# Patient Record
Sex: Female | Born: 1955
Health system: Southern US, Community
[De-identification: ages and names within clinical notes are randomized; demographics above are authoritative.]

## PROBLEM LIST (undated history)

## (undated) DIAGNOSIS — N6320 Unspecified lump in the left breast, unspecified quadrant: Secondary | ICD-10-CM

## (undated) DIAGNOSIS — I499 Cardiac arrhythmia, unspecified: Secondary | ICD-10-CM

## (undated) DIAGNOSIS — G4730 Sleep apnea, unspecified: Secondary | ICD-10-CM

## (undated) DIAGNOSIS — R0902 Hypoxemia: Secondary | ICD-10-CM

## (undated) DIAGNOSIS — H269 Unspecified cataract: Secondary | ICD-10-CM

## (undated) DIAGNOSIS — F419 Anxiety disorder, unspecified: Secondary | ICD-10-CM

## (undated) DIAGNOSIS — M2540 Effusion, unspecified joint: Secondary | ICD-10-CM

## (undated) DIAGNOSIS — R519 Headache, unspecified: Secondary | ICD-10-CM

## (undated) DIAGNOSIS — R922 Inconclusive mammogram: Secondary | ICD-10-CM

## (undated) DIAGNOSIS — M2550 Pain in unspecified joint: Secondary | ICD-10-CM

## (undated) DIAGNOSIS — R51 Headache: Secondary | ICD-10-CM

## (undated) DIAGNOSIS — G4700 Insomnia, unspecified: Secondary | ICD-10-CM

## (undated) DIAGNOSIS — M1990 Unspecified osteoarthritis, unspecified site: Secondary | ICD-10-CM

## (undated) DIAGNOSIS — K219 Gastro-esophageal reflux disease without esophagitis: Secondary | ICD-10-CM

## (undated) DIAGNOSIS — H8109 Meniere's disease, unspecified ear: Secondary | ICD-10-CM

## (undated) DIAGNOSIS — E7800 Pure hypercholesterolemia, unspecified: Secondary | ICD-10-CM

## (undated) HISTORY — DX: Insomnia, unspecified: G47.00

## (undated) HISTORY — DX: Pain in unspecified joint: M25.50

## (undated) HISTORY — PX: CHOLECYSTECTOMY: SHX55

## (undated) HISTORY — DX: Unspecified lump in the left breast, unspecified quadrant: N63.20

## (undated) HISTORY — PX: FRACTURE SURGERY: SHX138

## (undated) HISTORY — DX: Sleep apnea, unspecified: G47.30

## (undated) HISTORY — DX: Unspecified osteoarthritis, unspecified site: M19.90

## (undated) HISTORY — DX: Hypoxemia: R09.02

## (undated) HISTORY — DX: Inconclusive mammogram: R92.2

## (undated) HISTORY — PX: TOTAL SHOULDER REPLACEMENT: SUR1217

## (undated) HISTORY — PX: TUBAL LIGATION: SHX77

## (undated) HISTORY — PX: JOINT REPLACEMENT: SHX530

## (undated) HISTORY — DX: Unspecified cataract: H26.9

## (undated) HISTORY — DX: Pure hypercholesterolemia, unspecified: E78.00

## (undated) HISTORY — PX: ANKLE FRACTURE SURGERY: SHX122

## (undated) HISTORY — PX: BREAST LUMPECTOMY: SHX2

## (undated) HISTORY — DX: Effusion, unspecified joint: M25.40

---

## 2005-01-18 ENCOUNTER — Ambulatory Visit: Payer: Self-pay | Admitting: Unknown Physician Specialty

## 2005-02-09 ENCOUNTER — Ambulatory Visit: Payer: Self-pay | Admitting: Unknown Physician Specialty

## 2006-03-01 ENCOUNTER — Ambulatory Visit: Payer: Self-pay | Admitting: Unknown Physician Specialty

## 2007-01-31 DIAGNOSIS — E7800 Pure hypercholesterolemia, unspecified: Secondary | ICD-10-CM | POA: Insufficient documentation

## 2007-01-31 DIAGNOSIS — R000 Tachycardia, unspecified: Secondary | ICD-10-CM | POA: Insufficient documentation

## 2007-01-31 DIAGNOSIS — H81319 Aural vertigo, unspecified ear: Secondary | ICD-10-CM | POA: Insufficient documentation

## 2007-01-31 DIAGNOSIS — Z860100 Personal history of colon polyps, unspecified: Secondary | ICD-10-CM | POA: Insufficient documentation

## 2007-01-31 DIAGNOSIS — G43019 Migraine without aura, intractable, without status migrainosus: Secondary | ICD-10-CM | POA: Insufficient documentation

## 2007-01-31 HISTORY — DX: Pure hypercholesterolemia, unspecified: E78.00

## 2007-02-28 DIAGNOSIS — F32A Depression, unspecified: Secondary | ICD-10-CM | POA: Insufficient documentation

## 2007-03-21 ENCOUNTER — Ambulatory Visit: Payer: Self-pay | Admitting: Gastroenterology

## 2007-04-04 ENCOUNTER — Ambulatory Visit: Payer: Self-pay | Admitting: Unknown Physician Specialty

## 2010-03-17 ENCOUNTER — Ambulatory Visit: Payer: Self-pay | Admitting: Unknown Physician Specialty

## 2010-08-22 ENCOUNTER — Inpatient Hospital Stay: Payer: Self-pay | Admitting: Orthopedic Surgery

## 2010-12-20 ENCOUNTER — Emergency Department: Payer: Self-pay | Admitting: Emergency Medicine

## 2010-12-24 ENCOUNTER — Ambulatory Visit: Payer: Self-pay | Admitting: Internal Medicine

## 2013-07-17 ENCOUNTER — Ambulatory Visit: Payer: Self-pay | Admitting: Family Medicine

## 2013-07-31 ENCOUNTER — Ambulatory Visit: Payer: Self-pay | Admitting: Family Medicine

## 2014-04-23 LAB — HM PAP SMEAR: HM Pap smear: NEGATIVE

## 2014-07-08 LAB — HM COLONOSCOPY

## 2015-03-11 ENCOUNTER — Ambulatory Visit: Payer: Self-pay | Admitting: Podiatry

## 2015-03-19 ENCOUNTER — Ambulatory Visit: Payer: Self-pay | Admitting: Podiatry

## 2015-03-19 ENCOUNTER — Encounter: Payer: Self-pay | Admitting: Podiatry

## 2015-03-19 ENCOUNTER — Ambulatory Visit (INDEPENDENT_AMBULATORY_CARE_PROVIDER_SITE_OTHER): Payer: BLUE CROSS/BLUE SHIELD | Admitting: Podiatry

## 2015-03-19 ENCOUNTER — Ambulatory Visit (INDEPENDENT_AMBULATORY_CARE_PROVIDER_SITE_OTHER): Payer: BLUE CROSS/BLUE SHIELD

## 2015-03-19 VITALS — BP 110/69 | HR 91 | Resp 18

## 2015-03-19 DIAGNOSIS — M25572 Pain in left ankle and joints of left foot: Secondary | ICD-10-CM | POA: Diagnosis not present

## 2015-03-19 DIAGNOSIS — M7662 Achilles tendinitis, left leg: Secondary | ICD-10-CM

## 2015-03-19 MED ORDER — MELOXICAM 7.5 MG PO TABS: 7.5000 mg | ORAL_TABLET | Freq: Every day | ORAL | Status: DC

## 2015-03-19 NOTE — Progress Notes (Signed)
   Subjective:    Patient ID: Elaine Melendez, female    DOB: 05-06-1956, 59 y.o.   MRN: [id number]  HPI 59 year old female presents the office today for concerns of pain to her left ankle which started naproxen 2 weeks ago. She states that over the last couple weeks when the pain started her pain is significantly improved and she does not have much discomfort at this time however she'll have the area checked. She points to the Achilles tendon where she had the majority of her symptoms. She states that she was elevating the area as well as applying heat which help decrease the pain. She currently states that she'll he has mild pain to the area. She denies any history of injury or trauma. She denies any detailing or numbness. No other complaints at this time.   Review of Systems  Constitutional: Positive for fatigue.  HENT:       Dizziness Hearing loss or ringing   Musculoskeletal:       Swelling of the feet  All other systems reviewed and are negative.      Objective:   Physical Exam AAO x3, NAD DP/PT pulses palpable bilaterally, CRT less than 3 seconds Protective sensation intact with Simms Weinstein monofilament, vibratory sensation intact, Achilles tendon reflex intact There is mild discomfort along the Achilles tendon or along the midsubstance of the tendon. There is no tenderness to patient on the insertion of the calcaneus. There is no defect noted. Thompson test is negative. There is no overlying edema, erythema, increase in warmth. Ankle, subtalar, midtarsal joint range of motion is intact. There is no areas of pinpoint bony tenderness or pain the vibratory sensation. There is no overlying edema, erythema, increase in warmth. There is a decrease in medial arch height upon weightbearing. No other areas of tenderness to bilateral lower extremities. MMT 5/5, ROM WNL.  No open lesions or pre-ulcerative lesions.  No pain with calf compression, swelling, warmth, erythema bilaterally.       Assessment & Plan:   59 year old female left Achilles tendinitis  -X-rays were obtained and reviewed with the patient.  -Treatment options discussed including all alternatives, risks, and complications -Discussed etiology of her symptoms.  -Discussed stretching/rehabilitation exercises to help strengthen the Achilles tendon.  Prescribed mobic. Discussed side effects of the medication and directed to stop if any are to occur and call the office.  -Discussed shoe gear modifications.  -Follow-up in 4 weeks if symptoms continue or sooner if any problems arise. In the meantime, encouraged to call the office with any questions, concerns, change in symptoms.   Celesta Gentile, DPM

## 2015-03-19 NOTE — Patient Instructions (Signed)
Achilles Tendinitis   with Rehab  Achilles tendinitis is a disorder of the Achilles tendon. The Achilles tendon connects the large calf muscles (Gastrocnemius and Soleus) to the heel bone (calcaneus). This tendon is sometimes called the heel cord. It is important for pushing-off and standing on your toes and is important for walking, running, or jumping. Tendinitis is often caused by overuse and repetitive microtrauma.  SYMPTOMS  · Pain, tenderness, swelling, warmth, and redness may occur over the Achilles tendon even at rest.  · Pain with pushing off, or flexing or extending the ankle.  · Pain that is worsened after or during activity.  CAUSES   · Overuse sometimes seen with rapid increase in exercise programs or in sports requiring running and jumping.  · Poor physical conditioning (strength and flexibility or endurance).  · Running sports, especially training running down hills.  · Inadequate warm-up before practice or play or failure to stretch before participation.  · Injury to the tendon.  PREVENTION   · Warm up and stretch before practice or competition.  · Allow time for adequate rest and recovery between practices and competition.  · Keep up conditioning.  ¨ Keep up ankle and leg flexibility.  ¨ Improve or keep muscle strength and endurance.  ¨ Improve cardiovascular fitness.  · Use proper technique.  · Use proper equipment (shoes, skates).  · To help prevent recurrence, taping, protective strapping, or an adhesive bandage may be recommended for several weeks after healing is complete.  PROGNOSIS   · Recovery may take weeks to several months to heal.  · Longer recovery is expected if symptoms have been prolonged.  · Recovery is usually quicker if the inflammation is due to a direct blow as compared with overuse or sudden strain.  RELATED COMPLICATIONS   · Healing time will be prolonged if the condition is not correctly treated. The injury must be given plenty of time to heal.  · Symptoms can reoccur if  activity is resumed too soon.  · Untreated, tendinitis may increase the risk of tendon rupture requiring additional time for recovery and possibly surgery.  TREATMENT   · The first treatment consists of rest anti-inflammatory medication, and ice to relieve the pain.  · Stretching and strengthening exercises after resolution of pain will likely help reduce the risk of recurrence. Referral to a physical therapist or athletic trainer for further evaluation and treatment may be helpful.  · A walking boot or cast may be recommended to rest the Achilles tendon. This can help break the cycle of inflammation and microtrauma.  · Arch supports (orthotics) may be prescribed or recommended by your caregiver as an adjunct to therapy and rest.  · Surgery to remove the inflamed tendon lining or degenerated tendon tissue is rarely necessary and has shown less than predictable results.  MEDICATION   · Nonsteroidal anti-inflammatory medications, such as aspirin and ibuprofen, may be used for pain and inflammation relief. Do not take within 7 days before surgery. Take these as directed by your caregiver. Contact your caregiver immediately if any bleeding, stomach upset, or signs of allergic reaction occur. Other minor pain relievers, such as acetaminophen, may also be used.  · Pain relievers may be prescribed as necessary by your caregiver. Do not take prescription pain medication for longer than 4 to 7 days. Use only as directed and only as much as you need.  · Cortisone injections are rarely indicated. Cortisone injections may weaken tendons and predispose to rupture. It is better   to give the condition more time to heal than to use them.  HEAT AND COLD  · Cold is used to relieve pain and reduce inflammation for acute and chronic Achilles tendinitis. Cold should be applied for 10 to 15 minutes every 2 to 3 hours for inflammation and pain and immediately after any activity that aggravates your symptoms. Use ice packs or an ice  massage.  · Heat may be used before performing stretching and strengthening activities prescribed by your caregiver. Use a heat pack or a warm soak.  SEEK MEDICAL CARE IF:  · Symptoms get worse or do not improve in 2 weeks despite treatment.  · New, unexplained symptoms develop. Drugs used in treatment may produce side effects.  EXERCISES  RANGE OF MOTION (ROM) AND STRETCHING EXERCISES - Achilles Tendinitis   These exercises may help you when beginning to rehabilitate your injury. Your symptoms may resolve with or without further involvement from your physician, physical therapist or athletic trainer. While completing these exercises, remember:   · Restoring tissue flexibility helps normal motion to return to the joints. This allows healthier, less painful movement and activity.  · An effective stretch should be held for at least 30 seconds.  · A stretch should never be painful. You should only feel a gentle lengthening or release in the stretched tissue.  STRETCH - Gastroc, Standing   · Place hands on wall.  · Extend right / left leg, keeping the front knee somewhat bent.  · Slightly point your toes inward on your back foot.  · Keeping your right / left heel on the floor and your knee straight, shift your weight toward the wall, not allowing your back to arch.  · You should feel a gentle stretch in the right / left calf. Hold this position for __________ seconds.  Repeat __________ times. Complete this stretch __________ times per day.  STRETCH - Soleus, Standing   · Place hands on wall.  · Extend right / left leg, keeping the other knee somewhat bent.  · Slightly point your toes inward on your back foot.  · Keep your right / left heel on the floor, bend your back knee, and slightly shift your weight over the back leg so that you feel a gentle stretch deep in your back calf.  · Hold this position for __________ seconds.  Repeat __________ times. Complete this stretch __________ times per day.  STRETCH -  Gastrocsoleus, Standing   Note: This exercise can place a lot of stress on your foot and ankle. Please complete this exercise only if specifically instructed by your caregiver.   · Place the ball of your right / left foot on a step, keeping your other foot firmly on the same step.  · Hold on to the wall or a rail for balance.  · Slowly lift your other foot, allowing your body weight to press your heel down over the edge of the step.  · You should feel a stretch in your right / left calf.  · Hold this position for __________ seconds.  · Repeat this exercise with a slight bend in your knee.  Repeat __________ times. Complete this stretch __________ times per day.   STRENGTHENING EXERCISES - Achilles Tendinitis  These exercises may help you when beginning to rehabilitate your injury. They may resolve your symptoms with or without further involvement from your physician, physical therapist or athletic trainer. While completing these exercises, remember:   · Muscles can gain both the endurance   and the strength needed for everyday activities through controlled exercises.  · Complete these exercises as instructed by your physician, physical therapist or athletic trainer. Progress the resistance and repetitions only as guided.  · You may experience muscle soreness or fatigue, but the pain or discomfort you are trying to eliminate should never worsen during these exercises. If this pain does worsen, stop and make certain you are following the directions exactly. If the pain is still present after adjustments, discontinue the exercise until you can discuss the trouble with your clinician.  STRENGTH - Plantar-flexors   · Sit with your right / left leg extended. Holding onto both ends of a rubber exercise band/tubing, loop it around the ball of your foot. Keep a slight tension in the band.  · Slowly push your toes away from you, pointing them downward.  · Hold this position for __________ seconds. Return slowly, controlling the  tension in the band/tubing.  Repeat __________ times. Complete this exercise __________ times per day.   STRENGTH - Plantar-flexors   · Stand with your feet shoulder width apart. Steady yourself with a wall or table using as little support as needed.  · Keeping your weight evenly spread over the width of your feet, rise up on your toes.*  · Hold this position for __________ seconds.  Repeat __________ times. Complete this exercise __________ times per day.   *If this is too easy, shift your weight toward your right / left leg until you feel challenged. Ultimately, you may be asked to do this exercise with your right / left foot only.  STRENGTH - Plantar-flexors, Eccentric   Note: This exercise can place a lot of stress on your foot and ankle. Please complete this exercise only if specifically instructed by your caregiver.   · Place the balls of your feet on a step. With your hands, use only enough support from a wall or rail to keep your balance.  · Keep your knees straight and rise up on your toes.  · Slowly shift your weight entirely to your right / left toes and pick up your opposite foot. Gently and with controlled movement, lower your weight through your right / left foot so that your heel drops below the level of the step. You will feel a slight stretch in the back of your calf at the end position.  · Use the healthy leg to help rise up onto the balls of both feet, then lower weight only on the right / left leg again. Build up to 15 repetitions. Then progress to 3 consecutive sets of 15 repetitions.*  · After completing the above exercise, complete the same exercise with a slight knee bend (about 30 degrees). Again, build up to 15 repetitions. Then progress to 3 consecutive sets of 15 repetitions.*  Perform this exercise __________ times per day.   *When you easily complete 3 sets of 15, your physician, physical therapist or athletic trainer may advise you to add resistance by wearing a backpack filled with  additional weight.  STRENGTH - Plantar Flexors, Seated   · Sit on a chair that allows your feet to rest flat on the ground. If necessary, sit at the edge of the chair.  · Keeping your toes firmly on the ground, lift your right / left heel as far as you can without increasing any discomfort in your ankle.  Repeat __________ times. Complete this exercise __________ times a day.  *If instructed by your physician, physical therapist or athletic   trainer, you may add ____________________ of resistance by placing a weighted object on your right / left knee.  Document Released: 03/02/2005 Document Revised: 10/24/2011 Document Reviewed: 11/13/2008  Exit[employer]® Patient Information ©2015 [employer]. This information is not intended to replace advice given to you by your health care provider. Make sure you discuss any questions you have with your health care provider.

## 2015-04-16 ENCOUNTER — Ambulatory Visit: Payer: BLUE CROSS/BLUE SHIELD | Admitting: Podiatry

## 2016-01-05 DIAGNOSIS — R51 Headache: Secondary | ICD-10-CM | POA: Diagnosis not present

## 2016-01-05 DIAGNOSIS — G43719 Chronic migraine without aura, intractable, without status migrainosus: Secondary | ICD-10-CM | POA: Diagnosis not present

## 2016-01-05 DIAGNOSIS — G43019 Migraine without aura, intractable, without status migrainosus: Secondary | ICD-10-CM | POA: Diagnosis not present

## 2016-01-05 DIAGNOSIS — Z79899 Other long term (current) drug therapy: Secondary | ICD-10-CM | POA: Diagnosis not present

## 2016-03-28 DIAGNOSIS — L821 Other seborrheic keratosis: Secondary | ICD-10-CM | POA: Diagnosis not present

## 2016-03-28 DIAGNOSIS — L239 Allergic contact dermatitis, unspecified cause: Secondary | ICD-10-CM | POA: Diagnosis not present

## 2016-03-28 DIAGNOSIS — L820 Inflamed seborrheic keratosis: Secondary | ICD-10-CM | POA: Diagnosis not present

## 2016-03-28 DIAGNOSIS — D229 Melanocytic nevi, unspecified: Secondary | ICD-10-CM | POA: Diagnosis not present

## 2016-09-13 DIAGNOSIS — G43019 Migraine without aura, intractable, without status migrainosus: Secondary | ICD-10-CM | POA: Diagnosis not present

## 2016-09-13 DIAGNOSIS — G43719 Chronic migraine without aura, intractable, without status migrainosus: Secondary | ICD-10-CM | POA: Diagnosis not present

## 2016-12-01 DIAGNOSIS — H8109 Meniere's disease, unspecified ear: Secondary | ICD-10-CM | POA: Diagnosis not present

## 2016-12-01 DIAGNOSIS — R5381 Other malaise: Secondary | ICD-10-CM | POA: Diagnosis not present

## 2016-12-01 DIAGNOSIS — R0683 Snoring: Secondary | ICD-10-CM | POA: Diagnosis not present

## 2016-12-01 DIAGNOSIS — H90A22 Sensorineural hearing loss, unilateral, left ear, with restricted hearing on the contralateral side: Secondary | ICD-10-CM | POA: Diagnosis not present

## 2016-12-01 DIAGNOSIS — H6123 Impacted cerumen, bilateral: Secondary | ICD-10-CM | POA: Diagnosis not present

## 2016-12-01 DIAGNOSIS — R5383 Other fatigue: Secondary | ICD-10-CM | POA: Diagnosis not present

## 2016-12-07 DIAGNOSIS — R2681 Unsteadiness on feet: Secondary | ICD-10-CM | POA: Diagnosis not present

## 2016-12-07 DIAGNOSIS — R42 Dizziness and giddiness: Secondary | ICD-10-CM | POA: Diagnosis not present

## 2016-12-07 DIAGNOSIS — R2689 Other abnormalities of gait and mobility: Secondary | ICD-10-CM | POA: Diagnosis not present

## 2016-12-07 DIAGNOSIS — H8190 Unspecified disorder of vestibular function, unspecified ear: Secondary | ICD-10-CM | POA: Diagnosis not present

## 2016-12-19 DIAGNOSIS — R2681 Unsteadiness on feet: Secondary | ICD-10-CM | POA: Diagnosis not present

## 2016-12-19 DIAGNOSIS — R2689 Other abnormalities of gait and mobility: Secondary | ICD-10-CM | POA: Diagnosis not present

## 2016-12-19 DIAGNOSIS — R42 Dizziness and giddiness: Secondary | ICD-10-CM | POA: Diagnosis not present

## 2016-12-19 DIAGNOSIS — H8190 Unspecified disorder of vestibular function, unspecified ear: Secondary | ICD-10-CM | POA: Diagnosis not present

## 2016-12-26 DIAGNOSIS — H25813 Combined forms of age-related cataract, bilateral: Secondary | ICD-10-CM | POA: Diagnosis not present

## 2017-01-04 DIAGNOSIS — R2681 Unsteadiness on feet: Secondary | ICD-10-CM | POA: Diagnosis not present

## 2017-01-04 DIAGNOSIS — H8190 Unspecified disorder of vestibular function, unspecified ear: Secondary | ICD-10-CM | POA: Diagnosis not present

## 2017-01-04 DIAGNOSIS — R42 Dizziness and giddiness: Secondary | ICD-10-CM | POA: Diagnosis not present

## 2017-01-04 DIAGNOSIS — R2689 Other abnormalities of gait and mobility: Secondary | ICD-10-CM | POA: Diagnosis not present

## 2017-01-16 DIAGNOSIS — R2689 Other abnormalities of gait and mobility: Secondary | ICD-10-CM | POA: Diagnosis not present

## 2017-01-16 DIAGNOSIS — H8190 Unspecified disorder of vestibular function, unspecified ear: Secondary | ICD-10-CM | POA: Diagnosis not present

## 2017-01-16 DIAGNOSIS — R42 Dizziness and giddiness: Secondary | ICD-10-CM | POA: Diagnosis not present

## 2017-01-16 DIAGNOSIS — R2681 Unsteadiness on feet: Secondary | ICD-10-CM | POA: Diagnosis not present

## 2017-03-31 DIAGNOSIS — J069 Acute upper respiratory infection, unspecified: Secondary | ICD-10-CM | POA: Diagnosis not present

## 2017-04-04 ENCOUNTER — Encounter: Payer: Self-pay | Admitting: Family Medicine

## 2017-04-04 ENCOUNTER — Ambulatory Visit (INDEPENDENT_AMBULATORY_CARE_PROVIDER_SITE_OTHER): Payer: BLUE CROSS/BLUE SHIELD | Admitting: Family Medicine

## 2017-04-04 ENCOUNTER — Telehealth: Payer: Self-pay | Admitting: Family Medicine

## 2017-04-04 VITALS — BP 120/74 | HR 80 | Temp 97.5°F | Resp 16 | Wt 215.0 lb

## 2017-04-04 DIAGNOSIS — N6320 Unspecified lump in the left breast, unspecified quadrant: Secondary | ICD-10-CM

## 2017-04-04 DIAGNOSIS — B372 Candidiasis of skin and nail: Secondary | ICD-10-CM | POA: Diagnosis not present

## 2017-04-04 MED ORDER — NYSTATIN 100000 UNIT/GM EX POWD: Freq: Four times a day (QID) | CUTANEOUS | 1 refills | Status: DC

## 2017-04-04 NOTE — Patient Instructions (Signed)
Intertrigo Intertrigo is skin irritation (inflammation) that happens in warm, moist areas of the body. The irritation can cause a rash and make skin raw and itchy. The rash is usually pink or red. It happens mostly between folds of skin or where skin rubs together, such as:  Toes.  Armpits.  Groin.  Belly.  Breasts.  Buttocks.  This condition is not passed from person to person (is not contagious). Follow these instructions at home:  Keep the affected area clean and dry.  Do not scratch your skin.  Stay cool as much as possible. Use an air conditioner or fan, if you can.  Apply over-the-counter and prescription medicines only as told by your doctor.  If you were prescribed an antibiotic medicine, use it as told by your doctor. Do not stop using the antibiotic even if your condition starts to get better.  Keep all follow-up visits as told by your doctor. This is important. How is this prevented?  Stay at a healthy weight.  Keep your feet dry. This is very important if you have diabetes. Wear cotton or wool socks.  Take care of and protect the skin in your groin and butt area as told by your doctor.  Do not wear tight clothes. Wear clothes that: ? Are loose. ? Take away moisture from your body. ? Are made of cotton.  Wear a bra that gives good support, if needed.  Shower and dry yourself fully after being active.  Keep your blood sugar under control if you have diabetes. Contact a doctor if:  Your symptoms do not get better with treatment.  Your symptoms get worse or they spread.  You notice more redness and warmth.  You have a fever. This information is not intended to replace advice given to you by your health care provider. Make sure you discuss any questions you have with your health care provider. Document Released: 09/03/2010 Document Revised: 01/07/2016 Document Reviewed: 02/02/2015 Elsevier Interactive Patient Education  2018 [employer]  

## 2017-04-04 NOTE — Progress Notes (Signed)
Patient: Elaine Melendez Female    DOB: 02-04-1956   61 y.o.   MRN: [id number] Visit Date: 04/04/2017  Today's Provider: Lavon Paganini, MD   Chief Complaint  Patient presents with  . Breast Pain   Subjective:    HPI   Breast Pain Pt has noticed pain in her left breast x 4 days. She was exposed to strep the day prior her pain started (she saw ENT who tested for strep, which was negative). She did notice a rash on her breast prior to pain. The rash is red, streaky and itchy. The day after the rash the pain begun. The pain is described as "throbbing" and stabbing deep to nipple. She denies dimpling, nipple discharge, new breast mass (she has fibrocystic breasts). She has performed self breast exam, which was unchanged per pt. There is a family H/O breast cancer (maternal grandmother, paternal cousins, paternal aunt). Last mammogram was 07/28/2013 and was BI-RADS 2. Pt has tried Tylenol, without relief.  Pain is worse at night when not wearing a bra.  Allergies  Allergen Reactions  . Nickel      Current Outpatient Prescriptions:  .  meclizine (ANTIVERT) 12.5 MG tablet, Take 12.5 mg by mouth 3 (three) times daily as needed for dizziness., Disp: , Rfl:  .  potassium chloride (KLOR-CON) 20 MEQ packet, Take 20 mEq by mouth 2 (two) times daily., Disp: , Rfl:  .  promethazine (PHENERGAN) 25 MG tablet, Take 25 mg by mouth every 6 (six) hours as needed for nausea or vomiting., Disp: , Rfl:  .  triamterene-hydrochlorothiazide (DYAZIDE) 37.5-25 MG per capsule, Take 1 capsule by mouth daily., Disp: , Rfl:  .  venlafaxine (EFFEXOR) 37.5 MG tablet, Take 37.5 mg by mouth 2 (two) times daily., Disp: , Rfl:   Review of Systems  Constitutional: Negative for activity change, appetite change, chills, diaphoresis, fatigue, fever and unexpected weight change.  Respiratory: Negative.   Cardiovascular: Negative for chest pain, palpitations and leg swelling.  Endocrine: Negative.   Genitourinary:  Negative.        Breast pain  Skin: Positive for rash.    Social History  Substance Use Topics  . Smoking status: Never Smoker  . Smokeless tobacco: Never Used  . Alcohol use No   Objective:   BP 120/74 (BP Location: Left Arm, Patient Position: Sitting, Cuff Size: Large)   Pulse 80   Temp (!) 97.5 F (36.4 C) (Oral)   Resp 16   Wt 215 lb (97.5 kg)   LMP  (LMP Unknown)  Vitals:   04/04/17 1010  BP: 120/74  Pulse: 80  Resp: 16  Temp: (!) 97.5 F (36.4 C)  TempSrc: Oral  Weight: 215 lb (97.5 kg)     Physical Exam  Constitutional: She is oriented to person, place, and time. She appears well-developed and well-nourished. No distress.  HENT:  Head: Normocephalic and atraumatic.  Eyes: Conjunctivae are normal. No scleral icterus.  Cardiovascular: Normal rate and regular rhythm.   Pulmonary/Chest: Effort normal. No respiratory distress.  Breasts: right breast normal without mass, skin or nipple changes or axillary nodes, abnormal mass palpable deep to nipple of L breast that extends in the 9 oclock position, lactating, no erythema or tenderness, nipples normal.   Musculoskeletal: She exhibits no edema.  Neurological: She is alert and oriented to person, place, and time.  Skin: Skin is warm and dry.  Red rash under left breast c/w intertrigo  Psychiatric: She has a normal mood  and affect. Her behavior is normal.  Vitals reviewed.     Assessment & Plan:         1. Left breast lump - likely a cyst, but able to palpate a lump in L breast that is not present on R - MM DIAG BREAST TOMO BILATERAL - US BREAST COMPLETE UNI LEFT INC AXILLA - US BREAST COMPLETE UNI RIGHT INC AXILLA - f/u with results  2. Candidal intertrigo - rash c/w intertrigo - does not seem related to breast pain - treat with nystatin powder topically until resolution - return precautions discussed   The entirety of the information documented in the History of Present Illness, Review of Systems and  Physical Exam were personally obtained by me. Portions of this information were initially documented by Raquel Sarna Ratchford, CMA and reviewed by me for thoroughness and accuracy.    Lavon Paganini, MD  [facility]

## 2017-04-04 NOTE — Telephone Encounter (Signed)
Updated orders placed. 

## 2017-04-04 NOTE — Telephone Encounter (Signed)
[facility] is requesting that complete breast ultrasounds be changed to limited.[id number]

## 2017-04-11 DIAGNOSIS — G43019 Migraine without aura, intractable, without status migrainosus: Secondary | ICD-10-CM | POA: Diagnosis not present

## 2017-04-11 DIAGNOSIS — G43719 Chronic migraine without aura, intractable, without status migrainosus: Secondary | ICD-10-CM | POA: Diagnosis not present

## 2017-04-13 ENCOUNTER — Ambulatory Visit
Admission: RE | Admit: 2017-04-13 | Discharge: 2017-04-13 | Disposition: A | Discharge status: Home / Self Care | Payer: BLUE CROSS/BLUE SHIELD | Source: Ambulatory Visit | Attending: Family Medicine | Admitting: Family Medicine

## 2017-04-13 ENCOUNTER — Telehealth: Payer: Self-pay | Admitting: Family Medicine

## 2017-04-13 DIAGNOSIS — R509 Fever, unspecified: Secondary | ICD-10-CM | POA: Insufficient documentation

## 2017-04-13 DIAGNOSIS — Z803 Family history of malignant neoplasm of breast: Secondary | ICD-10-CM | POA: Diagnosis not present

## 2017-04-13 DIAGNOSIS — N6322 Unspecified lump in the left breast, upper inner quadrant: Secondary | ICD-10-CM | POA: Diagnosis not present

## 2017-04-13 DIAGNOSIS — R590 Localized enlarged lymph nodes: Secondary | ICD-10-CM | POA: Diagnosis not present

## 2017-04-13 DIAGNOSIS — N6320 Unspecified lump in the left breast, unspecified quadrant: Secondary | ICD-10-CM | POA: Insufficient documentation

## 2017-04-13 DIAGNOSIS — R922 Inconclusive mammogram: Secondary | ICD-10-CM | POA: Diagnosis not present

## 2017-04-13 DIAGNOSIS — N6324 Unspecified lump in the left breast, lower inner quadrant: Secondary | ICD-10-CM | POA: Diagnosis not present

## 2017-04-13 MED ORDER — AMOXICILLIN-POT CLAVULANATE 875-125 MG PO TABS: 1.0000 | ORAL_TABLET | Freq: Two times a day (BID) | ORAL | 0 refills | Status: DC

## 2017-04-13 NOTE — Telephone Encounter (Signed)
Pt advised and agrees with treatment plan. 2 week FU made.

## 2017-04-13 NOTE — Telephone Encounter (Signed)
Spoke to breast radiologist regarding mammogram and ultrasonography.  No discreet lumps found.  Enlarged lymph node noted in L axilla.  Wedge shaped area of redness found on R breast today.  Patient continues to have pain.  We discussed and will treat with course of antibiotics to treat for possible mastitis, which could explain enlarged lymph node as well.  Plan for repeat ultrasound including axilla in ~6 wks.  No biopsy at this time.  Could consider breast MRI in the future.  I will send a 10 day course of augmentin to pharmacy to treat for possible mastitis.  Please let patient know and schedule follow-up with me in 2 weeks for recheck.  Thanks!  Virginia Crews, MD, MPH [facility] 04/13/2017 3:28 PM

## 2017-04-27 ENCOUNTER — Encounter: Payer: Self-pay | Admitting: Family Medicine

## 2017-04-27 ENCOUNTER — Ambulatory Visit (INDEPENDENT_AMBULATORY_CARE_PROVIDER_SITE_OTHER): Payer: BLUE CROSS/BLUE SHIELD | Admitting: Family Medicine

## 2017-04-27 VITALS — BP 124/68 | HR 96 | Temp 97.5°F | Resp 16 | Wt 216.0 lb

## 2017-04-27 DIAGNOSIS — N61 Mastitis without abscess: Secondary | ICD-10-CM

## 2017-04-27 MED ORDER — SULFAMETHOXAZOLE-TRIMETHOPRIM 800-160 MG PO TABS: 1.0000 | ORAL_TABLET | Freq: Two times a day (BID) | ORAL | 0 refills | Status: DC

## 2017-04-27 NOTE — Patient Instructions (Signed)
Mastitis  Mastitis is inflammation of the breast tissue. It occurs most often in women who are breastfeeding, but it can also affect other women, and even sometimes men.  What are the causes?  Mastitis is usually caused by a bacterial infection. Bacteria enter the breast tissue through cuts or openings in the skin. Typically, this occurs with breastfeeding because of cracked or irritated skin. Sometimes, it can occur even when there is no opening in the skin. It can be associated with plugged milk (lactiferous) ducts. Nipple piercing can also lead to mastitis. Also, some forms of breast cancer can cause mastitis.  What are the signs or symptoms?  · Swelling, redness, tenderness, and pain in an area of the breast.  · Swelling of the glands under the arm on the same side.  · Fever.  If an infection is allowed to progress, a collection of pus (abscess) may develop.  How is this diagnosed?  Your health care provider can usually diagnose mastitis based on your symptoms and a physical exam. Tests may be done to help confirm the diagnosis. These may include:  · Removal of pus from the breast by applying pressure to the area. This pus can be examined in the lab to determine which bacteria are present. If an abscess has developed, the fluid in the abscess can be removed with a needle. This can also be used to confirm the diagnosis and determine the bacteria present. In most cases, pus will not be present.  · Blood tests to determine if your body is fighting a bacterial infection.  · Mammogram or ultrasound tests to rule out other problems or diseases.    How is this treated?  Antibiotic medicine is used to treat a bacterial infection. Your health care provider will determine which bacteria are most likely causing the infection and will select an appropriate antibiotic. This is sometimes changed based on the results of tests performed to identify the bacteria, or if there is no response to the antibiotic selected. Antibiotics  are usually given by mouth. You may also be given medicine for pain.  Mastitis that occurs with breastfeeding will sometimes go away on its own, so your health care provider may choose to wait 24 hours after first seeing you to decide whether a prescription medicine is needed.  Follow these instructions at home:  · Only take over-the-counter or prescription medicines for pain, fever, or discomfort as directed by your health care provider.  · If your health care provider prescribed an antibiotic, take the medicine as directed. Make sure you finish it even if you start to feel better.  · Do not wear a tight or underwire bra. Wear a soft, supportive bra.  · Increase your fluid intake, especially if you have a fever.  · Women who are breastfeeding should follow these instructions:  ? Continue to empty the breast. Your health care provider can tell you whether this milk is safe for your infant or needs to be thrown out. You may be told to stop nursing until your health care provider thinks it is safe for your baby. Use a breast pump if you are advised to stop nursing.  ? Keep your nipples clean and dry.  ? Empty the first breast completely before going to the other breast. If your baby is not emptying your breasts completely for some reason, use a breast pump to empty your breasts.  ? If you go back to work, pump your breasts while at work to stay   in time with your nursing schedule.  ? Avoid allowing your breasts to become overly filled with milk (engorged).  Contact a health care provider if:  · You have pus-like discharge from the breast.  · Your symptoms do not improve with the treatment prescribed by your health care provider within 2 days.  Get help right away if:  · Your pain and swelling are getting worse.  · You have pain that is not controlled with medicine.  · You have a red line extending from the breast toward your armpit.  · You have a fever or persistent symptoms for more than 2-3 days.  · You have a fever  and your symptoms suddenly get worse.  This information is not intended to replace advice given to you by your health care provider. Make sure you discuss any questions you have with your health care provider.  Document Released: 08/01/2005 Document Revised: 01/07/2016 Document Reviewed: 03/01/2013  Elsevier Interactive Patient Education © 2017 Elsevier Inc.

## 2017-04-27 NOTE — Progress Notes (Signed)
Patient: Elaine Melendez Female    DOB: [birth year]   61 y.o.   MRN: [id number] Visit Date: 04/27/2017  Today's Provider: Lavon Paganini, MD   Chief Complaint  Patient presents with  . Mastitis   Subjective:    HPI     Follow up for Mastitis  The patient was last seen for a likely cyst in her left breast 2 weeks ago. Changes made at last visit include ordering imaging, which showed an enlarged lymph node of the left axilla. Pt was treated with Augmentin for possible mastitis. Radiologist recommended a repeat US including axilla in 6 months.  She reports excellent compliance with treatment. She feels that condition is Unchanged. She has also noticed a red rash on her right breast that has been present since the night prior to the mammogram (this might be slightly better). She denies pain, itchiness.  She is having side effects. She states the abx caused yeast, but she used cortisone cream, with relief.  ------------------------------------------------------------------------------------     Allergies  Allergen Reactions  . Nickel      Current Outpatient Prescriptions:  .  meclizine (ANTIVERT) 12.5 MG tablet, Take 12.5 mg by mouth 3 (three) times daily as needed for dizziness., Disp: , Rfl:  .  nystatin (MYCOSTATIN/NYSTOP) powder, Apply topically 4 (four) times daily. To rash under breast, Disp: 15 g, Rfl: 1 .  Probiotic Product (PROBIOTIC PO), Take by mouth. AZO, Disp: , Rfl:  .  promethazine (PHENERGAN) 25 MG tablet, Take 25 mg by mouth every 6 (six) hours as needed for nausea or vomiting., Disp: , Rfl:  .  venlafaxine (EFFEXOR) 37.5 MG tablet, Take 37.5 mg by mouth 2 (two) times daily., Disp: , Rfl:  .  potassium chloride (KLOR-CON) 20 MEQ packet, Take 20 mEq by mouth 2 (two) times daily., Disp: , Rfl:  .  triamterene-hydrochlorothiazide (DYAZIDE) 37.5-25 MG per capsule, Take 1 capsule by mouth daily., Disp: , Rfl:   Review of Systems  Constitutional: Negative for  activity change, appetite change, chills, diaphoresis, fatigue, fever and unexpected weight change.  Respiratory: Negative for cough.   Cardiovascular: Negative for chest pain, palpitations and leg swelling.  Skin: Positive for rash.    Social History  Substance Use Topics  . Smoking status: Never Smoker  . Smokeless tobacco: Never Used  . Alcohol use No   Objective:   BP 124/68 (BP Location: Left Arm, Patient Position: Sitting, Cuff Size: Large)   Pulse 96   Temp (!) 97.5 F (36.4 C) (Oral)   Resp 16   Wt 216 lb (98 kg)   LMP  (LMP Unknown)  Vitals:   04/27/17 1602  BP: 124/68  Pulse: 96  Resp: 16  Temp: (!) 97.5 F (36.4 C)  TempSrc: Oral  Weight: 216 lb (98 kg)     Physical Exam  Constitutional: She is oriented to person, place, and time. She appears well-developed and well-nourished. No distress.  HENT:  Head: Normocephalic and atraumatic.  Cardiovascular: Normal rate and regular rhythm.   Pulmonary/Chest: Effort normal. No respiratory distress.  Breasts: breasts very dense.  No palpable lumps or axillary LAD.  Wedge shaped erythema with warmth on medial R breast without tenderness.   Neurological: She is alert and oriented to person, place, and time.  Skin: Skin is warm and dry.  Psychiatric: She has a normal mood and affect. Her behavior is normal.  Vitals reviewed.       Assessment & Plan:  1. Mastitis - failed treatment with augmentin - some concern for possible inflammatory breast cancer - will try 2nd course of abx therapy with bactrim to see if coverage was missed with augmentin - f/u after finishing abx and consider breast MRI - patient is scheduled for f/u US of L axilla LAD in 4 more weeks  Meds ordered this encounter  Medications  . Probiotic Product (PROBIOTIC PO)    Sig: Take by mouth. AZO  . sulfamethoxazole-trimethoprim (BACTRIM DS,SEPTRA DS) 800-160 MG tablet    Sig: Take 1 tablet by mouth 2 (two) times daily.    Dispense:  20  tablet    Refill:  0        The entirety of the information documented in the History of Present Illness, Review of Systems and Physical Exam were personally obtained by me. Portions of this information were initially documented by Raquel Sarna Ratchford, CMA and reviewed by me for thoroughness and accuracy.     Lavon Paganini, MD  [facility]

## 2017-05-10 ENCOUNTER — Ambulatory Visit (INDEPENDENT_AMBULATORY_CARE_PROVIDER_SITE_OTHER): Payer: BLUE CROSS/BLUE SHIELD | Admitting: Family Medicine

## 2017-05-10 ENCOUNTER — Encounter: Payer: Self-pay | Admitting: Family Medicine

## 2017-05-10 DIAGNOSIS — G4700 Insomnia, unspecified: Secondary | ICD-10-CM | POA: Insufficient documentation

## 2017-05-10 DIAGNOSIS — L539 Erythematous condition, unspecified: Secondary | ICD-10-CM | POA: Diagnosis not present

## 2017-05-10 HISTORY — DX: Insomnia, unspecified: G47.00

## 2017-05-10 NOTE — Progress Notes (Signed)
Patient: Elaine Melendez Female    DOB: 11/11/1955   61 y.o.   MRN: [id number] Visit Date: 05/10/2017  Today's Provider: Lavon Paganini, MD   Chief Complaint  Patient presents with  . Mastitis   Subjective:    HPI     Follow up for Mastitis  The patient was last seen for this 2 weeks ago. Changes made at last visit include starting second course of abx (bactrim) after failing Augmentin. Pt was also started on Nystatin powder for the affected area of intertrigo underneath breasts.  She reports excellent compliance with treatment. She has finished these medications. She feels that condition is Improved. She states the area is about 50% improved in the color and depth of redness.  No change in size of erythematous area.  No nipple discharge, breast pain, dimpling of skin. She is not having side effects. Has f/u appt with radiology for repeat L axillary [place] for lymphadenopathy on 10/11. ------------------------------------------------------------------------------------    Allergies  Allergen Reactions  . Nickel      Current Outpatient Prescriptions:  .  meclizine (ANTIVERT) 12.5 MG tablet, Take 12.5 mg by mouth 3 (three) times daily as needed for dizziness., Disp: , Rfl:  .  nystatin (MYCOSTATIN/NYSTOP) powder, Apply topically 4 (four) times daily. To rash under breast, Disp: 15 g, Rfl: 1 .  Probiotic Product (PROBIOTIC PO), Take by mouth. AZO, Disp: , Rfl:  .  promethazine (PHENERGAN) 25 MG tablet, Take 25 mg by mouth every 6 (six) hours as needed for nausea or vomiting., Disp: , Rfl:  .  venlafaxine (EFFEXOR) 37.5 MG tablet, Take 37.5 mg by mouth 2 (two) times daily., Disp: , Rfl:  .  potassium chloride (KLOR-CON) 20 MEQ packet, Take 20 mEq by mouth 2 (two) times daily., Disp: , Rfl:  .  triamterene-hydrochlorothiazide (DYAZIDE) 37.5-25 MG per capsule, Take 1 capsule by mouth daily., Disp: , Rfl:   Review of Systems  Constitutional: Negative for activity change,  appetite change, chills, diaphoresis, fatigue, fever and unexpected weight change.  HENT: Negative.   Respiratory: Negative.   Cardiovascular: Negative.   Gastrointestinal: Negative.   Genitourinary: Negative.   Skin: Positive for color change and rash.  Neurological: Negative.   Psychiatric/Behavioral: Negative.     Social History  Substance Use Topics  . Smoking status: Never Smoker  . Smokeless tobacco: Never Used  . Alcohol use No   Objective:   BP 136/74 (BP Location: Left Arm, Patient Position: Sitting, Cuff Size: Large)   Pulse 84   Temp 98 F (36.7 C) (Oral)   Resp 16   Wt 215 lb (97.5 kg)   LMP  (LMP Unknown)  Vitals:   05/10/17 1330  BP: 136/74  Pulse: 84  Resp: 16  Temp: 98 F (36.7 C)  TempSrc: Oral  Weight: 215 lb (97.5 kg)     Physical Exam  Constitutional: She is oriented to person, place, and time. She appears well-developed and well-nourished.  HENT:  Head: Normocephalic and atraumatic.  Eyes: Conjunctivae are normal. No scleral icterus.  Neck: Neck supple.  Cardiovascular: Normal rate, regular rhythm and normal heart sounds.   No murmur heard. Pulmonary/Chest: Effort normal and breath sounds normal. No respiratory distress. She has no wheezes. She has no rales.  Breasts:  left breast normal without mass, skin or nipple changes or axillary nodes. Right breast without mass, nipple changes, axillary nodes. Continues to have wedge-shaped area of erythema and is nontender to palpation over  medial right breast at the 3:00 position.  Musculoskeletal: She exhibits no edema.  Lymphadenopathy:    She has no cervical adenopathy.  Neurological: She is alert and oriented to person, place, and time.  Skin: Skin is warm and dry. No rash noted.  Psychiatric: She has a normal mood and affect. Her behavior is normal.  Vitals reviewed.       Assessment & Plan:      Problem List Items Addressed This Visit      Other   Breast erythema    Erythema of right  breast is now failed to improve despite 2 courses of adequate antibiotics No orange peel appearance or nipple changes Some concern for inflammatory breast cancer given lack of change when treated adequately for mastitis No palpable mass and no abnormalities of the right breast found on recent mammogram She is getting repeat ultrasound for some left axillary adenopathy that was found during diagnostic ultrasound. If this is still present, biopsy is recommended I will refer her to a breast surgeon today to discuss possible biopsy of this erythematous area of her right breast      Relevant Orders   Ambulatory referral to General Surgery          The entirety of the information documented in the History of Present Illness, Review of Systems and Physical Exam were personally obtained by me. Portions of this information were initially documented by Raquel Sarna Ratchford, CMA and reviewed by me for thoroughness and accuracy.     Lavon Paganini, MD  [facility]

## 2017-05-10 NOTE — Patient Instructions (Signed)
Breast Biopsy  A breast biopsy is a procedure in which a sample of suspicious breast tissue is removed from your breast. Following the procedure, the tissue or liquid that is removed from the breast is examined under a microscope to see if cancerous cells are present. You may need a breast biopsy if you have:  · Any undiagnosed breast mass (tumor).  · Nipple abnormalities, dimpling, crusting, or ulcerations.  · Abnormal discharge from the nipple, especially blood.  · Redness, swelling, and pain of the breast.  · Calcium deposits (calcifications) or abnormalities seen on a mammogram, ultrasound results, or MRI results.  · Suspicious changes in the breast seen on your mammogram.    If the breast abnormality is found to be cancerous (malignant), a breast biopsy can help to determine what the best treatment is for you. There are many different types of breast biopsies. Talk with your health care provider about your options and which type is best for you.  Tell a health care provider about:  · Any allergies you have.  · All medicines you are taking, including vitamins, herbs, eye drops, creams, and over-the-counter medicines.  · Any problems you or family members have had with anesthetic medicines.  · Any blood disorders you have.  · Any surgeries you have had.  · Any medical conditions you have.  · Whether you are pregnant or may be pregnant.  What are the risks?  Generally, this is a safe procedure. However, problems may occur, including:  · Bleeding.  · Infection.  · Discomfort. This is temporary.  · Allergic reactions to medicines.  · Bruising and swelling of the breast.  · Alteration in the shape of the breast.  · Damage to other tissues.  · Not finding the lump or abnormality.  · Needing more surgery.    What happens before the procedure?  · Plan to have someone take you home after the procedure.  · Do not use any tobacco products, such as cigarettes, chewing tobacco, and e-cigarettes. If you need help quitting,  ask your health care provider.  · Do not drink alcohol for 24 hours before the procedure.  · Ask your health care provider about:  ? Changing or stopping your regular medicines. This is especially important if you are taking diabetes medicines or blood thinners.  ? Taking medicines such as aspirin and ibuprofen. These medicines can thin your blood. Do not take these medicines before your procedure if your health care provider instructs you not to.  · Wear a good support bra to the procedure.  · Ask your health care provider how your surgical site will be marked or identified.  · You may be given antibiotic medicine to help prevent infection.  · Your health care provider may perform a procedure to place a wire (needle localization) or a seed that gives off radiation (radioactive seed localization) in the breast lump. A mammogram, ultrasound, MRI, or a combination of these techniques will be done during this procedure to identify the location of the breast abnormality. The imaging technique used will depend on the type of biopsy you are having. The wire or seed will help the health care provider locate the lump when performing the biopsy, especially if the lump cannot be felt.  What happens during the procedure?  You may be given one or both of the following:  · A medicine to numb the breast area (local anesthetic).  · A medicine to help you relax (sedative) during the procedure.      The following are the different types of biopsies that can be performed.  Fine-Needle Aspiration  A thin needle will be attached to a syringe and inserted into a breast cyst. Fluid and cells will be removed. This technique is not as common as a core needle biopsy.  Core Needle Biopsy  A wide, hollow needle (core needle) will be inserted into a breast lump multiple times to remove tissue samples or cores.  Stereotactic Biopsy  You will lie face-down on a table. Your breast will pass through an opening in the table and will be gently  compressed into a fixed position. X-ray equipment and a computer will be used to locate the breast lump. The surgeon will use this information to collect several samples of tissue using a needle collection device.  Vacuum-Assisted Biopsy  A small incision (less than ¼ inch) will be made in your breast. A biopsy device that includes a hollow needle and vacuum will be passed through the incision and into the breast tissue. The vacuum will gently draw abnormal breast tissue into the needle to remove it. No stitches (sutures) will be needed. The incision will be covered with a bandage (dressing). In this type of biopsy, a larger tissue sample is removed than in a regular core needle biopsy.  Ultrasound-Guided Core Needle Biopsy  A high-frequency ultrasound will be used to help guide the core needle to the area of the mass or abnormality. An incision will be made to insert the needle. Then tissue samples will be removed.  Surgical Biopsy  This method requires an incision in the breast to remove part or all of the suspicious tissue. After the tissue is removed, the skin over the area will be closed with sutures and covered with a dressing. There are two types of surgical biopsies:  · Incisional biopsy. The surgeon will remove part of the breast lump.  · Excisional biopsy. The surgeon will attempt to remove the whole breast lump or as much of it as possible.    After any of these procedures, the tissue or liquid that was removed will be examined under a microscope.  What happens after the procedure?  · You will be taken to the recovery area. If you are doing well and have no problems, you will be allowed to go home.  · You may notice bruising on your breast. This is normal.  · You may have a pressure dressing applied on your breast for 24-48 hours. A pressure dressing is a bandage that is wrapped tightly around the chest to stop fluid from collecting underneath tissues. You may also be advised to wear a supportive bra  during this time.  · Do not drive for 24 hours if you received a sedative.  This information is not intended to replace advice given to you by your health care provider. Make sure you discuss any questions you have with your health care provider.  Document Released: 08/01/2005 Document Revised: 12/10/2015 Document Reviewed: 05/05/2015  Elsevier Interactive Patient Education © 2018 [employer] Inc[employer]

## 2017-05-10 NOTE — Assessment & Plan Note (Signed)
Erythema of right breast is now failed to improve despite 2 courses of adequate antibiotics No orange peel appearance or nipple changes Some concern for inflammatory breast cancer given lack of change when treated adequately for mastitis No palpable mass and no abnormalities of the right breast found on recent mammogram She is getting repeat ultrasound for some left axillary adenopathy that was found during diagnostic ultrasound. If this is still present, biopsy is recommended I will refer her to a breast surgeon today to discuss possible biopsy of this erythematous area of her right breast

## 2017-05-15 DIAGNOSIS — D126 Benign neoplasm of colon, unspecified: Secondary | ICD-10-CM | POA: Diagnosis not present

## 2017-05-15 DIAGNOSIS — N61 Mastitis without abscess: Secondary | ICD-10-CM | POA: Diagnosis not present

## 2017-05-16 ENCOUNTER — Encounter: Payer: Self-pay | Admitting: Family Medicine

## 2017-05-25 ENCOUNTER — Other Ambulatory Visit: Payer: Self-pay | Admitting: Family Medicine

## 2017-05-25 ENCOUNTER — Ambulatory Visit
Admission: RE | Admit: 2017-05-25 | Discharge: 2017-05-25 | Disposition: A | Discharge status: Home / Self Care | Payer: BLUE CROSS/BLUE SHIELD | Source: Ambulatory Visit | Attending: Family Medicine | Admitting: Family Medicine

## 2017-05-25 DIAGNOSIS — Z1239 Encounter for other screening for malignant neoplasm of breast: Secondary | ICD-10-CM

## 2017-05-25 DIAGNOSIS — R922 Inconclusive mammogram: Secondary | ICD-10-CM

## 2017-05-25 DIAGNOSIS — N6320 Unspecified lump in the left breast, unspecified quadrant: Secondary | ICD-10-CM

## 2017-05-25 DIAGNOSIS — N644 Mastodynia: Secondary | ICD-10-CM

## 2017-05-25 DIAGNOSIS — N6489 Other specified disorders of breast: Secondary | ICD-10-CM | POA: Diagnosis not present

## 2017-05-25 DIAGNOSIS — L539 Erythematous condition, unspecified: Secondary | ICD-10-CM

## 2017-05-25 DIAGNOSIS — R9230 Dense breasts, unspecified: Secondary | ICD-10-CM

## 2017-05-25 HISTORY — DX: Dense breasts, unspecified: R92.30

## 2017-05-25 HISTORY — DX: Inconclusive mammogram: R92.2

## 2017-06-05 ENCOUNTER — Telehealth: Payer: Self-pay | Admitting: Family Medicine

## 2017-06-05 DIAGNOSIS — L539 Erythematous condition, unspecified: Secondary | ICD-10-CM

## 2017-06-05 NOTE — Telephone Encounter (Signed)
L/M advising patricia as below.  

## 2017-06-05 NOTE — Telephone Encounter (Signed)
Please review. Thanks!  

## 2017-06-05 NOTE — Telephone Encounter (Signed)
Mardene Celeste with [facility] [phone] called stating an order would need to be put in for [facility] with/without contrast order.  Mardene Celeste states she will leave the office at 130 today however, you can give a verbal on her voicemail or just change in EPIC.

## 2017-06-05 NOTE — Telephone Encounter (Signed)
New order placed in Epic.  Please call Mardene Celeste back with FYI to make sure there is nothing else we need to do.  Virginia Crews, MD, MPH [facility] 06/05/2017 2:55 PM

## 2017-06-16 DIAGNOSIS — I890 Lymphedema, not elsewhere classified: Secondary | ICD-10-CM | POA: Diagnosis not present

## 2017-06-16 DIAGNOSIS — N61 Mastitis without abscess: Secondary | ICD-10-CM | POA: Diagnosis not present

## 2017-06-21 ENCOUNTER — Other Ambulatory Visit: Payer: Self-pay | Admitting: Family Medicine

## 2017-06-21 ENCOUNTER — Ambulatory Visit
Admission: RE | Admit: 2017-06-21 | Discharge: 2017-06-21 | Disposition: A | Discharge status: Home / Self Care | Payer: BLUE CROSS/BLUE SHIELD | Source: Ambulatory Visit | Attending: Family Medicine | Admitting: Family Medicine

## 2017-06-21 DIAGNOSIS — L539 Erythematous condition, unspecified: Secondary | ICD-10-CM

## 2017-06-21 DIAGNOSIS — N6320 Unspecified lump in the left breast, unspecified quadrant: Secondary | ICD-10-CM

## 2017-06-21 DIAGNOSIS — N6489 Other specified disorders of breast: Secondary | ICD-10-CM | POA: Diagnosis not present

## 2017-06-21 DIAGNOSIS — N6342 Unspecified lump in left breast, subareolar: Secondary | ICD-10-CM | POA: Insufficient documentation

## 2017-06-21 HISTORY — DX: Unspecified lump in the left breast, unspecified quadrant: N63.20

## 2017-06-21 MED ORDER — GADOBENATE DIMEGLUMINE 529 MG/ML IV SOLN
20.0000 mL | Freq: Once | INTRAVENOUS | Status: AC | PRN
  Administered 2017-06-21: 20 mL via INTRAVENOUS

## 2017-06-23 ENCOUNTER — Other Ambulatory Visit: Payer: Self-pay | Admitting: Family Medicine

## 2017-06-23 DIAGNOSIS — N6320 Unspecified lump in the left breast, unspecified quadrant: Secondary | ICD-10-CM

## 2017-06-30 ENCOUNTER — Ambulatory Visit
Admission: RE | Admit: 2017-06-30 | Discharge: 2017-06-30 | Disposition: A | Discharge status: Home / Self Care | Payer: BLUE CROSS/BLUE SHIELD | Source: Ambulatory Visit | Attending: Family Medicine | Admitting: Family Medicine

## 2017-06-30 DIAGNOSIS — N6320 Unspecified lump in the left breast, unspecified quadrant: Secondary | ICD-10-CM

## 2017-06-30 DIAGNOSIS — D242 Benign neoplasm of left breast: Secondary | ICD-10-CM | POA: Diagnosis not present

## 2017-06-30 DIAGNOSIS — N6092 Unspecified benign mammary dysplasia of left breast: Secondary | ICD-10-CM | POA: Diagnosis not present

## 2017-06-30 DIAGNOSIS — N6489 Other specified disorders of breast: Secondary | ICD-10-CM | POA: Diagnosis not present

## 2017-06-30 MED ORDER — GADOBENATE DIMEGLUMINE 529 MG/ML IV SOLN
20.0000 mL | Freq: Once | INTRAVENOUS | Status: AC | PRN
  Administered 2017-06-30: 20 mL via INTRAVENOUS

## 2017-08-25 ENCOUNTER — Other Ambulatory Visit: Payer: Self-pay | Admitting: General Surgery

## 2017-08-25 DIAGNOSIS — R928 Other abnormal and inconclusive findings on diagnostic imaging of breast: Secondary | ICD-10-CM | POA: Diagnosis not present

## 2017-09-06 ENCOUNTER — Other Ambulatory Visit: Payer: Self-pay

## 2017-09-06 ENCOUNTER — Encounter (HOSPITAL_BASED_OUTPATIENT_CLINIC_OR_DEPARTMENT_OTHER): Payer: Self-pay | Admitting: *Deleted

## 2017-09-06 DIAGNOSIS — D1801 Hemangioma of skin and subcutaneous tissue: Secondary | ICD-10-CM | POA: Diagnosis not present

## 2017-09-06 DIAGNOSIS — M25549 Pain in joints of unspecified hand: Secondary | ICD-10-CM | POA: Diagnosis not present

## 2017-09-06 DIAGNOSIS — L905 Scar conditions and fibrosis of skin: Secondary | ICD-10-CM | POA: Diagnosis not present

## 2017-09-07 ENCOUNTER — Encounter (HOSPITAL_BASED_OUTPATIENT_CLINIC_OR_DEPARTMENT_OTHER)
Admission: RE | Admit: 2017-09-07 | Discharge: 2017-09-07 | Disposition: A | Discharge status: Home / Self Care | Payer: BLUE CROSS/BLUE SHIELD | Source: Ambulatory Visit | Attending: General Surgery | Admitting: General Surgery

## 2017-09-07 DIAGNOSIS — R9431 Abnormal electrocardiogram [ECG] [EKG]: Secondary | ICD-10-CM | POA: Insufficient documentation

## 2017-09-07 DIAGNOSIS — R000 Tachycardia, unspecified: Secondary | ICD-10-CM | POA: Diagnosis not present

## 2017-09-07 LAB — BASIC METABOLIC PANEL
Anion gap: 10 (ref 5–15)
BUN: 8 mg/dL (ref 6–20)
CALCIUM: 9.2 mg/dL (ref 8.9–10.3)
CO2: 28 mmol/L (ref 22–32)
CREATININE: 0.65 mg/dL (ref 0.44–1.00)
Chloride: 103 mmol/L (ref 101–111)
GFR calc non Af Amer: >60 mL/min (ref >60)
Glucose, Bld: 116 mg/dL — ABNORMAL HIGH (ref 65–99)
Potassium: 3.2 mmol/L — ABNORMAL LOW (ref 3.5–5.1)
SODIUM: 141 mmol/L (ref 135–145)

## 2017-09-07 NOTE — Progress Notes (Addendum)
Ensure pre surgery drink given with instructions to complete by [facility], pt verbalized understanding.  EKG and labs reviewed by Dr. Gifford Shave, will proceed with surgery as scheduled.

## 2017-09-11 ENCOUNTER — Ambulatory Visit
Admission: RE | Admit: 2017-09-11 | Discharge: 2017-09-11 | Disposition: A | Discharge status: Home / Self Care | Payer: BLUE CROSS/BLUE SHIELD | Source: Ambulatory Visit | Attending: General Surgery | Admitting: General Surgery

## 2017-09-11 DIAGNOSIS — R928 Other abnormal and inconclusive findings on diagnostic imaging of breast: Secondary | ICD-10-CM

## 2017-09-11 DIAGNOSIS — D242 Benign neoplasm of left breast: Secondary | ICD-10-CM | POA: Diagnosis not present

## 2017-09-11 DIAGNOSIS — N6082 Other benign mammary dysplasias of left breast: Secondary | ICD-10-CM | POA: Diagnosis not present

## 2017-09-12 ENCOUNTER — Ambulatory Visit
Admission: RE | Admit: 2017-09-12 | Discharge: 2017-09-12 | Disposition: A | Discharge status: Home / Self Care | Payer: BLUE CROSS/BLUE SHIELD | Source: Ambulatory Visit | Attending: General Surgery | Admitting: General Surgery

## 2017-09-12 ENCOUNTER — Other Ambulatory Visit: Payer: Self-pay

## 2017-09-12 ENCOUNTER — Encounter (HOSPITAL_BASED_OUTPATIENT_CLINIC_OR_DEPARTMENT_OTHER): Payer: Self-pay | Admitting: *Deleted

## 2017-09-12 ENCOUNTER — Encounter (HOSPITAL_BASED_OUTPATIENT_CLINIC_OR_DEPARTMENT_OTHER)
Admission: RE | Disposition: A | Discharge status: Home / Self Care | Payer: Self-pay | Source: Ambulatory Visit | Attending: General Surgery

## 2017-09-12 ENCOUNTER — Ambulatory Visit (HOSPITAL_BASED_OUTPATIENT_CLINIC_OR_DEPARTMENT_OTHER)
Admission: RE | Admit: 2017-09-12 | Discharge: 2017-09-12 | Disposition: A | Discharge status: Home / Self Care | Payer: BLUE CROSS/BLUE SHIELD | Source: Ambulatory Visit | Attending: General Surgery | Admitting: General Surgery

## 2017-09-12 ENCOUNTER — Ambulatory Visit (HOSPITAL_BASED_OUTPATIENT_CLINIC_OR_DEPARTMENT_OTHER): Payer: BLUE CROSS/BLUE SHIELD | Admitting: Anesthesiology

## 2017-09-12 DIAGNOSIS — R928 Other abnormal and inconclusive findings on diagnostic imaging of breast: Secondary | ICD-10-CM

## 2017-09-12 DIAGNOSIS — F419 Anxiety disorder, unspecified: Secondary | ICD-10-CM | POA: Diagnosis not present

## 2017-09-12 DIAGNOSIS — K219 Gastro-esophageal reflux disease without esophagitis: Secondary | ICD-10-CM | POA: Diagnosis not present

## 2017-09-12 DIAGNOSIS — N6022 Fibroadenosis of left breast: Secondary | ICD-10-CM | POA: Diagnosis not present

## 2017-09-12 DIAGNOSIS — R51 Headache: Secondary | ICD-10-CM | POA: Insufficient documentation

## 2017-09-12 DIAGNOSIS — N6012 Diffuse cystic mastopathy of left breast: Secondary | ICD-10-CM | POA: Diagnosis not present

## 2017-09-12 DIAGNOSIS — E669 Obesity, unspecified: Secondary | ICD-10-CM | POA: Diagnosis not present

## 2017-09-12 DIAGNOSIS — R921 Mammographic calcification found on diagnostic imaging of breast: Secondary | ICD-10-CM | POA: Diagnosis not present

## 2017-09-12 DIAGNOSIS — N6082 Other benign mammary dysplasias of left breast: Secondary | ICD-10-CM | POA: Diagnosis not present

## 2017-09-12 DIAGNOSIS — H8102 Meniere's disease, left ear: Secondary | ICD-10-CM | POA: Diagnosis not present

## 2017-09-12 DIAGNOSIS — L539 Erythematous condition, unspecified: Secondary | ICD-10-CM | POA: Diagnosis not present

## 2017-09-12 HISTORY — DX: Anxiety disorder, unspecified: F41.9

## 2017-09-12 HISTORY — DX: Headache, unspecified: R51.9

## 2017-09-12 HISTORY — DX: Meniere's disease, unspecified ear: H81.09

## 2017-09-12 HISTORY — PX: RADIOACTIVE SEED GUIDED EXCISIONAL BREAST BIOPSY: SHX6490

## 2017-09-12 HISTORY — DX: Gastro-esophageal reflux disease without esophagitis: K21.9

## 2017-09-12 HISTORY — DX: Cardiac arrhythmia, unspecified: I49.9

## 2017-09-12 HISTORY — DX: Headache: R51

## 2017-09-12 SURGERY — RADIOACTIVE SEED GUIDED BREAST BIOPSY
Anesthesia: General | Site: Breast | Laterality: Left

## 2017-09-12 MED ORDER — ACETAMINOPHEN 500 MG PO TABS
1000.0000 mg | ORAL_TABLET | ORAL | Status: AC
  Administered 2017-09-12: 1000 mg via ORAL

## 2017-09-12 MED ORDER — OXYCODONE HCL 5 MG PO TABS: 5.0000 mg | ORAL_TABLET | Freq: Once | ORAL | Status: DC | PRN

## 2017-09-12 MED ORDER — LIDOCAINE HCL (PF) 1 % IJ SOLN
INTRAMUSCULAR | Status: DC | PRN
  Administered 2017-09-12: 10 mL

## 2017-09-12 MED ORDER — BUPIVACAINE-EPINEPHRINE 0.5% -1:200000 IJ SOLN
INTRAMUSCULAR | Status: DC | PRN
  Administered 2017-09-12: 10 mL

## 2017-09-12 MED ORDER — FENTANYL CITRATE (PF) 100 MCG/2ML IJ SOLN
INTRAMUSCULAR | Status: AC
  Filled 2017-09-12: qty 2

## 2017-09-12 MED ORDER — CHLORHEXIDINE GLUCONATE CLOTH 2 % EX PADS: 6.0000 | MEDICATED_PAD | Freq: Once | CUTANEOUS | Status: DC

## 2017-09-12 MED ORDER — SODIUM CHLORIDE 0.9% FLUSH: 3.0000 mL | INTRAVENOUS | Status: DC | PRN

## 2017-09-12 MED ORDER — LACTATED RINGERS IV SOLN
INTRAVENOUS | Status: DC
  Administered 2017-09-12: 08:00:00 via INTRAVENOUS

## 2017-09-12 MED ORDER — MIDAZOLAM HCL 5 MG/5ML IJ SOLN
INTRAMUSCULAR | Status: DC | PRN
  Administered 2017-09-12: 2 mg via INTRAVENOUS

## 2017-09-12 MED ORDER — MIDAZOLAM HCL 2 MG/2ML IJ SOLN
INTRAMUSCULAR | Status: AC
  Filled 2017-09-12: qty 2

## 2017-09-12 MED ORDER — OXYCODONE HCL 5 MG/5ML PO SOLN: 5.0000 mg | Freq: Once | ORAL | Status: DC | PRN

## 2017-09-12 MED ORDER — LIDOCAINE 2% (20 MG/ML) 5 ML SYRINGE
INTRAMUSCULAR | Status: DC | PRN
  Administered 2017-09-12: 100 mg via INTRAVENOUS

## 2017-09-12 MED ORDER — CEFAZOLIN SODIUM-DEXTROSE 2-4 GM/100ML-% IV SOLN
INTRAVENOUS | Status: AC
  Filled 2017-09-12: qty 100

## 2017-09-12 MED ORDER — OXYCODONE HCL 5 MG PO TABS: 5.0000 mg | ORAL_TABLET | ORAL | Status: DC | PRN

## 2017-09-12 MED ORDER — LIDOCAINE 2% (20 MG/ML) 5 ML SYRINGE
INTRAMUSCULAR | Status: AC
  Filled 2017-09-12: qty 5

## 2017-09-12 MED ORDER — ONDANSETRON HCL 4 MG/2ML IJ SOLN
INTRAMUSCULAR | Status: AC
  Filled 2017-09-12: qty 2

## 2017-09-12 MED ORDER — DEXAMETHASONE SODIUM PHOSPHATE 10 MG/ML IJ SOLN
INTRAMUSCULAR | Status: DC | PRN
  Administered 2017-09-12: 10 mg via INTRAVENOUS

## 2017-09-12 MED ORDER — CELECOXIB 200 MG PO CAPS
200.0000 mg | ORAL_CAPSULE | ORAL | Status: AC
  Administered 2017-09-12: 200 mg via ORAL

## 2017-09-12 MED ORDER — MIDAZOLAM HCL 2 MG/2ML IJ SOLN: 1.0000 mg | INTRAMUSCULAR | Status: DC | PRN

## 2017-09-12 MED ORDER — PROPOFOL 10 MG/ML IV BOLUS
INTRAVENOUS | Status: AC
  Filled 2017-09-12: qty 20

## 2017-09-12 MED ORDER — ONDANSETRON HCL 4 MG/2ML IJ SOLN: 4.0000 mg | Freq: Once | INTRAMUSCULAR | Status: DC | PRN

## 2017-09-12 MED ORDER — FENTANYL CITRATE (PF) 100 MCG/2ML IJ SOLN
INTRAMUSCULAR | Status: DC | PRN
  Administered 2017-09-12: 25 ug via INTRAVENOUS
  Administered 2017-09-12: 50 ug via INTRAVENOUS
  Administered 2017-09-12: 25 ug via INTRAVENOUS

## 2017-09-12 MED ORDER — CEFAZOLIN SODIUM-DEXTROSE 2-4 GM/100ML-% IV SOLN
2.0000 g | INTRAVENOUS | Status: AC
  Administered 2017-09-12: 2 g via INTRAVENOUS

## 2017-09-12 MED ORDER — DEXAMETHASONE SODIUM PHOSPHATE 10 MG/ML IJ SOLN
INTRAMUSCULAR | Status: AC
  Filled 2017-09-12: qty 1

## 2017-09-12 MED ORDER — ONDANSETRON HCL 4 MG/2ML IJ SOLN
INTRAMUSCULAR | Status: DC | PRN
  Administered 2017-09-12: 4 mg via INTRAVENOUS

## 2017-09-12 MED ORDER — ACETAMINOPHEN 325 MG PO TABS: 650.0000 mg | ORAL_TABLET | ORAL | Status: DC | PRN

## 2017-09-12 MED ORDER — BUPIVACAINE-EPINEPHRINE (PF) 0.5% -1:200000 IJ SOLN
INTRAMUSCULAR | Status: AC
  Filled 2017-09-12: qty 90

## 2017-09-12 MED ORDER — LIDOCAINE HCL (PF) 1 % IJ SOLN
INTRAMUSCULAR | Status: AC
  Filled 2017-09-12: qty 90

## 2017-09-12 MED ORDER — FENTANYL CITRATE (PF) 100 MCG/2ML IJ SOLN: 50.0000 ug | INTRAMUSCULAR | Status: DC | PRN

## 2017-09-12 MED ORDER — ACETAMINOPHEN 650 MG RE SUPP: 650.0000 mg | RECTAL | Status: DC | PRN

## 2017-09-12 MED ORDER — SODIUM CHLORIDE 0.9% FLUSH: 3.0000 mL | Freq: Two times a day (BID) | INTRAVENOUS | Status: DC

## 2017-09-12 MED ORDER — ACETAMINOPHEN 500 MG PO TABS
ORAL_TABLET | ORAL | Status: AC
  Filled 2017-09-12: qty 2

## 2017-09-12 MED ORDER — CELECOXIB 200 MG PO CAPS
ORAL_CAPSULE | ORAL | Status: AC
Start: 2017-09-12 — End: ?
  Filled 2017-09-12: qty 1

## 2017-09-12 MED ORDER — EPHEDRINE SULFATE-NACL 50-0.9 MG/10ML-% IV SOSY
PREFILLED_SYRINGE | INTRAVENOUS | Status: DC | PRN
  Administered 2017-09-12: 20 mg via INTRAVENOUS

## 2017-09-12 MED ORDER — SCOPOLAMINE 1 MG/3DAYS TD PT72
1.0000 | MEDICATED_PATCH | Freq: Once | TRANSDERMAL | Status: DC | PRN
Start: 2017-09-12 — End: 2017-09-12
  Administered 2017-09-12: 1.5 mg via TRANSDERMAL

## 2017-09-12 MED ORDER — PROPOFOL 10 MG/ML IV BOLUS
INTRAVENOUS | Status: DC | PRN
  Administered 2017-09-12: 180 mg via INTRAVENOUS

## 2017-09-12 MED ORDER — OXYCODONE HCL 5 MG PO TABS: 5.0000 mg | ORAL_TABLET | Freq: Four times a day (QID) | ORAL | 0 refills | Status: DC | PRN

## 2017-09-12 MED ORDER — GABAPENTIN 300 MG PO CAPS
ORAL_CAPSULE | ORAL | Status: AC
  Filled 2017-09-12: qty 1

## 2017-09-12 MED ORDER — GABAPENTIN 300 MG PO CAPS
300.0000 mg | ORAL_CAPSULE | ORAL | Status: AC
  Administered 2017-09-12: 300 mg via ORAL

## 2017-09-12 MED ORDER — METHYLENE BLUE 0.5 % INJ SOLN
INTRAVENOUS | Status: AC
  Filled 2017-09-12: qty 10

## 2017-09-12 MED ORDER — EPHEDRINE 5 MG/ML INJ
INTRAVENOUS | Status: AC
  Filled 2017-09-12: qty 10

## 2017-09-12 MED ORDER — SODIUM CHLORIDE 0.9 % IV SOLN: 250.0000 mL | INTRAVENOUS | Status: DC | PRN

## 2017-09-12 MED ORDER — SODIUM CHLORIDE 0.9 % IJ SOLN
INTRAMUSCULAR | Status: AC
  Filled 2017-09-12: qty 10

## 2017-09-12 MED ORDER — SCOPOLAMINE 1 MG/3DAYS TD PT72
MEDICATED_PATCH | TRANSDERMAL | Status: AC
  Filled 2017-09-12: qty 1

## 2017-09-12 MED ORDER — FENTANYL CITRATE (PF) 100 MCG/2ML IJ SOLN
25.0000 ug | INTRAMUSCULAR | Status: DC | PRN
  Administered 2017-09-12: 50 ug via INTRAVENOUS
  Administered 2017-09-12: 25 ug via INTRAVENOUS

## 2017-09-12 SURGICAL SUPPLY — 53 items
BINDER BREAST XXLRG (GAUZE/BANDAGES/DRESSINGS) ×2 IMPLANT
BLADE SURG 10 STRL SS (BLADE) ×2 IMPLANT
BLADE SURG 15 STRL LF DISP TIS (BLADE) IMPLANT
BLADE SURG 15 STRL SS (BLADE)
CANISTER SUC SOCK COL 7IN (MISCELLANEOUS) IMPLANT
CANISTER SUCT 1200ML W/VALVE (MISCELLANEOUS) ×2 IMPLANT
CHLORAPREP W/TINT 26ML (MISCELLANEOUS) ×2 IMPLANT
CLIP VESOCCLUDE LG 6/CT (CLIP) ×2 IMPLANT
COVER BACK TABLE 60X90IN (DRAPES) ×2 IMPLANT
COVER MAYO STAND STRL (DRAPES) ×2 IMPLANT
COVER PROBE W GEL 5X96 (DRAPES) ×2 IMPLANT
DECANTER SPIKE VIAL GLASS SM (MISCELLANEOUS) IMPLANT
DERMABOND ADVANCED (GAUZE/BANDAGES/DRESSINGS) ×1
DERMABOND ADVANCED .7 DNX12 (GAUZE/BANDAGES/DRESSINGS) ×1 IMPLANT
DEVICE DUBIN W/COMP PLATE 8390 (MISCELLANEOUS) ×2 IMPLANT
DRAPE LAPAROSCOPIC ABDOMINAL (DRAPES) ×2 IMPLANT
DRAPE UTILITY XL STRL (DRAPES) ×2 IMPLANT
ELECT COATED BLADE 2.86 ST (ELECTRODE) ×2 IMPLANT
ELECT REM PT RETURN 9FT ADLT (ELECTROSURGICAL) ×2
ELECTRODE REM PT RTRN 9FT ADLT (ELECTROSURGICAL) ×1 IMPLANT
GAUZE SPONGE 4X4 12PLY STRL LF (GAUZE/BANDAGES/DRESSINGS) ×2 IMPLANT
GLOVE BIO SURGEON STRL SZ 6 (GLOVE) ×2 IMPLANT
GLOVE BIO SURGEON STRL SZ 6.5 (GLOVE) ×2 IMPLANT
GLOVE BIOGEL PI IND STRL 6.5 (GLOVE) ×1 IMPLANT
GLOVE BIOGEL PI IND STRL 7.0 (GLOVE) ×1 IMPLANT
GLOVE BIOGEL PI INDICATOR 6.5 (GLOVE) ×1
GLOVE BIOGEL PI INDICATOR 7.0 (GLOVE) ×1
GOWN STRL REUS W/ TWL LRG LVL3 (GOWN DISPOSABLE) ×1 IMPLANT
GOWN STRL REUS W/TWL 2XL LVL3 (GOWN DISPOSABLE) ×2 IMPLANT
GOWN STRL REUS W/TWL LRG LVL3 (GOWN DISPOSABLE) ×1
KIT MARKER MARGIN INK (KITS) ×2 IMPLANT
LIGHT WAVEGUIDE WIDE FLAT (MISCELLANEOUS) ×2 IMPLANT
NEEDLE HYPO 25X1 1.5 SAFETY (NEEDLE) ×2 IMPLANT
NS IRRIG 1000ML POUR BTL (IV SOLUTION) ×2 IMPLANT
PACK BASIN DAY SURGERY FS (CUSTOM PROCEDURE TRAY) ×2 IMPLANT
PENCIL BUTTON HOLSTER BLD 10FT (ELECTRODE) ×2 IMPLANT
SLEEVE SCD COMPRESS KNEE MED (MISCELLANEOUS) ×2 IMPLANT
SLEEVE SURGEON STRL (DRAPES) ×2 IMPLANT
SPONGE LAP 18X18 X RAY DECT (DISPOSABLE) ×2 IMPLANT
STAPLER VISISTAT 35W (STAPLE) IMPLANT
STRIP CLOSURE SKIN 1/2X4 (GAUZE/BANDAGES/DRESSINGS) ×2 IMPLANT
SUT MON AB 4-0 PC3 18 (SUTURE) ×2 IMPLANT
SUT SILK 2 0 SH (SUTURE) IMPLANT
SUT VIC AB 2-0 SH 18 (SUTURE) IMPLANT
SUT VIC AB 3-0 SH 27 (SUTURE) ×1
SUT VIC AB 3-0 SH 27X BRD (SUTURE) ×1 IMPLANT
SUT VICRYL 3-0 CR8 SH (SUTURE) IMPLANT
SYR BULB 3OZ (MISCELLANEOUS) ×2 IMPLANT
SYR CONTROL 10ML LL (SYRINGE) ×2 IMPLANT
TOWEL OR 17X24 6PK STRL BLUE (TOWEL DISPOSABLE) ×2 IMPLANT
TOWEL OR NON WOVEN STRL DISP B (DISPOSABLE) IMPLANT
TUBE CONNECTING 20X1/4 (TUBING) ×2 IMPLANT
YANKAUER SUCT BULB TIP NO VENT (SUCTIONS) ×2 IMPLANT

## 2017-09-12 NOTE — Transfer of Care (Signed)
Immediate Anesthesia Transfer of Care Note  Patient: Elaine Melendez  Procedure(s) Performed: 2 RADIOACTIVE SEED GUIDED EXCISIONAL LEFT BREAST BIOPSY ERAS PATHWAY (Left Breast)  Patient Location: PACU  Anesthesia Type:General  Level of Consciousness: awake, alert  and oriented  Airway & Oxygen Therapy: Patient Spontanous Breathing and Patient connected to face mask oxygen  Post-op Assessment: Report given to RN and Post -op Vital signs reviewed and stable  Post vital signs: Reviewed and stable  Last Vitals:  Vitals:   09/12/17 0745  BP: 129/65  Pulse: 78  Resp: 18  Temp: 36.8 C  SpO2: 98%    Last Pain:  Vitals:   09/12/17 0745  TempSrc: Oral      Patients Stated Pain Goal: 0 (08/05/40 1464)  Complications: No apparent anesthesia complications

## 2017-09-12 NOTE — H&P (Signed)
Elaine Melendez Documented: 08/25/2017 8:59 AM Location: [facility] Patient #: [id number] DOB: [birth year] Married / Language: English / Race: White Female   History of Present Illness Elaine Klein MD; 08/25/2017 9:30 AM) The patient is a 62 year old female who presents for a follow-up for Breast problems. Patient is a 62 year old female who returns following a breast MRI. Shee had persistent right breast erythema. Given her breast density category D and the persistent skin thickening, a breast MRI was ordered. This showed a normal right breast, but 2 suspicious lesions on the left. These were biopsied and 1 of them showed an intraductal papilloma. The other showed a complex sclerosing lesion with atypical ductal hyperplasia. She presents for excision. She is not having breast pain. She is eager to get this done. The remainder of her history is unchanged. Interestingly, the right breast redness has improved and is nearly resolved.    Dx mammogram 04/13/17  ACR Breast Density Category d: The breast tissue is extremely dense, which lowers the sensitivity of mammography. FINDINGS: There is a prominent left axillary lymph node. No suspicious mass, calcifications, or other abnormality is identified within either breast. Evaluation is limited by the patient's extremely dense breast tissue.  Mammographic images were processed with CAD.  On physical exam, there is a wedge-shaped area of erythema in the medial right breast. No underlying mass is felt. No discrete mass is felt in the area of pain in the subareolar and periareolar left breast or in the 9 o'clock region.  Targeted ultrasound of the area of erythema on the medial right breast was performed demonstrating no suspicious cystic or solid sonographic finding. No fluid collection to suggest abscess was identified.  Targeted ultrasound of the central, subareolar, and 9 o'clock regions of the left breast was performed  demonstrating no suspicious cystic or solid sonographic finding. An oval, circumscribed, anechoic mass is incidentally noted at 6 o'clock, 4 cm from the nipple measuring 10 x 8 x 5 mm consistent with a simple cyst. No internal vascularity was identified. Targeted ultrasound of the left axilla demonstrates a prominent left axillary lymph node with cortical thickening measuring up to 7 mm in size.  IMPRESSION: 1. Right breast erythema without corresponding mammographic or sonographic abnormality. 2. Prominent left axillary lymph node with cortical thickening. This may be reactive in the setting of possible intertrigo of and mastitis.  RECOMMENDATION: 1. Recommend treatment of possible right breast mastitis with antibiotics. As the patient has reported pain in the left breast and several episodes of fever and chills, she may also have left mastitis. 2. Following appropriate treatment, repeat left axillary ultrasound is recommended in approximately 6 weeks. If there is persistent enlargement of the left axillary lymph node, an ultrasound-guided biopsy would be recommended. 3. If the patient's clinical symptoms persist, consideration of further evaluation with breast MRI is recommended given the patient's extremely dense breast tissue and family history of breast cancer.  MRI breast 06/21/2017 IMPRESSION: 1. Marked background enhancement limits sensitivity of MRI.  2. No MRI evidence of malignancy in the right breast. No MRI findings to explain the erythema involving the medial right breast.  3. Two indeterminate enhancing masses in the left breast, a 5 mm mass in the central lower left breast and a 9 mm enhancing mass in the central to slightly upper left breast.  RECOMMENDATION: MRI guided biopsy of the 2 indeterminate enhancing masses in the left breast.  BI-RADS CATEGORY 4: Suspicious.  pathology MR bx 06/30/17 Diagnosis 1. Breast,  left, needle core biopsy, upper  central - COMPLEX SCLEROSING LESION WITH ATYPICAL DUCTAL HYPERPLASIA - USUAL DUCTAL HYPERPLASIA AND FLAT EPITHELIAL ATYPIA - CALCIFICATIONS - SEE COMMENT 2. Breast, left, needle core biopsy, lower central - INTRADUCTAL PAPILLOMA - USUAL DUCTAL HYPERPLASIA AND FIBROCYSTIC CHANGES - SEE COMMENT   Allergies (April Staton, CMA; 08/25/2017 8:59 AM) No Known Drug Allergies [05/15/2017]:  Medication History (April Staton, CMA; 08/25/2017 9:02 AM) Meclizine HCl (25MG  Tablet, Oral) Active. Venlafaxine HCl ER (37.5MG  Capsule ER 24HR, Oral) Active. Potassium Chloride (20MEQ Packet, Oral) Active. Triamterene-HCTZ (Oral) Specific strength unknown - Active. Medications Reconciled    Review of Systems Elaine Klein MD; 08/25/2017 9:30 AM) All other systems negative  Vitals (April Staton CMA; 08/25/2017 9:02 AM) 08/25/2017 9:02 AM Weight: 213.38 lb Height: 64in Body Surface Area: 2.01 m Body Mass Index: 36.63 kg/m  Temp.: 97.29F(Oral)  Pulse: 72 (Regular)  P.OX: 98% (Room air) BP: 150/80 (Sitting, Left Arm, Standard)       Physical Exam Elaine Klein MD; 08/25/2017 9:31 AM) General Mental Status-Alert. General Appearance-Consistent with stated age. Hydration-Well hydrated. Voice-Normal.  Chest and Lung Exam Chest and lung exam reveals -quiet, even and easy respiratory effort with no use of accessory muscles. Inspection Chest Wall - Normal. Back - normal.  Breast Note: right breast nearly resolved. Left breast without hematoma. No palpable masses. no LAD.   Cardiovascular Cardiovascular examination reveals -normal pedal pulses bilaterally. Note: regular rate and rhythm  Neurologic Neurologic evaluation reveals -alert and oriented x 3 with no impairment of recent or remote memory. Mental Status-Normal.  Musculoskeletal Global Assessment -Note: no gross deformities.  Normal Exam - Left-Upper Extremity Strength Normal and Lower  Extremity Strength Normal. Normal Exam - Right-Upper Extremity Strength Normal and Lower Extremity Strength Normal.    Assessment & Plan Elaine Klein MD; 08/25/2017 9:32 AM) ABNORMAL MAGNETIC RESONANCE IMAGING OF LEFT BREAST (R92.8) Impression: The patient has 2 discordant and suspicious lesions on the left breast. We will plan to excise these with seed localization. They are close enough that one incision should be adequate. I reviewed the risks of surgery including bleeding and infection, damage to adjacent structures, pain, risk of cancer, risk of needing additional surgeries or procedures. The patient would like to get this done as soon as possible. Current Plans Pt Education - [facility] Breast Biopsy HCI: discussed with patient and provided information. Schedule for Surgery   Signed by Elaine Klein, MD (08/25/2017 9:33 AM)

## 2017-09-12 NOTE — Interval H&P Note (Signed)
History and Physical Interval Note:  09/12/2017 9:15 AM  Elaine Melendez  has presented today for surgery, with the diagnosis of abnormal MRI left breast  The various methods of treatment have been discussed with the patient and family. After consideration of risks, benefits and other options for treatment, the patient has consented to  Procedure(s): 2 RADIOACTIVE SEED GUIDED EXCISIONAL LEFT BREAST BIOPSY ERAS PATHWAY (Left) as a surgical intervention .  The patient's history has been reviewed, patient examined, no change in status, stable for surgery.  I have reviewed the patient's chart and labs.  Questions were answered to the patient's satisfaction.     Stark Klein

## 2017-09-12 NOTE — Anesthesia Postprocedure Evaluation (Signed)
Anesthesia Post Note  Patient: Elaine Melendez  Procedure(s) Performed: 2 RADIOACTIVE SEED GUIDED EXCISIONAL LEFT BREAST BIOPSY ERAS PATHWAY (Left Breast)     Patient location during evaluation: PACU Anesthesia Type: General Level of consciousness: sedated Pain management: pain level controlled Vital Signs Assessment: post-procedure vital signs reviewed and stable Respiratory status: spontaneous breathing and respiratory function stable Cardiovascular status: stable Postop Assessment: no apparent nausea or vomiting Anesthetic complications: no    Last Vitals:  Vitals:   09/12/17 1130 09/12/17 1156  BP: 105/63 119/64  Pulse: 79 81  Resp: 11 18  Temp:  36.4 C  SpO2: 96% 99%    Last Pain:  Vitals:   09/12/17 1156  TempSrc: Oral  PainSc: [place]

## 2017-09-12 NOTE — Anesthesia Procedure Notes (Signed)
Procedure Name: LMA Insertion Date/Time: 09/12/2017 9:27 AM Performed by: Talbot Grumbling, CRNA Pre-anesthesia Checklist: Patient identified, Emergency Drugs available, Suction available and Patient being monitored Patient Re-evaluated:Patient Re-evaluated prior to induction Oxygen Delivery Method: Circle system utilized Preoxygenation: Pre-oxygenation with 100% oxygen Induction Type: IV induction Ventilation: Mask ventilation without difficulty LMA: LMA inserted LMA Size: 4.0 Number of attempts: 1 Placement Confirmation: positive ETCO2 and breath sounds checked- equal and bilateral Tube secured with: Tape Dental Injury: Teeth and Oropharynx as per pre-operative assessment

## 2017-09-12 NOTE — Anesthesia Preprocedure Evaluation (Addendum)
Anesthesia Evaluation  Patient identified by MRN, date of birth, ID band Patient awake    Reviewed: Allergy & Precautions, NPO status , Patient's Chart, lab work & pertinent test results  Airway Mallampati: II  TM Distance: >3 FB Neck ROM: Full    Dental  (+) Dental Advisory Given, Teeth Intact   Pulmonary neg pulmonary ROS,    Pulmonary exam normal breath sounds clear to auscultation       Cardiovascular +CHF  negative cardio ROS Normal cardiovascular exam Rhythm:Regular Rate:Normal     Neuro/Psych  Headaches, Anxiety Meniere's disease with reduced hearing left ear    GI/Hepatic Neg liver ROS, GERD  Controlled,  Endo/Other  Obesity  Renal/GU negative Renal ROS  negative genitourinary   Musculoskeletal negative musculoskeletal ROS (+)   Abdominal   Peds  Hematology negative hematology ROS (+)   Anesthesia Other Findings   Reproductive/Obstetrics                            Anesthesia Physical Anesthesia Plan  ASA: II  Anesthesia Plan: General   Post-op Pain Management:    Induction: Intravenous  PONV Risk Score and Plan: 3 and Treatment may vary due to age or medical condition, Ondansetron, Dexamethasone, Scopolamine patch - Pre-op and Midazolam  Airway Management Planned: LMA  Additional Equipment: None  Intra-op Plan:   Post-operative Plan: Extubation in OR  Informed Consent: I have reviewed the patients History and Physical, chart, labs and discussed the procedure including the risks, benefits and alternatives for the proposed anesthesia with the patient or authorized representative who has indicated his/her understanding and acceptance.   Dental advisory given  Plan Discussed with: CRNA  Anesthesia Plan Comments:        Anesthesia Quick Evaluation

## 2017-09-12 NOTE — Op Note (Signed)
Left Breast Radioactive seed localized excisional biopsy x 2  Indications: This patient presents with history of abnormal left mammogram and core needle biopsies with findings of intraductal papilloma and complex sclerosing lesion with ADH  Pre-operative Diagnosis: abnormal left mammogram  Post-operative Diagnosis: Same  Surgeon: Stark Klein   Anesthesia: General endotracheal anesthesia  ASA Class: 2  Procedure Details  The patient was seen in the Holding Room. The risks, benefits, complications, treatment options, and expected outcomes were discussed with the patient. The possibilities of bleeding, infection, the need for additional procedures, failure to diagnose a condition, and creating a complication requiring transfusion or operation were discussed with the patient. The patient concurred with the proposed plan, giving informed consent.  The site of surgery properly noted/marked. The patient was taken to Operating Room # 6, identified, and the procedure verified as Left Breast seed localized excisional biopsy x 2. A Time Out was held and the above information confirmed.  The left breast and chest were prepped and draped in standard fashion. The lumpectomy was performed by creating a circumareolar incision in the lateral location near the previously placed radioactive seeds.  Dissection was carried down to around the point of maximum signal intensity, addressing the upper outer lesion first.  The cautery was used to perform the dissection.  Hemostasis was achieved with cautery. The edges of the cavity were marked with one clip.   The specimen was inked with the margin marker paint kit.    Specimen radiography confirmed inclusion of the mammographic lesion, the clip, and the seed.  The lower inner seed was then addressed similarly.  The background signal in the breast was zero.  The wound was irrigated and closed with 3-0 vicryl in layers and 4-0 monocryl subcuticular suture.      Sterile  dressings were applied. At the end of the operation, all sponge, instrument, and needle counts were correct.  Findings: grossly clear surgical margins and no adenopathy  Estimated Blood Loss:  min         Specimens: left breast tissue x 2.           Complications:  None; patient tolerated the procedure well.         Disposition: PACU - hemodynamically stable.         Condition: stable

## 2017-09-12 NOTE — Discharge Instructions (Addendum)
[facility] Office Phone Number [phone]  BREAST BIOPSY/ PARTIAL MASTECTOMY: POST OP INSTRUCTIONS  Always review your discharge instruction sheet given to you by the facility where your surgery was performed.  IF YOU HAVE DISABILITY OR FAMILY LEAVE FORMS, YOU MUST BRING THEM TO THE OFFICE FOR PROCESSING.  DO NOT GIVE THEM TO YOUR DOCTOR.  1. A prescription for pain medication may be given to you upon discharge.  Take your pain medication as prescribed, if needed.  If narcotic pain medicine is not needed, then you may take acetaminophen (Tylenol) or ibuprofen (Advil) as needed. 2. Take your usually prescribed medications unless otherwise directed 3. If you need a refill on your pain medication, please contact your pharmacy.  They will contact our office to request authorization.  Prescriptions will not be filled after 5pm or on week-ends. 4. You should eat very light the first 24 hours after surgery, such as soup, crackers, pudding, etc.  Resume your normal diet the day after surgery. 5. Most patients will experience some swelling and bruising in the breast.  Ice packs and a good support bra will help.  Swelling and bruising can take several days to resolve.  6. It is common to experience some constipation if taking pain medication after surgery.  Increasing fluid intake and taking a stool softener will usually help or prevent this problem from occurring.  A mild laxative (Milk of Magnesia or Miralax) should be taken according to package directions if there are no bowel movements after 48 hours. 7. Unless discharge instructions indicate otherwise, you may remove your bandages 48 hours after surgery, and you may shower at that time.  You may have steri-strips (small skin tapes) in place directly over the incision.  These strips should be left on the skin for 7-10 days.   Any sutures or staples will be removed at the office during your follow-up visit. 8. ACTIVITIES:  You may resume  regular daily activities (gradually increasing) beginning the next day.  Wearing a good support bra or sports bra (or the breast binder) minimizes pain and swelling.  You may have sexual intercourse when it is comfortable. a. You may drive when you no longer are taking prescription pain medication, you can comfortably wear a seatbelt, and you can safely maneuver your car and apply brakes. b. RETURN TO WORK:  __________1 week_______________ 9. You should see your doctor in the office for a follow-up appointment approximately two weeks after your surgery.  Your doctors nurse will typically make your follow-up appointment when she calls you with your pathology report.  Expect your pathology report 2-3 business days after your surgery.  You may call to check if you do not hear from [place] after three days.   WHEN TO CALL YOUR DOCTOR: 1. Fever over 101.0 2. Nausea and/or vomiting. 3. Extreme swelling or bruising. 4. Continued bleeding from incision. 5. Increased pain, redness, or drainage from the incision.  The clinic staff is available to answer your questions during regular business hours.  Please dont hesitate to call and ask to speak to one of the nurses for clinical concerns.  If you have a medical emergency, go to the nearest emergency room or call 911.  A surgeon from [facility] is always on call at the hospital.  For further questions, please visit [email or web address]    Last Dose of Tylenol 1000mg  at 7:56am, so wait until after 2PM if another dose is needed.  Post Anesthesia Home Care Instructions  Activity: Get  plenty of rest for the remainder of the day. A responsible individual must stay with you for 24 hours following the procedure.  For the next 24 hours, DO NOT: -Drive a car -Paediatric nurse -Drink alcoholic beverages -Take any medication unless instructed by your physician -Make any legal decisions or sign important papers.  Meals: Start with liquid  foods such as gelatin or soup. Progress to regular foods as tolerated. Avoid greasy, spicy, heavy foods. If nausea and/or vomiting occur, drink only clear liquids until the nausea and/or vomiting subsides. Call your physician if vomiting continues.  Special Instructions/Symptoms: Your throat may feel dry or sore from the anesthesia or the breathing tube placed in your throat during surgery. If this causes discomfort, gargle with warm salt water. The discomfort should disappear within 24 hours.  If you had a scopolamine patch placed behind your ear for the management of post- operative nausea and/or vomiting:  1. The medication in the patch is effective for 72 hours, after which it should be removed.  Wrap patch in a tissue and discard in the trash. Wash hands thoroughly with soap and water. 2. You may remove the patch earlier than 72 hours if you experience unpleasant side effects which may include dry mouth, dizziness or visual disturbances. 3. Avoid touching the patch. Wash your hands with soap and water after contact with the patch.

## 2017-09-13 ENCOUNTER — Encounter (HOSPITAL_BASED_OUTPATIENT_CLINIC_OR_DEPARTMENT_OTHER): Payer: Self-pay | Admitting: General Surgery

## 2017-09-13 NOTE — Addendum Note (Signed)
Addendum  created 09/13/17 0854 by Tawni Millers, CRNA   Charge Capture section accepted

## 2017-09-13 NOTE — Progress Notes (Signed)
Please let patient know that there is no evidence of cancer!

## 2017-11-24 ENCOUNTER — Other Ambulatory Visit: Payer: Self-pay | Admitting: Podiatry

## 2017-11-24 ENCOUNTER — Ambulatory Visit (INDEPENDENT_AMBULATORY_CARE_PROVIDER_SITE_OTHER): Payer: BLUE CROSS/BLUE SHIELD

## 2017-11-24 ENCOUNTER — Encounter: Payer: Self-pay | Admitting: Podiatry

## 2017-11-24 ENCOUNTER — Ambulatory Visit: Payer: BLUE CROSS/BLUE SHIELD | Admitting: Podiatry

## 2017-11-24 DIAGNOSIS — M659 Synovitis and tenosynovitis, unspecified: Secondary | ICD-10-CM

## 2017-11-24 DIAGNOSIS — R52 Pain, unspecified: Secondary | ICD-10-CM

## 2017-11-24 MED ORDER — METHYLPREDNISOLONE 4 MG PO TABS: ORAL_TABLET | ORAL | 0 refills | Status: DC

## 2017-11-24 MED ORDER — MELOXICAM 15 MG PO TABS: 15.0000 mg | ORAL_TABLET | Freq: Every day | ORAL | 3 refills | Status: DC

## 2017-11-24 MED ORDER — DOXYCYCLINE HYCLATE 100 MG PO TABS: 100.0000 mg | ORAL_TABLET | Freq: Two times a day (BID) | ORAL | 0 refills | Status: DC

## 2017-12-04 NOTE — Progress Notes (Signed)
   HPI: 62 year old female presents today as a new patient regarding left ankle pain.  Patient states that for the past week she has had a painful swollen left ankle that extends into the dorsum of her foot.  She states there is been some red discoloration.  Patient states that she does have a history of gout.  She denies injury although she has been doing a lot of walking recently at the [facility] in [facility].  She states that she has body aches due to the excessive walking.  She presents today for further treatment and evaluation  Past Medical History:  Diagnosis Date  . Anxiety   . Dysrhythmia    remote hs tachycardia>15 yrs ago, no meds  . GERD (gastroesophageal reflux disease)   . Headache   . Meniere's disease      Physical Exam: General: The patient is alert and oriented x3 in no acute distress.  Dermatology: Skin is warm, dry and supple bilateral lower extremities. Negative for open lesions or macerations.  Vascular: Palpable pedal pulses bilaterally.  Capillary refill within normal limits.  There is some warmth with edema noted to the left ankle joint and soft tissue structures surrounding the joint.   Neurological: Epicritic and protective threshold grossly intact bilaterally.   Musculoskeletal Exam: Range of motion within normal limits to all pedal and ankle joints bilateral. Muscle strength 5/5 in all groups bilateral.  There is a significant amount of pain on palpation throughout the soft tissue structures of the ankle joint left  Radiographic Exam:  Normal osseous mineralization. Joint spaces preserved. No fracture/dislocation/boney destruction.    Assessment: 1.  Ankle joint synovitis left vs. possible acute gout left ankle vs. possible cellulitis left ankle, although this appears to be very unlikely   Plan of Care:  1. Patient evaluated. X-Rays reviewed.  2.  Today we will give the patient prophylactic oral antibiotics doxycycline 100 mg #20 3.   Prescription for meloxicam #60 4.    Cam boot was dispensed today.  Weightbearing as tolerated times 2 weeks 5.  Return to clinic in 2 weeks     Edrick Kins, DPM [facility]  Dr. Edrick Kins, DPM    [address], [place] [address]                Office [phone]  Fax [phone]

## 2017-12-05 ENCOUNTER — Encounter: Payer: Self-pay | Admitting: Family Medicine

## 2017-12-05 ENCOUNTER — Ambulatory Visit: Payer: BLUE CROSS/BLUE SHIELD | Admitting: Family Medicine

## 2017-12-05 VITALS — BP 130/64 | HR 103 | Temp 97.5°F | Resp 22 | Wt 213.0 lb

## 2017-12-05 DIAGNOSIS — M2540 Effusion, unspecified joint: Secondary | ICD-10-CM | POA: Diagnosis not present

## 2017-12-05 DIAGNOSIS — M2550 Pain in unspecified joint: Secondary | ICD-10-CM | POA: Diagnosis not present

## 2017-12-05 HISTORY — DX: Pain in unspecified joint: M25.50

## 2017-12-05 HISTORY — DX: Effusion, unspecified joint: M25.40

## 2017-12-05 MED ORDER — PREDNISONE 10 MG PO TABS: 10.0000 mg | ORAL_TABLET | Freq: Every day | ORAL | 0 refills | Status: DC

## 2017-12-05 MED ORDER — TRAMADOL HCL 50 MG PO TABS: 50.0000 mg | ORAL_TABLET | Freq: Three times a day (TID) | ORAL | 0 refills | Status: DC | PRN

## 2017-12-05 NOTE — Patient Instructions (Signed)
Joint Pain Joint pain, which is also called arthralgia, can be caused by many things. Joint pain often goes away when you follow your health care provider's instructions for relieving pain at home. However, joint pain can also be caused by conditions that require further treatment. Common causes of joint pain include:  Bruising in the area of the joint.  Overuse of the joint.  Wear and tear on the joints that occur with aging (osteoarthritis).  Various other forms of arthritis.  A buildup of a crystal form of uric acid in the joint (gout).  Infections of the joint (septic arthritis) or of the bone (osteomyelitis).  Your health care provider may recommend medicine to help with the pain. If your joint pain continues, additional tests may be needed to diagnose your condition. Follow these instructions at home: Watch your condition for any changes. Follow these instructions as directed to lessen the pain that you are feeling.  Take medicines only as directed by your health care provider.  Rest the affected area for as long as your health care provider says that you should. If directed to do so, raise the painful joint above the level of your heart while you are sitting or lying down.  Do not do things that cause or worsen pain.  If directed, apply ice to the painful area: ? Put ice in a plastic bag. ? Place a towel between your skin and the bag. ? Leave the ice on for 20 minutes, 2-3 times per day.  Wear an elastic bandage, splint, or sling as directed by your health care provider. Loosen the elastic bandage or splint if your fingers or toes become numb and tingle, or if they turn cold and blue.  Begin exercising or stretching the affected area as directed by your health care provider. Ask your health care provider what types of exercise are safe for you.  Keep all follow-up visits as directed by your health care provider. This is important.  Contact a health care provider if:  Your  pain increases, and medicine does not help.  Your joint pain does not improve within 3 days.  You have increased bruising or swelling.  You have a fever.  You lose 10 lb (4.5 kg) or more without trying. Get help right away if:  You are not able to move the joint.  Your fingers or toes become numb or they turn cold and blue. This information is not intended to replace advice given to you by your health care provider. Make sure you discuss any questions you have with your health care provider. Document Released: 08/01/2005 Document Revised: 01/01/2016 Document Reviewed: 05/13/2014 [employer] Interactive Patient Education  2018 [employer]  

## 2017-12-05 NOTE — Assessment & Plan Note (Signed)
Patient with new onset of multiple joints with effusions, warmth, pain She has no history of autoimmune disease or rheumatologic conditions She has no known injury or tick bite This was helped by her methylprednisolone Dosepak Discussed that she needs a rheumatologic workup We will start this with basic lab work including CMP, CBC, ESR, CRP, rheumatoid factor, ANA Referral to rheumatology for further evaluation and management While waiting for rheumatology appointment we will give prednisone 10 mg daily She will call if this is not helping we may need to try a higher dose of prednisone, but I hope to give her the lowest possible dose Discussed avoiding NSAIDs as this could exacerbate the possible gastritis that can occur with steroids Discussed taking with food and possible H2 blocker for gastric protection Small prescription of tramadol given for severe pain She was treated for possible cellulitis with a 10-day course of doxycycline by the podiatrist recently This did not help her redness or rash, which is likely related to the possible rheumatologic condition There are no signs of infection today Discussed return precautions

## 2017-12-05 NOTE — Assessment & Plan Note (Signed)
See plan for polyarthralgia above

## 2017-12-05 NOTE — Progress Notes (Signed)
Patient: Elaine Melendez Female    DOB: 12/09/1955   62 y.o.   MRN: [id number] Visit Date: 12/05/2017  Today's Provider: Lavon Paganini, MD   Chief Complaint  Patient presents with  . Joint Pain   Subjective:    HPI    Joint/Muscle Pain: Patient complains of arthralgias for which has been present for 2 weeks. Pain is located in both elbow(s), both wrist(s) and both ankle(s), is described as aching, and is constant, severe .  Associated symptoms include: edema and erythema.  The patient has went to podiatry, and was prescribed Doxy, Meloxicam 15 mg and prednisone. These provided no relief. She states Advil 800 mg improves the pain. Related to injury:   No. Pt noticed the swelling and pain after visiting the [place] a couple of weekends ago. She states she walked quite a bit at the [place].   Pt is also experiencing nausea and fatigue. She has not checked temperature, but has experienced chills at night. She states podiatry ordered Xrays, which were negative per pt. She states the podiatrist advised pt to see her PCP for lab work.  She also has AM joint stiffness that lasts ~20 minutes.   Allergies  Allergen Reactions  . Nickel      Current Outpatient Medications:  .  acetaminophen (TYLENOL) 500 MG tablet, Take 1,000 mg by mouth every 6 (six) hours as needed., Disp: , Rfl:  .  doxycycline (VIBRA-TABS) 100 MG tablet, Take 1 tablet (100 mg total) by mouth 2 (two) times daily., Disp: 20 tablet, Rfl: 0 .  meclizine (ANTIVERT) 12.5 MG tablet, Take 12.5 mg by mouth 3 (three) times daily as needed for dizziness., Disp: , Rfl:  .  meloxicam (MOBIC) 15 MG tablet, Take 1 tablet (15 mg total) by mouth daily., Disp: 30 tablet, Rfl: 3 .  methylPREDNISolone (MEDROL) 4 MG tablet, Take as directed, Disp: 21 tablet, Rfl: 0 .  potassium chloride (KLOR-CON) 20 MEQ packet, Take 20 mEq by mouth 2 (two) times daily., Disp: , Rfl:  .  promethazine (PHENERGAN) 25 MG tablet, Take 25 mg by mouth every 6  (six) hours as needed for nausea or vomiting., Disp: , Rfl:  .  triamterene-hydrochlorothiazide (DYAZIDE) 37.5-25 MG per capsule, Take 1 capsule by mouth daily. , Disp: , Rfl:  .  venlafaxine (EFFEXOR) 37.5 MG tablet, Take 37.5 mg by mouth 2 (two) times daily., Disp: , Rfl:   Review of Systems  Constitutional: Positive for activity change, appetite change, chills (at night) and fatigue. Negative for diaphoresis, fever and unexpected weight change.  Respiratory: Negative for shortness of breath and wheezing.   Cardiovascular: Positive for leg swelling. Negative for chest pain.  Gastrointestinal: Positive for nausea. Negative for vomiting.  Musculoskeletal: Positive for arthralgias.    Social History   Tobacco Use  . Smoking status: Never Smoker  . Smokeless tobacco: Never Used  Substance Use Topics  . Alcohol use: No    Alcohol/week: 0.0 oz   Objective:   BP 130/64 (BP Location: Left Arm, Patient Position: Sitting, Cuff Size: Large)   Pulse (!) 103   Temp (!) 97.5 F (36.4 C) (Oral)   Resp (!) 22   Wt 213 lb (96.6 kg)   LMP  (LMP Unknown)   SpO2 96%   BMI 36.56 kg/m  Vitals:   12/05/17 0822  BP: 130/64  Pulse: (!) 103  Resp: (!) 22  Temp: (!) 97.5 F (36.4 C)  TempSrc: Oral  SpO2: 96%  Weight: 213 lb (96.6 kg)     Physical Exam  Constitutional: She is oriented to person, place, and time. She appears well-developed and well-nourished. No distress.  HENT:  Head: Normocephalic and atraumatic.  Eyes: Conjunctivae are normal. No scleral icterus.  Neck: Neck supple.  Cardiovascular: Normal rate, regular rhythm, normal heart sounds and intact distal pulses.  No murmur heard. Pulmonary/Chest: Effort normal and breath sounds normal. No respiratory distress. She has no wheezes. She has no rales.  Musculoskeletal:  L ankle: large effusion.  ROM limited 2/2 pain and effusion.  Erythema over lateral malleolus, base of great toe and along 5th metatarsal. R ankle with effusion  and TTP over lateral malleolus No calf TTP or cords palpable.   B/l wrists and elbows with intact ROM, no TTP, and effusions.  Lymphadenopathy:    She has no cervical adenopathy.  Neurological: She is alert and oriented to person, place, and time.  Skin: Skin is warm and dry. Capillary refill takes less than 2 seconds. There is erythema.  Psychiatric: She has a normal mood and affect. Her behavior is normal.  Vitals reviewed.      Assessment & Plan:   Problem List Items Addressed This Visit      Musculoskeletal and Integument   Joint effusion    See plan for polyarthralgia above      Relevant Orders   CBC w/Diff/Platelet   Rheumatoid Factor   Antinuclear Antib (ANA)   Sed Rate (ESR)   C-reactive protein   Comprehensive metabolic panel   TSH   Ambulatory referral to Rheumatology     Other   Polyarthralgia - Primary    Patient with new onset of multiple joints with effusions, warmth, pain She has no history of autoimmune disease or rheumatologic conditions She has no known injury or tick bite This was helped by her methylprednisolone Dosepak Discussed that she needs a rheumatologic workup We will start this with basic lab work including CMP, CBC, ESR, CRP, rheumatoid factor, ANA Referral to rheumatology for further evaluation and management While waiting for rheumatology appointment we will give prednisone 10 mg daily She will call if this is not helping we may need to try a higher dose of prednisone, but I hope to give her the lowest possible dose Discussed avoiding NSAIDs as this could exacerbate the possible gastritis that can occur with steroids Discussed taking with food and possible H2 blocker for gastric protection Small prescription of tramadol given for severe pain She was treated for possible cellulitis with a 10-day course of doxycycline by the podiatrist recently This did not help her redness or rash, which is likely related to the possible rheumatologic  condition There are no signs of infection today Discussed return precautions      Relevant Orders   CBC w/Diff/Platelet   Rheumatoid Factor   Antinuclear Antib (ANA)   Sed Rate (ESR)   C-reactive protein   Comprehensive metabolic panel   TSH   Ambulatory referral to Rheumatology       Return if symptoms worsen or fail to improve.   The entirety of the information documented in the History of Present Illness, Review of Systems and Physical Exam were personally obtained by me. Portions of this information were initially documented by Raquel Sarna Ratchford, CMA and reviewed by me for thoroughness and accuracy.    Virginia Crews, MD, MPH [facility] 12/05/2017 10:03 AM

## 2017-12-06 ENCOUNTER — Other Ambulatory Visit: Payer: Self-pay | Admitting: Family Medicine

## 2017-12-06 ENCOUNTER — Telehealth: Payer: Self-pay

## 2017-12-06 LAB — CBC WITH DIFFERENTIAL/PLATELET
BASOS: 0 %
Basophils Absolute: 0 10*3/uL (ref 0.0–0.2)
EOS (ABSOLUTE): 0.3 10*3/uL (ref 0.0–0.4)
EOS: 2 %
HEMATOCRIT: 39.6 % (ref 34.0–46.6)
HEMOGLOBIN: 13.4 g/dL (ref 11.1–15.9)
IMMATURE GRANULOCYTES: 0 %
Immature Grans (Abs): 0 10*3/uL (ref 0.0–0.1)
LYMPHS ABS: 2.2 10*3/uL (ref 0.7–3.1)
Lymphs: 15 %
MCH: 30.3 pg (ref 26.6–33.0)
MCHC: 33.8 g/dL (ref 31.5–35.7)
MCV: 90 fL (ref 79–97)
MONOCYTES: 10 %
MONOS ABS: 1.4 10*3/uL — AB (ref 0.1–0.9)
Neutrophils Absolute: 10.2 10*3/uL — ABNORMAL HIGH (ref 1.4–7.0)
Neutrophils: 73 %
Platelets: 253 10*3/uL (ref 150–379)
RBC: 4.42 x10E6/uL (ref 3.77–5.28)
RDW: 12.8 % (ref 12.3–15.4)
WBC: 14 10*3/uL — ABNORMAL HIGH (ref 3.4–10.8)

## 2017-12-06 LAB — COMPREHENSIVE METABOLIC PANEL
ALT: 12 IU/L (ref 0–32)
AST: 13 IU/L (ref 0–40)
Albumin/Globulin Ratio: 1.9 (ref 1.2–2.2)
Albumin: 4.1 g/dL (ref 3.6–4.8)
Alkaline Phosphatase: 109 IU/L (ref 39–117)
BUN/Creatinine Ratio: 12 (ref 12–28)
BUN: 8 mg/dL (ref 8–27)
Bilirubin Total: 0.8 mg/dL (ref 0.0–1.2)
CALCIUM: 9.2 mg/dL (ref 8.7–10.3)
CO2: 23 mmol/L (ref 20–29)
CREATININE: 0.65 mg/dL (ref 0.57–1.00)
Chloride: 96 mmol/L (ref 96–106)
GFR calc Af Amer: 109 mL/min/{1.73_m2} (ref >59)
GFR, EST NON AFRICAN AMERICAN: 95 mL/min/{1.73_m2} (ref >59)
GLUCOSE: 107 mg/dL — AB (ref 65–99)
Globulin, Total: 2.2 g/dL (ref 1.5–4.5)
Potassium: 3.2 mmol/L — ABNORMAL LOW (ref 3.5–5.2)
Sodium: 135 mmol/L (ref 134–144)
Total Protein: 6.3 g/dL (ref 6.0–8.5)

## 2017-12-06 LAB — TSH: TSH: 2.52 u[IU]/mL (ref 0.450–4.500)

## 2017-12-06 LAB — SEDIMENTATION RATE: SED RATE: 52 mm/h — AB (ref 0–40)

## 2017-12-06 LAB — RHEUMATOID FACTOR: Rhuematoid fact SerPl-aCnc: 12.9 IU/mL (ref 0.0–13.9)

## 2017-12-06 LAB — ANA: Anti Nuclear Antibody(ANA): NEGATIVE

## 2017-12-06 LAB — C-REACTIVE PROTEIN: CRP: 134.3 mg/L — ABNORMAL HIGH (ref 0.0–4.9)

## 2017-12-06 NOTE — Telephone Encounter (Signed)
Pt advised and acknowledges understanding.  

## 2017-12-06 NOTE — Telephone Encounter (Signed)
Viewed by [name] on 12/06/2017 1:55 PM

## 2017-12-06 NOTE — Telephone Encounter (Signed)
-----   Message from Virginia Crews, MD sent at 12/06/2017  1:45 PM EDT ----- ANA also negative (for lupus).  Virginia Crews, MD, MPH [facility] 12/06/2017 1:45 PM

## 2017-12-06 NOTE — Telephone Encounter (Signed)
-----   Message from Virginia Crews, MD sent at 12/06/2017  8:40 AM EDT ----- Blood counts are normal, except slightly elevated white blood cell count.  This can be elevated due to inflammation or recent steroid use.  Rheumatoid factor is normal.  Lupus test is still pending.  Inflammation markers are significantly elevated.  Normal kidney function, thyroid function, liver function.  Potassium remains slightly low.  I wonder if patient is still taking her potassium supplement twice daily as previously prescribed.  If not, she should resume this.  If she is, we will need to increase the dose.  Virginia Crews, MD, MPH [facility] 12/06/2017 8:40 AM

## 2017-12-07 ENCOUNTER — Telehealth: Payer: Self-pay | Admitting: Family Medicine

## 2017-12-07 MED ORDER — PREDNISONE 20 MG PO TABS: 20.0000 mg | ORAL_TABLET | Freq: Every day | ORAL | 1 refills | Status: DC

## 2017-12-07 NOTE — Telephone Encounter (Signed)
Sent in new prednisone rx.

## 2017-12-07 NOTE — Telephone Encounter (Signed)
Please review

## 2017-12-07 NOTE — Telephone Encounter (Signed)
We can increase her prednisone to 20 mg daily.  Ok to send new Rx but she can finish what she has by taking 2 of the 10mg  pills together.  Virginia Crews, MD, MPH [facility] 12/07/2017 10:14 AM

## 2017-12-07 NOTE — Telephone Encounter (Signed)
Advised patient as below. I will send in prednisone. What would be the quantity? Please advise. Thanks!

## 2017-12-07 NOTE — Telephone Encounter (Signed)
Pt called saying she seen Dr. Jacinto Reap on Tuesday for a swelling in her joints.  She said she is not any better.  She would like someone to call her back  [phone]  Thanks teri

## 2017-12-07 NOTE — Telephone Encounter (Signed)
Prednisone 20mg  pills take 1 tab daily #30 r1  Bacigalupo, Dionne Bucy, MD, MPH [facility] 12/07/2017 11:12 AM

## 2017-12-08 ENCOUNTER — Ambulatory Visit: Payer: BLUE CROSS/BLUE SHIELD | Admitting: Podiatry

## 2017-12-11 DIAGNOSIS — J984 Other disorders of lung: Secondary | ICD-10-CM | POA: Diagnosis not present

## 2017-12-11 DIAGNOSIS — R7982 Elevated C-reactive protein (CRP): Secondary | ICD-10-CM | POA: Diagnosis not present

## 2017-12-11 DIAGNOSIS — M2550 Pain in unspecified joint: Secondary | ICD-10-CM | POA: Diagnosis not present

## 2017-12-21 DIAGNOSIS — R9389 Abnormal findings on diagnostic imaging of other specified body structures: Secondary | ICD-10-CM | POA: Diagnosis not present

## 2017-12-21 DIAGNOSIS — M2550 Pain in unspecified joint: Secondary | ICD-10-CM | POA: Diagnosis not present

## 2017-12-21 DIAGNOSIS — R7982 Elevated C-reactive protein (CRP): Secondary | ICD-10-CM | POA: Diagnosis not present

## 2017-12-27 DIAGNOSIS — G43719 Chronic migraine without aura, intractable, without status migrainosus: Secondary | ICD-10-CM | POA: Diagnosis not present

## 2017-12-27 DIAGNOSIS — G43019 Migraine without aura, intractable, without status migrainosus: Secondary | ICD-10-CM | POA: Diagnosis not present

## 2018-01-01 ENCOUNTER — Other Ambulatory Visit: Payer: Self-pay | Admitting: Family Medicine

## 2018-01-24 DIAGNOSIS — R053 Chronic cough: Secondary | ICD-10-CM | POA: Insufficient documentation

## 2018-01-24 DIAGNOSIS — R05 Cough: Secondary | ICD-10-CM | POA: Insufficient documentation

## 2018-01-24 DIAGNOSIS — R9389 Abnormal findings on diagnostic imaging of other specified body structures: Secondary | ICD-10-CM | POA: Diagnosis not present

## 2018-01-24 DIAGNOSIS — M2550 Pain in unspecified joint: Secondary | ICD-10-CM | POA: Diagnosis not present

## 2018-01-25 ENCOUNTER — Other Ambulatory Visit: Payer: Self-pay | Admitting: Internal Medicine

## 2018-01-25 DIAGNOSIS — R9389 Abnormal findings on diagnostic imaging of other specified body structures: Secondary | ICD-10-CM

## 2018-01-30 ENCOUNTER — Ambulatory Visit
Admission: RE | Admit: 2018-01-30 | Discharge: 2018-01-30 | Disposition: A | Discharge status: Home / Self Care | Payer: BLUE CROSS/BLUE SHIELD | Source: Ambulatory Visit | Attending: Internal Medicine | Admitting: Internal Medicine

## 2018-01-30 DIAGNOSIS — R590 Localized enlarged lymph nodes: Secondary | ICD-10-CM | POA: Insufficient documentation

## 2018-01-30 DIAGNOSIS — K760 Fatty (change of) liver, not elsewhere classified: Secondary | ICD-10-CM | POA: Diagnosis not present

## 2018-01-30 DIAGNOSIS — R599 Enlarged lymph nodes, unspecified: Secondary | ICD-10-CM | POA: Diagnosis not present

## 2018-01-30 DIAGNOSIS — R9389 Abnormal findings on diagnostic imaging of other specified body structures: Secondary | ICD-10-CM | POA: Diagnosis not present

## 2018-01-30 MED ORDER — IOPAMIDOL (ISOVUE-300) INJECTION 61%
75.0000 mL | Freq: Once | INTRAVENOUS | Status: AC | PRN
  Administered 2018-01-30: 75 mL via INTRAVENOUS

## 2018-02-02 ENCOUNTER — Emergency Department: Payer: BLUE CROSS/BLUE SHIELD

## 2018-02-02 ENCOUNTER — Emergency Department
Admission: EM | Admit: 2018-02-02 | Discharge: 2018-02-02 | Disposition: A | Discharge status: Home / Self Care | Payer: BLUE CROSS/BLUE SHIELD | Attending: Emergency Medicine | Admitting: Emergency Medicine

## 2018-02-02 ENCOUNTER — Encounter: Payer: Self-pay | Admitting: Emergency Medicine

## 2018-02-02 DIAGNOSIS — R29898 Other symptoms and signs involving the musculoskeletal system: Secondary | ICD-10-CM | POA: Diagnosis not present

## 2018-02-02 DIAGNOSIS — M6281 Muscle weakness (generalized): Secondary | ICD-10-CM | POA: Diagnosis not present

## 2018-02-02 DIAGNOSIS — R531 Weakness: Secondary | ICD-10-CM | POA: Diagnosis not present

## 2018-02-02 DIAGNOSIS — R197 Diarrhea, unspecified: Secondary | ICD-10-CM | POA: Diagnosis not present

## 2018-02-02 DIAGNOSIS — Z79899 Other long term (current) drug therapy: Secondary | ICD-10-CM | POA: Insufficient documentation

## 2018-02-02 DIAGNOSIS — E860 Dehydration: Secondary | ICD-10-CM | POA: Diagnosis not present

## 2018-02-02 DIAGNOSIS — R42 Dizziness and giddiness: Secondary | ICD-10-CM | POA: Diagnosis not present

## 2018-02-02 LAB — BASIC METABOLIC PANEL
ANION GAP: 14 (ref 5–15)
BUN: 18 mg/dL (ref 6–20)
CHLORIDE: 97 mmol/L — AB (ref 101–111)
CO2: 23 mmol/L (ref 22–32)
Calcium: 9.7 mg/dL (ref 8.9–10.3)
Creatinine, Ser: 0.92 mg/dL (ref 0.44–1.00)
GFR calc Af Amer: >60 mL/min (ref >60)
Glucose, Bld: 128 mg/dL — ABNORMAL HIGH (ref 65–99)
POTASSIUM: 3.2 mmol/L — AB (ref 3.5–5.1)
SODIUM: 134 mmol/L — AB (ref 135–145)

## 2018-02-02 LAB — HEPATIC FUNCTION PANEL
ALBUMIN: 4.4 g/dL (ref 3.5–5.0)
ALT: 16 U/L (ref 14–54)
AST: 25 U/L (ref 15–41)
Alkaline Phosphatase: 100 U/L (ref 38–126)
BILIRUBIN DIRECT: 0.2 mg/dL (ref 0.1–0.5)
BILIRUBIN TOTAL: 0.8 mg/dL (ref 0.3–1.2)
Indirect Bilirubin: 0.6 mg/dL (ref 0.3–0.9)
Total Protein: 7.8 g/dL (ref 6.5–8.1)

## 2018-02-02 LAB — URINALYSIS, COMPLETE (UACMP) WITH MICROSCOPIC
Bilirubin Urine: NEGATIVE
GLUCOSE, UA: NEGATIVE mg/dL
Hgb urine dipstick: NEGATIVE
Ketones, ur: 20 mg/dL — AB
Nitrite: NEGATIVE
PH: 6 (ref 5.0–8.0)
Protein, ur: NEGATIVE mg/dL
Specific Gravity, Urine: 1.011 (ref 1.005–1.030)

## 2018-02-02 LAB — CBC
HEMATOCRIT: 45.6 % (ref 35.0–47.0)
HEMOGLOBIN: 16 g/dL (ref 12.0–16.0)
MCH: 30.5 pg (ref 26.0–34.0)
MCHC: 35 g/dL (ref 32.0–36.0)
MCV: 87 fL (ref 80.0–100.0)
Platelets: 375 10*3/uL (ref 150–440)
RBC: 5.24 MIL/uL — ABNORMAL HIGH (ref 3.80–5.20)
RDW: 12.6 % (ref 11.5–14.5)
WBC: 13.7 10*3/uL — AB (ref 3.6–11.0)

## 2018-02-02 MED ORDER — SODIUM CHLORIDE 0.9 % IV BOLUS
1000.0000 mL | Freq: Once | INTRAVENOUS | Status: AC
  Administered 2018-02-02: 1000 mL via INTRAVENOUS

## 2018-02-02 MED ORDER — ONDANSETRON HCL 4 MG/2ML IJ SOLN
4.0000 mg | Freq: Once | INTRAMUSCULAR | Status: AC
  Administered 2018-02-02: 4 mg via INTRAVENOUS
  Filled 2018-02-02: qty 2

## 2018-02-02 MED ORDER — POTASSIUM CHLORIDE CRYS ER 20 MEQ PO TBCR
40.0000 meq | EXTENDED_RELEASE_TABLET | Freq: Once | ORAL | Status: AC
  Administered 2018-02-02: 40 meq via ORAL
  Filled 2018-02-02: qty 2

## 2018-02-02 NOTE — ED Provider Notes (Signed)
[facility] Emergency Department Provider Note  ____________________________________________  Time seen: Approximately 12:16 PM  I have reviewed the triage vital signs and the nursing notes.   HISTORY  Chief Complaint Weakness   HPI Diane AKSHITHA CULMER is a 62 y.o. female with a history of Mnire's disease, anxiety, hyperlipidemia, and recently discovered mediastinal lymphadenopathy of unknown etiology who presents for evaluation of bilateral leg weakness.  Patient reports bilateral leg weakness for 2 days.  She reports that is getting progressively worse and he has become severe today.  She denies numbness.  She feels like her legs are going to give out.  She has been having nausea and mild dizziness throughout the week which she attributed to her Mnire's disease.  She took Zofran, Benadryl, and meclizine with no significant improvement.  She also has had 3-4 daily episodes of diarrhea for the last week. She has had decreased appetite as well.  No headache, no upper extremity weakness or numbness, facial droop or slurred speech.  She does endorse mild back pain which is chronic for her.  She denies saddle anesthesia, urinary or bowel incontinence or retention, trauma to her back or unintentional weight loss.  Patient reports that she had a cough for a month however that resolved over the last week.  She underwent a CT by her primary care doctor for that cough and he was found to have mediastinum lymphadenopathy.  She has an appointment next week for further evaluation of this finding.  Patient denies abdominal pain, chest pain, shortness of breath.  No fever, chills, dysuria or hematuria.   Past Medical History:  Diagnosis Date  . Anxiety   . Dysrhythmia    remote hs tachycardia>15 yrs ago, no meds  . GERD (gastroesophageal reflux disease)   . Headache   . Meniere's disease     Patient Active Problem List   Diagnosis Date Noted  . Polyarthralgia 12/05/2017  .  Joint effusion 12/05/2017  . Left breast mass 06/21/2017  . Dense breast tissue 05/25/2017  . Cannot sleep 05/10/2017  . Anxiety 02/28/2007  . Hypercholesteremia 01/31/2007    Past Surgical History:  Procedure Laterality Date  . ANKLE FRACTURE SURGERY Right   . CHOLECYSTECTOMY    . RADIOACTIVE SEED GUIDED EXCISIONAL BREAST BIOPSY Left 09/12/2017   Procedure: 2 RADIOACTIVE SEED GUIDED EXCISIONAL LEFT BREAST BIOPSY ERAS PATHWAY;  Surgeon: Stark Klein, MD;  Location: [place];  Service: General;  Laterality: Left;    Prior to Admission medications   Medication Sig Start Date End Date Taking? Authorizing Provider  naratriptan (AMERGE) 2.5 MG tablet Take 1 tablet by mouth every 4 (four) hours as needed. 12/27/17  Yes [provider]  predniSONE (DELTASONE) 20 MG tablet Take 1 tablet (20 mg total) by mouth daily with breakfast. 01/01/18  Yes Bacigalupo, Dionne Bucy, MD  venlafaxine (EFFEXOR) 37.5 MG tablet Take 37.5 mg by mouth daily.    Yes [provider]  meloxicam (MOBIC) 15 MG tablet Take 1 tablet (15 mg total) by mouth daily. Patient not taking: Reported on 12/05/2017 11/24/17   Edrick Kins, DPM  predniSONE (DELTASONE) 20 MG tablet Take 1 tablet (20 mg total) by mouth daily with breakfast. Patient not taking: Reported on 02/02/2018 12/07/17   Virginia Crews, MD  traMADol (ULTRAM) 50 MG tablet Take 1 tablet (50 mg total) by mouth every 8 (eight) hours as needed. Patient not taking: Reported on 02/02/2018 12/05/17   Virginia Crews, MD  Allergies Nickel  Family History  Problem Relation Age of Onset  . Breast cancer Paternal Aunt   . Breast cancer Maternal Grandmother     Social History Social History   Tobacco Use  . Smoking status: Never Smoker  . Smokeless tobacco: Never Used  Substance Use Topics  . Alcohol use: No    Alcohol/week: 0.0 oz  . Drug use: No    Review of Systems  Constitutional: Negative for fever. +  Dizziness Eyes: Negative for visual changes. ENT: Negative for sore throat. Neck: No neck pain  Cardiovascular: Negative for chest pain. Respiratory: Negative for shortness of breath. Gastrointestinal: Negative for abdominal pain, vomiting. + nausea, and diarrhea. Genitourinary: Negative for dysuria. Musculoskeletal: Negative for back pain. Skin: Negative for rash. Neurological: Negative for headaches. + b/l leg weakness. Psych: No SI or HI  ____________________________________________   PHYSICAL EXAM:  VITAL SIGNS: ED Triage Vitals [02/02/18 0926]  Enc Vitals Group     BP (!) 151/87     Pulse Rate (!) 121     Resp 16     Temp 98.4 F (36.9 C)     Temp Source Oral     SpO2 98 %     Weight 195 lb (88.5 kg)     Height 5\' 4"  (1.626 m)     Head Circumference      Peak Flow      Pain Score 0     Pain Loc      Pain Edu?      Excl. in [place]?     Constitutional: Alert and oriented. Well appearing and in no apparent distress. HEENT:      Head: Normocephalic and atraumatic.         Eyes: Conjunctivae are normal. Sclera is non-icteric.       Mouth/Throat: Mucous membranes are dry.       Neck: Supple with no signs of meningismus. Cardiovascular: Tachycardic with regular rhythm. No murmurs, gallops, or rubs. 2+ symmetrical distal pulses are present in all extremities. No JVD. Respiratory: Normal respiratory effort. Lungs are clear to auscultation bilaterally. No wheezes, crackles, or rhonchi.  Gastrointestinal: Soft, non tender, and non distended with positive bowel sounds. No rebound or guarding. Musculoskeletal: No CT and L-spine tenderness.  Nontender with normal range of motion in all extremities. No edema, cyanosis, or erythema of extremities. Neurologic: Normal speech and language. Face is symmetric.  Intact to strength x4, DTRs are 2+ on bilateral lower extremities, normal sensation. No gross focal neurologic deficits are appreciated. Skin: Skin is warm, dry and intact. No  rash noted. Psychiatric: Mood and affect are normal. Speech and behavior are normal.  ____________________________________________   LABS (all labs ordered are listed, but only abnormal results are displayed)  Labs Reviewed  BASIC METABOLIC PANEL - Abnormal; Notable for the following components:      Result Value   Sodium 134 (*)    Potassium 3.2 (*)    Chloride 97 (*)    Glucose, Bld 128 (*)    All other components within normal limits  CBC - Abnormal; Notable for the following components:   WBC 13.7 (*)    RBC 5.24 (*)    All other components within normal limits  URINALYSIS, COMPLETE (UACMP) WITH MICROSCOPIC - Abnormal; Notable for the following components:   Color, Urine YELLOW (*)    APPearance CLEAR (*)    Ketones, ur 20 (*)    Leukocytes, UA SMALL (*)    Bacteria, UA RARE (*)  All other components within normal limits  HEPATIC FUNCTION PANEL  CBG MONITORING, ED   ____________________________________________  EKG  ED ECG REPORT I, Rudene Re, the attending physician, personally viewed and interpreted this ECG.  Sinus tachycardia, rate of 117, normal intervals, normal axis, no ST elevations or depressions.  Sinus tachycardia is new when compared to prior but no other acute changes per ____________________________________________  RADIOLOGY  I have personally reviewed the images performed during this visit and I agree with the Radiologist's read.   Interpretation by Radiologist:  Dg Chest 2 View  Result Date: 02/02/2018 CLINICAL DATA:  Patient reports extreme weakness in her legs since last Wednesday. Patient reports history of "Meniere's disease" and states, that usually goes away within a day or two, so it's not that. Patient reports nausea and slight dizziness. Non smoker EXAM: CHEST - 2 VIEW COMPARISON:  CT of the chest on 01/30/2018 FINDINGS: Heart size is normal. There is soft tissue density in the RIGHT paratracheal region and AP window region,  consistent with known adenopathy. The lungs are free of focal consolidations and pleural effusions. IMPRESSION: Mediastinal adenopathy. Electronically Signed   By: Nolon Nations M.D.   On: 02/02/2018 12:13   Dg Lumbar Spine Complete  Result Date: 02/02/2018 CLINICAL DATA:  Extreme weakness in legs since Wednesday EXAM: LUMBAR SPINE - COMPLETE 4+ VIEW COMPARISON:  None. FINDINGS: There is no evidence of lumbar spine fracture. Alignment is normal. Intervertebral disc spaces are maintained. Mild lumbar facet spurring at L5-S1. IMPRESSION: 1. No acute or aggressive finding. 2. Mild L5-S1 facet spurring. Electronically Signed   By: Monte Fantasia M.D.   On: 02/02/2018 12:12     ____________________________________________   PROCEDURES  Procedure(s) performed: None Procedures Critical Care performed:  None ____________________________________________   INITIAL IMPRESSION / ASSESSMENT AND PLAN / ED COURSE  62 y.o. female with a history of Mnire's disease, anxiety, hyperlipidemia, and recently discovered mediastinal lymphadenopathy of unknown etiology who presents for evaluation of bilateral leg weakness in the setting of a week of nausea, decreased appetite, dizziness, diarrhea.  Patient looks dry on exam with dry mucous membranes and tachycardia, she is afebrile.  She is completely neurologically intact with intact strength and sensation of all 4 extremities, normal reflexes.  No CT and L-spine tenderness.  No signs or symptoms of cauda equina, lower motor neuron weakness.  There is no signs or symptoms of ischemic limbs, no swelling, no evidence of cellulitis.  EKG showing sinus tachycardia with no ischemic changes.  Labs consistent with hemoconcentration concerning for possible dehydration.  Patient given a liter fluids.  Also showing mild hypokalemia which was supplemented p.o.  Normal calcium. Chest x-ray showing stable mediastinal adenopathy with no infiltrate or pulmonary edema.  Lumbar  spine x-ray was done for chronic back pain and shows no acute findings or lytic lesions. UA pending to rule out UTI. Presentation most likely concerning for possible dehydration in the setting of decreased p.o. intake and diarrhea.  Will reassess after IV fluids.    _________________________ 4:16 PM on 02/02/2018 -----------------------------------------  Patient reports that she feels markedly improved after IV fluids.  UA negative for UTI. Patient remains neuro intact. Plan for f/u with Dr. Genevive Bi next week for evaluation of mediastinal lymphadenopathy.  Discussed return precautions with patient.   As part of my medical decision making, I reviewed the following data within the [place] notes reviewed and incorporated, Labs reviewed , EKG interpreted , Old chart reviewed, Radiograph reviewed ,  Notes from prior ED visits and [place] Controlled Substance Database    Pertinent labs & imaging results that were available during my care of the patient were reviewed by me and considered in my medical decision making (see chart for details).    ____________________________________________   FINAL CLINICAL IMPRESSION(S) / ED DIAGNOSES  Final diagnoses:  Bilateral leg weakness  Dehydration      NEW MEDICATIONS STARTED DURING THIS VISIT:  ED Discharge Orders    None       Note:  This document was prepared using Dragon voice recognition software and may include unintentional dictation errors.    Alfred Levins, [place], MD 02/02/18 [id number]

## 2018-02-02 NOTE — ED Triage Notes (Signed)
Patient reports extreme weakness in her legs since last Wednesday.  Patient reports history of "meniere's disease" and states, that usually goes away within a day or two, so it's not that.  Patient reports nausea and slight dizziness.

## 2018-02-02 NOTE — ED Notes (Signed)
Pt transported to Xray at this time.

## 2018-02-02 NOTE — Discharge Instructions (Addendum)
Drink plenty of fluids.  Follow-up with your doctor on your appointment next week for further evaluation of the enlarged lymph nodes in your chest.  Return to the emergency room if you have back pain, weakness or numbness of your legs, urinary or bowel incontinence or retention, numbness in your groin region.

## 2018-02-05 ENCOUNTER — Encounter: Payer: Self-pay | Admitting: Family Medicine

## 2018-02-06 DIAGNOSIS — Z8669 Personal history of other diseases of the nervous system and sense organs: Secondary | ICD-10-CM | POA: Insufficient documentation

## 2018-02-06 DIAGNOSIS — IMO0001 Reserved for inherently not codable concepts without codable children: Secondary | ICD-10-CM | POA: Insufficient documentation

## 2018-02-06 DIAGNOSIS — N644 Mastodynia: Secondary | ICD-10-CM | POA: Insufficient documentation

## 2018-02-06 DIAGNOSIS — Z9229 Personal history of other drug therapy: Secondary | ICD-10-CM | POA: Insufficient documentation

## 2018-02-06 DIAGNOSIS — Z1239 Encounter for other screening for malignant neoplasm of breast: Secondary | ICD-10-CM | POA: Insufficient documentation

## 2018-02-09 ENCOUNTER — Encounter: Payer: Self-pay | Admitting: Cardiothoracic Surgery

## 2018-02-09 ENCOUNTER — Ambulatory Visit (INDEPENDENT_AMBULATORY_CARE_PROVIDER_SITE_OTHER): Payer: BLUE CROSS/BLUE SHIELD | Admitting: Cardiothoracic Surgery

## 2018-02-09 ENCOUNTER — Telehealth: Payer: Self-pay | Admitting: Internal Medicine

## 2018-02-09 VITALS — BP 131/78 | HR 116 | Temp 97.6°F | Resp 18 | Ht 64.0 in | Wt 199.6 lb

## 2018-02-09 DIAGNOSIS — R590 Localized enlarged lymph nodes: Secondary | ICD-10-CM | POA: Diagnosis not present

## 2018-02-09 NOTE — Patient Instructions (Signed)
We will send the referral to Dr.Kasa's office and someone from their office will contact you to schedule an appointment. If you have not heard from anyone within 5 days please call our office so we can check on this for you.  Please see your follow up appointment listed below.

## 2018-02-09 NOTE — Telephone Encounter (Signed)
Left message for Elaine Melendez to return call. Made aware that Dr.Kasa is not in office until 7/17. Patient can consult with Dr. Juanell Fairly and Dr. Mortimer Fries and still perform EBUS. Ask that she call back.

## 2018-02-09 NOTE — Progress Notes (Signed)
Patient ID: Elaine Melendez, female   DOB: 26-Sep-1955, 62 y.o.   MRN: [id number]  Chief Complaint  Patient presents with  . New Patient (Initial Visit)    Bilateral Hilar and mediastinal adenopathy    Referred By Dr. Brita Romp Reason for Referral mediastinal adenopathy  HPI Location, Quality, Duration, Severity, Timing, Context, Modifying Factors, Associated Signs and Symptoms.  Elaine Melendez is a 62 y.o. female.  Her problems began several months ago when she began experiencing swelling and redness in her feet.  She went to her primary care doctor thought that this might represent some form of acute gouty arthritis or arthritic changes and she was sent to rheumatologist.  Her rheumatologist obtained multiple blood studies which did not reveal any specific cause and she ultimately had a chest x-ray which was abnormal.  This led to a CT scan showing extensive bilateral hilar and mediastinal adenopathy.  There are no obvious lung lesions.  Patient states that she has lost about 15 pounds but denies any fevers or chills.  She has a good appetite.  She has an occasional cough but that has improved as well.  She has no obvious shortness of breath.  She is anxious about the potential diagnosis of lymphoma but otherwise has no specific complaints.     Past Medical History:  Diagnosis Date  . Anxiety   . Cannot sleep 05/10/2017  . Dense breast tissue 05/25/2017  . Dysrhythmia    remote hs tachycardia>15 yrs ago, no meds  . GERD (gastroesophageal reflux disease)   . Headache   . Hypercholesteremia 01/31/2007  . Joint effusion 12/05/2017  . Left breast mass 06/21/2017  . Meniere's disease   . Polyarthralgia 12/05/2017    Past Surgical History:  Procedure Laterality Date  . ANKLE FRACTURE SURGERY Right   . CHOLECYSTECTOMY    . RADIOACTIVE SEED GUIDED EXCISIONAL BREAST BIOPSY Left 09/12/2017   Procedure: 2 RADIOACTIVE SEED GUIDED EXCISIONAL LEFT BREAST BIOPSY ERAS PATHWAY;  Surgeon: Stark Klein,  MD;  Location: [place];  Service: General;  Laterality: Left;    Family History  Problem Relation Age of Onset  . Breast cancer Paternal Aunt   . Breast cancer Maternal Grandmother     Social History Social History   Tobacco Use  . Smoking status: Never Smoker  . Smokeless tobacco: Never Used  Substance Use Topics  . Alcohol use: No    Alcohol/week: 0.0 oz  . Drug use: No    Allergies  Allergen Reactions  . Nickel Rash    Current Outpatient Medications  Medication Sig Dispense Refill  . acetaminophen (TYLENOL) 160 mg/5 mL SOLN Take by mouth.    . meclizine (ANTIVERT) 12.5 MG tablet Take by mouth.    . naratriptan (AMERGE) 2.5 MG tablet Take 1 tablet by mouth every 4 (four) hours as needed.  1  . potassium chloride (K-DUR) 10 MEQ tablet Take by mouth.    . promethazine (PHENERGAN) 25 MG tablet Take by mouth.    . triamterene-hydrochlorothiazide (DYAZIDE) 37.5-25 MG capsule Take by mouth.    . venlafaxine (EFFEXOR) 37.5 MG tablet Take 37.5 mg by mouth daily.      No current facility-administered medications for this visit.       Review of Systems A complete review of systems was asked and was negative except for the following positive findings cough, ankle swelling, skin redness, weight loss  Blood pressure 131/78, pulse (!) 116, temperature 97.6 F (36.4 C), temperature source Oral,  resp. rate 18, height 5\' 4"  (1.626 m), weight 199 lb 9.6 oz (90.5 kg), SpO2 97 %.  Physical Exam CONSTITUTIONAL:  Pleasant, well-developed, well-nourished, and in no acute distress. EYES: Pupils equal and reactive to light, Sclera non-icteric EARS, NOSE, MOUTH AND THROAT:  The oropharynx was clear.  Dentition is good repair.  Oral mucosa pink and moist. LYMPH NODES:  Lymph nodes in the neck and axillae were normal RESPIRATORY:  Lungs were clear.  Normal respiratory effort without pathologic use of accessory muscles of respiration CARDIOVASCULAR: Heart was regular  without murmurs.  There were no carotid bruits. GI: The abdomen was soft, nontender, and nondistended. There were no palpable masses. There was no hepatosplenomegaly. There were normal bowel sounds in all quadrants. GU:  Rectal deferred.   MUSCULOSKELETAL:  Normal muscle strength and tone.  No clubbing or cyanosis.  There is some trace redness along the ankles. SKIN:  There were no pathologic skin lesions.  There were no nodules on palpation. NEUROLOGIC:  Sensation is normal.  Cranial nerves are grossly intact. PSYCH:  Oriented to person, place and time.  Mood and affect are normal.  Data Reviewed CT scan of the chest  I have personally reviewed the patient's imaging, laboratory findings and medical records.    Assessment    I have independently reviewed the patient's CT of the chest.  There is extensive bilateral hilar and mediastinal adenopathy.  This may represent sarcoid or lymphoma.  She does not have any other symptoms of lymphoma.  Interestingly, there are no lung lesions.    Plan    I had a long discussion with her regarding the potential ways to make a diagnosis.  I think that I would recommend endobronchial biopsy as the initial step in making a diagnosis.  I therefore asked my office to set her up to see 1 of our pulmonary medicine specialist to perform that.  I will see her back in the office 1 week after the biopsy is complete.       Nestor Lewandowsky, MD 02/09/2018, 12:24 PM

## 2018-02-09 NOTE — Telephone Encounter (Signed)
Elaine Melendez from [facility] calling States that Dr. Genevive Bi would like for Dr. Mortimer Fries to do an EBUS Elaine Melendez denied 7/25 appointment due to Dr. Genevive Bi wanting sooner Please call Elaine Melendez to discuss a possible sooner appointment at [phone]

## 2018-02-12 NOTE — Telephone Encounter (Signed)
Consult scheduled with DR 02/16/18,

## 2018-02-12 NOTE — Telephone Encounter (Signed)
Would your office contact the patient with appointment for Dr Juanell Fairly that would work best for the patient. I will let Dr Genevive Bi know and we can adjust the appointment with Dr Genevive Bi based on the scheduled EBUS. Thank you for all your help as always.

## 2018-02-16 ENCOUNTER — Institutional Professional Consult (permissible substitution): Payer: BLUE CROSS/BLUE SHIELD | Admitting: Internal Medicine

## 2018-02-19 ENCOUNTER — Telehealth: Payer: Self-pay | Admitting: *Deleted

## 2018-02-19 ENCOUNTER — Encounter: Payer: Self-pay | Admitting: Family Medicine

## 2018-02-19 ENCOUNTER — Encounter: Payer: Self-pay | Admitting: Pulmonary Disease

## 2018-02-19 ENCOUNTER — Ambulatory Visit: Payer: BLUE CROSS/BLUE SHIELD | Admitting: Pulmonary Disease

## 2018-02-19 ENCOUNTER — Ambulatory Visit: Payer: BLUE CROSS/BLUE SHIELD | Admitting: Family Medicine

## 2018-02-19 ENCOUNTER — Other Ambulatory Visit: Payer: Self-pay | Admitting: Family Medicine

## 2018-02-19 VITALS — BP 152/88 | HR 114 | Ht 64.0 in | Wt 200.0 lb

## 2018-02-19 VITALS — BP 124/62 | HR 112 | Temp 97.7°F | Resp 16 | Wt 201.0 lb

## 2018-02-19 DIAGNOSIS — R590 Localized enlarged lymph nodes: Secondary | ICD-10-CM

## 2018-02-19 DIAGNOSIS — L52 Erythema nodosum: Secondary | ICD-10-CM | POA: Diagnosis not present

## 2018-02-19 DIAGNOSIS — F329 Major depressive disorder, single episode, unspecified: Secondary | ICD-10-CM

## 2018-02-19 DIAGNOSIS — M2550 Pain in unspecified joint: Secondary | ICD-10-CM

## 2018-02-19 DIAGNOSIS — F419 Anxiety disorder, unspecified: Secondary | ICD-10-CM

## 2018-02-19 DIAGNOSIS — F32A Depression, unspecified: Secondary | ICD-10-CM

## 2018-02-19 DIAGNOSIS — D869 Sarcoidosis, unspecified: Secondary | ICD-10-CM

## 2018-02-19 MED ORDER — VENLAFAXINE HCL 75 MG PO TABS: 75.0000 mg | ORAL_TABLET | Freq: Every day | ORAL | 2 refills | Status: DC

## 2018-02-19 MED ORDER — ALPRAZOLAM 0.5 MG PO TABS
0.2500 mg | ORAL_TABLET | Freq: Two times a day (BID) | ORAL | 1 refills | Status: DC | PRN
Start: 2018-02-19 — End: 2018-06-19

## 2018-02-19 NOTE — Progress Notes (Addendum)
PULMONARY CONSULT NOTE  Requesting MD/Service: Genevive Bi (primary MD: Brita Romp) Date of initial consultation: 02/19/18 Reason for consultation: Bulky mediastinal lymphadenopathy  PT PROFILE: 62 y.o. female never smoker referred for evaluation of incidental finding of mediastinal LAN  DATA: 12/20/10 CT chest: normal 01/30/18 CT chest: extensive paratracheal, mediastinal and subcarinal lymphadenopathy. Lung fields clear  HPI:  Her current illness dates to early April when she developed severe left ankle pain and swelling.  She also noted bilateral wrist and elbow pain.  At the time, the left ankle was erythematous.  She also believes that she had low-grade fevers at that time. She was treated with prednisone and doxycycline for possible gout versus cellulitis.  Her symptoms improved but relapsed as the prednisone was tapered off.  Therefore, it was resumed and she was referred to rheumatology for further evaluation.  She underwent an extensive serologic evaluation without a specific diagnosis rendered.  At one point, she reported cough and a chest x-ray was performed which revealed paratracheal opacity.  CT scan of the chest was performed on 01/30/18 with findings as documented above.  She was referred to Dr. Genevive Bi with a concern for lymphoma raised.  Dr. Genevive Bi has referred her to [place] to consider bronchoscopy with EBUS biopsy of hilar/mediastinal nodes.  Presently, she is off of prednisone.  She has mild arthralgias.  She has had a relapse of painful erythematous nodular lesions on her left ankle and shin.  She also has mild left ankle swelling.  She is very anxious and concerned about the possible diagnosis of lymphoma.  Past Medical History:  Diagnosis Date  . Anxiety   . Cannot sleep 05/10/2017  . Dense breast tissue 05/25/2017  . Dysrhythmia    remote hs tachycardia>15 yrs ago, no meds  . GERD (gastroesophageal reflux disease)   . Headache   . Hypercholesteremia 01/31/2007  . Joint effusion  12/05/2017  . Left breast mass 06/21/2017  . Meniere's disease   . Polyarthralgia 12/05/2017    Past Surgical History:  Procedure Laterality Date  . ANKLE FRACTURE SURGERY Right   . CHOLECYSTECTOMY    . RADIOACTIVE SEED GUIDED EXCISIONAL BREAST BIOPSY Left 09/12/2017   Procedure: 2 RADIOACTIVE SEED GUIDED EXCISIONAL LEFT BREAST BIOPSY ERAS PATHWAY;  Surgeon: Stark Klein, MD;  Location: [place];  Service: General;  Laterality: Left;    MEDICATIONS: I have reviewed all medications and confirmed regimen as documented  Social History   Socioeconomic History  . Marital status: Married    Spouse name: Not on file  . Number of children: Not on file  . Years of education: Not on file  . Highest education level: Not on file  Occupational History  . Not on file  Social Needs  . Financial resource strain: Not on file  . Food insecurity:    Worry: Not on file    Inability: Not on file  . Transportation needs:    Medical: Not on file    Non-medical: Not on file  Tobacco Use  . Smoking status: Never Smoker  . Smokeless tobacco: Never Used  Substance and Sexual Activity  . Alcohol use: No    Alcohol/week: 0.0 oz  . Drug use: No  . Sexual activity: Not on file  Lifestyle  . Physical activity:    Days per week: Not on file    Minutes per session: Not on file  . Stress: Not on file  Relationships  . Social connections:    Talks on phone: Not on  file    Gets together: Not on file    Attends religious service: Not on file    Active member of club or organization: Not on file    Attends meetings of clubs or organizations: Not on file    Relationship status: Not on file  . Intimate partner violence:    Fear of current or ex partner: Not on file    Emotionally abused: Not on file    Physically abused: Not on file    Forced sexual activity: Not on file  Other Topics Concern  . Not on file  Social History Narrative  . Not on file    Family History  Problem  Relation Age of Onset  . Breast cancer Paternal Aunt   . Breast cancer Maternal Grandmother     ROS: No unexplained weight loss or weight gain No new focal weakness or sensory deficits No otalgia, hearing loss, visual changes, nasal and sinus symptoms, mouth and throat problems No neck pain or adenopathy No abdominal pain, N/V/D, diarrhea, change in bowel pattern No dysuria, change in urinary pattern   Vitals:   02/19/18 1033 02/19/18 1042  BP:  (!) 152/88  Pulse:  (!) 114  SpO2:  99%  Weight: 200 lb (90.7 kg)   Height: 5\' 4"  (1.626 m)      EXAM:  Gen: WDWN, No overt respiratory distress HEENT: NCAT, sclera white, oropharynx normal Neck: Supple without LAN, thyromegaly, JVD Lungs: breath sounds full without wheezes or other adventitious sounds Cardiovascular: RRR, no murmurs noted Abdomen: Soft, nontender, normal BS Ext: No C/C/E. There are 2 circular areas of erythema, slightly raised and mildly tender on lower portion of her L shin and anterior aspect of L ankle Neuro: CNs grossly intact, motor and sensory intact Skin: Limited exam, no lesions noted  DATA:   BMP Latest Ref Rng & Units 02/02/2018 12/05/2017 09/07/2017  Glucose 65 - 99 mg/dL 128(H) 107(H) 116(H)  BUN 6 - 20 mg/dL 18 8 8   Creatinine 0.44 - 1.00 mg/dL 0.92 0.65 0.65  BUN/Creat Ratio 12 - 28 - 12 -  Sodium 135 - 145 mmol/L 134(L) 135 141  Potassium 3.5 - 5.1 mmol/L 3.2(L) 3.2(L) 3.2(L)  Chloride 101 - 111 mmol/L 97(L) 96 103  CO2 22 - 32 mmol/L 23 23 28   Calcium 8.9 - 10.3 mg/dL 9.7 9.2 9.2    CBC Latest Ref Rng & Units 02/02/2018 12/05/2017  WBC 3.6 - 11.0 K/uL 13.7(H) 14.0(H)  Hemoglobin 12.0 - 16.0 g/dL 16.0 13.4  Hematocrit 35.0 - 47.0 % 45.6 39.6  Platelets 150 - 440 K/uL 375 253    CXR 06/21:  L paratracheal adenopathy and slight fullness in AP window  I have personally reviewed all chest radiographs reported above including CXRs and CT chest unless otherwise indicated  IMPRESSION:      ICD-10-CM   1. Erythema nodosum L52   2. Mediastinal lymphadenopathy R59.0   3. Polyarthralgia M25.50   4. Probable Lofgren's syndrome D86.9    The constellation of findings is a classic presentation for Lofgren's syndrome.  However, she is a little bit old to present with this and her symptoms have lingered a little bit longer than usual.  In addition, she is extremely anxious about the possible diagnosis of lymphoma.  Therefore, after much discussion, we agreed to proceed with bronchoscopy/EBUS to establish a definitive tissue diagnosis.  PLAN:  The procedure of bronchoscopy with EBUS was discussed in detail.  We discussed the alternative of continuing  to watch over time.  We discussed the potential risks of the procedure and the likely diagnostic yield (which I indicated was high).  This procedure will be performed next week by Dr. Ashby Dawes.  I have introduced him to the patient.  For now, I recommended ibuprofen as needed for pain, fever, arthralgias.  She will follow-up in 2 weeks to discuss further evaluation and management   Merton Border, MD PCCM service Mobile [phone] Pager [phone] 02/19/2018 1:11 PM

## 2018-02-19 NOTE — H&P (View-Only) (Signed)
PULMONARY CONSULT NOTE  Requesting MD/Service: Genevive Bi (primary MD: Brita Romp) Date of initial consultation: 02/19/18 Reason for consultation: Bulky mediastinal lymphadenopathy  PT PROFILE: 62 y.o. female never smoker referred for evaluation of incidental finding of mediastinal LAN  DATA: 12/20/10 CT chest: normal 01/30/18 CT chest: extensive paratracheal, mediastinal and subcarinal lymphadenopathy. Lung fields clear  HPI:  Elaine Melendez dates to early April when Elaine Melendez developed severe left ankle pain and swelling.  Elaine Melendez also noted bilateral wrist and elbow pain.  At the time, the left ankle was erythematous.  Elaine Melendez also believes that Elaine Melendez had low-grade fevers at that time. Elaine Melendez was treated with prednisone and doxycycline for possible gout versus cellulitis.  Elaine symptoms improved but relapsed as the prednisone was tapered off.  Therefore, it was resumed and Elaine Melendez was referred to rheumatology for further evaluation.  Elaine Melendez underwent an extensive serologic evaluation without a specific diagnosis rendered.  At one point, Elaine Melendez reported cough and a chest x-ray was performed which revealed paratracheal opacity.  CT scan of the chest was performed on 01/30/18 with findings as documented above.  Elaine Melendez was referred to Dr. Genevive Bi with a concern for lymphoma raised.  Dr. Genevive Bi has referred Elaine to [place] to consider bronchoscopy with EBUS biopsy of hilar/mediastinal nodes.  Presently, Elaine Melendez is off of prednisone.  Elaine Melendez has mild arthralgias.  Elaine Melendez has had a relapse of painful erythematous nodular lesions on Elaine left ankle and shin.  Elaine Melendez also has mild left ankle swelling.  Elaine Melendez is very anxious and concerned about the possible diagnosis of lymphoma.  Past Medical History:  Diagnosis Date  . Anxiety   . Cannot sleep 05/10/2017  . Dense breast tissue 05/25/2017  . Dysrhythmia    remote hs tachycardia>15 yrs ago, no meds  . GERD (gastroesophageal reflux disease)   . Headache   . Hypercholesteremia 01/31/2007  . Joint effusion  12/05/2017  . Left breast mass 06/21/2017  . Meniere's disease   . Polyarthralgia 12/05/2017    Past Surgical History:  Procedure Laterality Date  . ANKLE FRACTURE SURGERY Right   . CHOLECYSTECTOMY    . RADIOACTIVE SEED GUIDED EXCISIONAL BREAST BIOPSY Left 09/12/2017   Procedure: 2 RADIOACTIVE SEED GUIDED EXCISIONAL LEFT BREAST BIOPSY ERAS PATHWAY;  Surgeon: Stark Klein, MD;  Location: [facility];  Service: General;  Laterality: Left;    MEDICATIONS: I have reviewed all medications and confirmed regimen as documented  Social History   Socioeconomic History  . Marital status: Married    Spouse name: Not on file  . Number of children: Not on file  . Years of education: Not on file  . Highest education level: Not on file  Occupational History  . Not on file  Social Needs  . Financial resource strain: Not on file  . Food insecurity:    Worry: Not on file    Inability: Not on file  . Transportation needs:    Medical: Not on file    Non-medical: Not on file  Tobacco Use  . Smoking status: Never Smoker  . Smokeless tobacco: Never Used  Substance and Sexual Activity  . Alcohol use: No    Alcohol/week: 0.0 oz  . Drug use: No  . Sexual activity: Not on file  Lifestyle  . Physical activity:    Days per week: Not on file    Minutes per session: Not on file  . Stress: Not on file  Relationships  . Social connections:    Talks on phone: Not on  file    Gets together: Not on file    Attends religious service: Not on file    Active member of club or organization: Not on file    Attends meetings of clubs or organizations: Not on file    Relationship status: Not on file  . Intimate partner violence:    Fear of current or ex partner: Not on file    Emotionally abused: Not on file    Physically abused: Not on file    Forced sexual activity: Not on file  Other Topics Concern  . Not on file  Social History Narrative  . Not on file    Family History  Problem  Relation Age of Onset  . Breast cancer Paternal Aunt   . Breast cancer Maternal Grandmother     ROS: No unexplained weight loss or weight gain No new focal weakness or sensory deficits No otalgia, hearing loss, visual changes, nasal and sinus symptoms, mouth and throat problems No neck pain or adenopathy No abdominal pain, N/V/D, diarrhea, change in bowel pattern No dysuria, change in urinary pattern   Vitals:   02/19/18 1033 02/19/18 1042  BP:  (!) 152/88  Pulse:  (!) 114  SpO2:  99%  Weight: 200 lb (90.7 kg)   Height: 5\' 4"  (1.626 m)      EXAM:  Gen: WDWN, No overt respiratory distress HEENT: NCAT, sclera white, oropharynx normal Neck: Supple without LAN, thyromegaly, JVD Lungs: breath sounds full without wheezes or other adventitious sounds Cardiovascular: RRR, no murmurs noted Abdomen: Soft, nontender, normal BS Ext: No C/C/E. There are 2 circular areas of erythema, slightly raised and mildly tender on lower portion of Elaine L shin and anterior aspect of L ankle Neuro: CNs grossly intact, motor and sensory intact Skin: Limited exam, no lesions noted  DATA:   BMP Latest Ref Rng & Units 02/02/2018 12/05/2017 09/07/2017  Glucose 65 - 99 mg/dL 128(H) 107(H) 116(H)  BUN 6 - 20 mg/dL 18 8 8   Creatinine 0.44 - 1.00 mg/dL 0.92 0.65 0.65  BUN/Creat Ratio 12 - 28 - 12 -  Sodium 135 - 145 mmol/L 134(L) 135 141  Potassium 3.5 - 5.1 mmol/L 3.2(L) 3.2(L) 3.2(L)  Chloride 101 - 111 mmol/L 97(L) 96 103  CO2 22 - 32 mmol/L 23 23 28   Calcium 8.9 - 10.3 mg/dL 9.7 9.2 9.2    CBC Latest Ref Rng & Units 02/02/2018 12/05/2017  WBC 3.6 - 11.0 K/uL 13.7(H) 14.0(H)  Hemoglobin 12.0 - 16.0 g/dL 16.0 13.4  Hematocrit 35.0 - 47.0 % 45.6 39.6  Platelets 150 - 440 K/uL 375 253    CXR 06/21:  L paratracheal adenopathy and slight fullness in AP window  I have personally reviewed all chest radiographs reported above including CXRs and CT chest unless otherwise indicated  IMPRESSION:      ICD-10-CM   1. Erythema nodosum L52   2. Mediastinal lymphadenopathy R59.0   3. Polyarthralgia M25.50   4. Probable Lofgren's syndrome D86.9    The constellation of findings is a classic presentation for Lofgren's syndrome.  However, Elaine Melendez is a little bit old to present with this and Elaine symptoms have lingered a little bit longer than usual.  In addition, Elaine Melendez is extremely anxious about the possible diagnosis of lymphoma.  Therefore, after much discussion, we agreed to proceed with bronchoscopy/EBUS to establish a definitive tissue diagnosis.  PLAN:  The procedure of bronchoscopy with EBUS was discussed in detail.  We discussed the alternative of continuing  to watch over time.  We discussed the potential risks of the procedure and the likely diagnostic yield (which I indicated was high).  This procedure will be performed next week by Dr. Ashby Dawes.  I have introduced him to the patient.  For now, I recommended ibuprofen as needed for pain, fever, arthralgias.  Elaine Melendez will follow-up in 2 weeks to discuss further evaluation and management   Merton Border, MD PCCM service Mobile [phone] Pager [phone] 02/19/2018 1:11 PM

## 2018-02-19 NOTE — Patient Instructions (Signed)
Bronchoscopy with EBUS directed lymph node biopsy planned with Dr. Ashby Dawes next week  Follow-up with me in 2 weeks to discuss results and further management  For now, ibuprofen as needed for pain or fever

## 2018-02-19 NOTE — Progress Notes (Signed)
Patient: Elaine Melendez Female    DOB: 1956-02-25   62 y.o.   MRN: [id number] Visit Date: 02/19/2018  Today's Provider: Lavon Paganini, MD   Chief Complaint  Patient presents with  . ER Follow Up  . Abnormal Chest Imaging   Subjective:    HPI     Follow up ER visit  Patient was seen in ER for leg weakness on 02/02/2018. She was treated for dehydration. Treatment for this included IV fluids. She reports this condition is Improved.  ------------------------------------------------------------------------------------   Follow up for Abnormal Chest Imaging  Pt had a CT chest done on 01/30/2018.  IMPRESSION: 1. Bilateral mediastinal and hilar lymphadenopathy suspicious for lymphoma or metastatic disease. Non neoplastic processes are considered less likely. 2. Hepatic steatosis  Pt was referred to general surgery regarding the lung findings. Surgery referred pt to pulmonology for a lung biopsy. She saw pulmonology this morning, Dr. Alva Garnet. He has scheduled a bronchoscopy for next week. He advised pt that he believes her sx are secondary to Lofgren's Syndrome.  She also has erythema nodosum and polyarthralgia.  She has also been seen by rheumatology.  She was previously on daily prednisone which should help her symptoms at the time.  She is not currently taking prednisone.  She is also avoiding aspirin and NSAIDs prior to her biopsy next week.  She states that she has been very naxious and depressed related to multiple medical issues this year and concerns over breast cancer and lymphoma.  She is taking Effexor daily, but it doesn't seem as effective as it was previously.  She has never taken Benzos. ------------------------------------------------------------------------------------    Allergies  Allergen Reactions  . Nickel Rash     Current Outpatient Medications:  .  acetaminophen (TYLENOL) 160 mg/5 mL SOLN, Take by mouth., Disp: , Rfl:  .  ibuprofen  (ADVIL,MOTRIN) 200 MG tablet, Take 200 mg by mouth every 6 (six) hours as needed., Disp: , Rfl:  .  meclizine (ANTIVERT) 12.5 MG tablet, Take by mouth., Disp: , Rfl:  .  potassium chloride (K-DUR) 10 MEQ tablet, Take by mouth., Disp: , Rfl:  .  promethazine (PHENERGAN) 25 MG tablet, Take by mouth., Disp: , Rfl:  .  triamterene-hydrochlorothiazide (DYAZIDE) 37.5-25 MG capsule, Take by mouth., Disp: , Rfl:  .  venlafaxine (EFFEXOR) 37.5 MG tablet, Take 37.5 mg by mouth daily. , Disp: , Rfl:  .  naratriptan (AMERGE) 2.5 MG tablet, Take 1 tablet by mouth every 4 (four) hours as needed., Disp: , Rfl: 1  Review of Systems  Constitutional: Positive for appetite change, fatigue and unexpected weight change (loss 13-14 pounds). Negative for activity change, chills, diaphoresis and fever.  Respiratory: Positive for cough (improving). Negative for choking, chest tightness, shortness of breath and wheezing.   Cardiovascular: Positive for leg swelling. Negative for chest pain and palpitations.  Skin: Positive for rash.  Neurological: Negative for weakness.  Psychiatric/Behavioral: Positive for dysphoric mood. The patient is nervous/anxious.     Social History   Tobacco Use  . Smoking status: Never Smoker  . Smokeless tobacco: Never Used  Substance Use Topics  . Alcohol use: No    Alcohol/week: 0.0 oz   Objective:   BP 124/62 (BP Location: Left Arm, Patient Position: Sitting, Cuff Size: Large)   Pulse (!) 112   Temp 97.7 F (36.5 C) (Oral)   Resp 16   Wt 201 lb (91.2 kg)   LMP  (LMP Unknown)   SpO2 98%  BMI 34.50 kg/m  Vitals:   02/19/18 1321  BP: 124/62  Pulse: (!) 112  Resp: 16  Temp: 97.7 F (36.5 C)  TempSrc: Oral  SpO2: 98%  Weight: 201 lb (91.2 kg)     Physical Exam  Constitutional: She is oriented to person, place, and time. She appears well-developed and well-nourished. No distress.  HENT:  Head: Normocephalic and atraumatic.  Eyes: Conjunctivae are normal. No scleral  icterus.  Neck: Neck supple.  Cardiovascular: Normal rate, regular rhythm, normal heart sounds and intact distal pulses.  No murmur heard. Pulmonary/Chest: Effort normal and breath sounds normal. No respiratory distress. She has no wheezes. She has no rales.  Musculoskeletal: She exhibits no edema.  +EN of L ankle  Lymphadenopathy:    She has no cervical adenopathy.  Neurological: She is alert and oriented to person, place, and time.  Skin: Skin is warm and dry. Capillary refill takes less than 2 seconds. No rash noted. There is erythema.  Psychiatric: Her speech is normal and behavior is normal. Thought content normal. Her mood appears anxious. Cognition and memory are normal. She exhibits a depressed mood. She expresses no homicidal and no suicidal ideation. She expresses no suicidal plans and no homicidal plans.  Vitals reviewed.   Depression screen [facility] 2/9 02/19/2018 04/04/2017  Decreased Interest 2 0  Down, Depressed, Hopeless 2 0  PHQ - 2 Score 4 0  Altered sleeping 2 -  Tired, decreased energy 3 -  Change in appetite 3 -  Feeling bad or failure about yourself  2 -  Trouble concentrating 0 -  Moving slowly or fidgety/restless 0 -  Suicidal thoughts 0 -  PHQ-9 Score 14 -  Difficult doing work/chores Somewhat difficult -     GAD 7 : Generalized Anxiety Score 02/19/2018  Nervous, Anxious, on Edge 3  Control/stop worrying 3  Worry too much - different things 3  Trouble relaxing 2  Restless 2  Easily annoyed or irritable 0  Afraid - awful might happen 1  Total GAD 7 Score 14  Anxiety Difficulty Somewhat difficult       Assessment & Plan:   Problem List Items Addressed This Visit    Other   Anxiety and depression - Primary    Exacerbated by recent medical issues Tolerating Effexor well, but it is not as effective as it used to be Increase Effexor to 75 mg daily Small supply of Xanax 0.25 to 0.5 mg twice daily as needed given for episodes of acute anxiety As patient is  benzo nave, warned her to take caution before operating machinery, driving her vehicle, or going to work while taking this medication Discussed that benzo therapy will not be long-term and will hopefully be used to help her through this acute exacerbation Discussed that it can take 6 to 8 weeks for Effexor to reach its full efficacy No SI/HI-contracted for safety Discussed return precautions Follow-up in 6 to 8 weeks      Relevant Medications   ALPRAZolam (XANAX) 0.5 MG tablet   venlafaxine (EFFEXOR) 75 MG tablet     2. Polyarthralgia 3. Erythema nodosum 4. Mediastinal lymphadenopathy - thought to be likely Lofgren syndrome (acute sarcoid arthritis) - undergoing EBUS for biopsy of hilar LAD next week for diagnostic confirmation - being treated by Pulm, surgery, and rheumatology    Return in about 8 weeks (around 04/16/2018).   The entirety of the information documented in the History of Present Illness, Review of Systems and Physical Exam were  personally obtained by me. Portions of this information were initially documented by Raquel Sarna Ratchford, CMA and reviewed by me for thoroughness and accuracy.    Virginia Crews, MD, MPH [facility] 02/19/2018 2:18 PM

## 2018-02-19 NOTE — Assessment & Plan Note (Addendum)
Exacerbated by recent medical issues Tolerating Effexor well, but it is not as effective as it used to be Increase Effexor to 75 mg daily Small supply of Xanax 0.25 to 0.5 mg twice daily as needed given for episodes of acute anxiety As patient is benzo nave, warned her to take caution before operating machinery, driving her vehicle, or going to work while taking this medication Discussed that benzo therapy will not be long-term and will hopefully be used to help her through this acute exacerbation Discussed that it can take 6 to 8 weeks for Effexor to reach its full efficacy No SI/HI-contracted for safety Discussed return precautions Follow-up in 6 to 8 weeks

## 2018-02-19 NOTE — Telephone Encounter (Signed)
PROVIDER: Ashby Dawes PROCEDURE: EBUS DATE: 02/27/18 TIME: 1pm DX: mediastinal lymphadenopathy    Pt informed of PAT appt on 02/22/18 @1pm 

## 2018-02-19 NOTE — Patient Instructions (Addendum)

## 2018-02-20 ENCOUNTER — Other Ambulatory Visit: Payer: Self-pay | Admitting: Family Medicine

## 2018-02-20 MED ORDER — VENLAFAXINE HCL 75 MG PO TABS
75.0000 mg | ORAL_TABLET | Freq: Every day | ORAL | 1 refills | Status: DC
Start: 2018-02-20 — End: 2018-08-23

## 2018-02-20 NOTE — Telephone Encounter (Signed)
02/20/18 at 8:29 am EST spoke with Elonda Husky at [facility]. Procedure code [phone] and [phone] does not require PA.  Call Ref # Elonda Husky 02/20/2018. Rhonda J Cobb

## 2018-02-22 ENCOUNTER — Encounter
Admission: RE | Admit: 2018-02-22 | Discharge: 2018-02-22 | Disposition: A | Discharge status: Home / Self Care | Payer: BLUE CROSS/BLUE SHIELD | Source: Ambulatory Visit | Attending: Internal Medicine | Admitting: Internal Medicine

## 2018-02-22 ENCOUNTER — Other Ambulatory Visit: Payer: Self-pay

## 2018-02-22 DIAGNOSIS — Z01812 Encounter for preprocedural laboratory examination: Secondary | ICD-10-CM | POA: Insufficient documentation

## 2018-02-22 LAB — POTASSIUM: POTASSIUM: 3.5 mmol/L (ref 3.5–5.1)

## 2018-02-22 NOTE — Patient Instructions (Signed)
Your procedure is scheduled on: Tues 7/16 Report to Day Surgery. To find out your arrival time please call [phone] between 1PM - 3PM on Mon 7/15.  Remember: Instructions that are not followed completely may result in serious medical risk,  up to and including death, or upon the discretion of your surgeon and anesthesiologist your  surgery may need to be rescheduled.     _X__ 1. Do not eat food after midnight the night before your procedure.                 No gum chewing or hard candies. You may drink clear liquids up to 2 hours                 before you are scheduled to arrive for your surgery- DO not drink clear                 liquids within 2 hours of the start of your surgery.                 Clear Liquids include:  water, apple juice without pulp, clear carbohydrate                 drink such as Clearfast of Gatorade, Black Coffee or Tea (Do not add                 anything to coffee or tea).  __X__2.  On the morning of surgery brush your teeth with toothpaste and water, you                may rinse your mouth with mouthwash if you wish.  Do not swallow any toothpaste of mouthwash.     ___ 3.  No Alcohol for 24 hours before or after surgery.   ___ 4.  Do Not Smoke or use e-cigarettes For 24 Hours Prior to Your Surgery.                 Do not use any chewable tobacco products for at least 6 hours prior to                 surgery.  ____  5.  Bring all medications with you on the day of surgery if instructed.   __x__  6.  Notify your doctor if there is any change in your medical condition      (cold, fever, infections).     Do not wear jewelry, make-up, hairpins, clips or nail polish. Do not wear lotions, powders, or perfumes. You may wear deodorant. Do not shave 48 hours prior to surgery. Men may shave face and neck. Do not bring valuables to the hospital.    [facility] is not responsible for any belongings or valuables.  Contacts, dentures or  bridgework may not be worn into surgery. Leave your suitcase in the car. After surgery it may be brought to your room. For patients admitted to the hospital, discharge time is determined by your treatment team.   Patients discharged the day of surgery will not be allowed to drive home.   Please read over the following fact sheets that you were given:     _x__ Take these medicines the morning of surgery with A SIP OF WATER:    1. ALPRAZolam (XANAX) 0.5 MG tablet  2. potassium chloride (K-DUR) 10 MEQ tablet  3. venlafaxine (EFFEXOR) 75 MG tablet  4.  5.  6.  ____ Fleet Enema (as directed)  ____ Use CHG Soap as directed  ____ Use inhalers on the day of surgery  ____ Stop metformin 2 days prior to surgery    ____ Take 1/2 of usual insulin dose the night before surgery. No insulin the morning          of surgery.   ____ Stop Coumadin/Plavix/aspirin on   __x__ Stop Anti-inflammatories ibuprofen (ADVIL,MOTRIN) 200 MG tablet today    Tylenol OK   ____ Stop supplements until after surgery.    ____ Bring C-Pap to the hospital.

## 2018-02-23 ENCOUNTER — Other Ambulatory Visit: Payer: Self-pay | Admitting: Internal Medicine

## 2018-02-23 DIAGNOSIS — R590 Localized enlarged lymph nodes: Secondary | ICD-10-CM

## 2018-02-26 ENCOUNTER — Ambulatory Visit: Payer: Self-pay | Admitting: Cardiothoracic Surgery

## 2018-02-27 ENCOUNTER — Ambulatory Visit
Admission: RE | Admit: 2018-02-27 | Discharge: 2018-02-27 | Disposition: A | Discharge status: Home / Self Care | Payer: BLUE CROSS/BLUE SHIELD | Source: Ambulatory Visit | Attending: Internal Medicine | Admitting: Internal Medicine

## 2018-02-27 ENCOUNTER — Ambulatory Visit: Payer: BLUE CROSS/BLUE SHIELD | Admitting: Anesthesiology

## 2018-02-27 ENCOUNTER — Other Ambulatory Visit: Payer: Self-pay

## 2018-02-27 ENCOUNTER — Encounter
Admission: RE | Disposition: A | Discharge status: Home / Self Care | Payer: Self-pay | Source: Ambulatory Visit | Attending: Internal Medicine

## 2018-02-27 DIAGNOSIS — M2550 Pain in unspecified joint: Secondary | ICD-10-CM | POA: Insufficient documentation

## 2018-02-27 DIAGNOSIS — F419 Anxiety disorder, unspecified: Secondary | ICD-10-CM | POA: Insufficient documentation

## 2018-02-27 DIAGNOSIS — R590 Localized enlarged lymph nodes: Secondary | ICD-10-CM | POA: Diagnosis not present

## 2018-02-27 DIAGNOSIS — F418 Other specified anxiety disorders: Secondary | ICD-10-CM | POA: Diagnosis not present

## 2018-02-27 DIAGNOSIS — E7800 Pure hypercholesterolemia, unspecified: Secondary | ICD-10-CM | POA: Diagnosis not present

## 2018-02-27 DIAGNOSIS — L52 Erythema nodosum: Secondary | ICD-10-CM | POA: Diagnosis not present

## 2018-02-27 DIAGNOSIS — D861 Sarcoidosis of lymph nodes: Secondary | ICD-10-CM | POA: Diagnosis not present

## 2018-02-27 DIAGNOSIS — Z79899 Other long term (current) drug therapy: Secondary | ICD-10-CM | POA: Diagnosis not present

## 2018-02-27 DIAGNOSIS — T884XXA Failed or difficult intubation, initial encounter: Secondary | ICD-10-CM | POA: Insufficient documentation

## 2018-02-27 DIAGNOSIS — K219 Gastro-esophageal reflux disease without esophagitis: Secondary | ICD-10-CM | POA: Diagnosis not present

## 2018-02-27 DIAGNOSIS — Y848 Other medical procedures as the cause of abnormal reaction of the patient, or of later complication, without mention of misadventure at the time of the procedure: Secondary | ICD-10-CM | POA: Diagnosis not present

## 2018-02-27 DIAGNOSIS — J383 Other diseases of vocal cords: Secondary | ICD-10-CM | POA: Diagnosis not present

## 2018-02-27 DIAGNOSIS — Z791 Long term (current) use of non-steroidal anti-inflammatories (NSAID): Secondary | ICD-10-CM | POA: Insufficient documentation

## 2018-02-27 HISTORY — PX: ENDOBRONCHIAL ULTRASOUND: SHX5096

## 2018-02-27 SURGERY — ENDOBRONCHIAL ULTRASOUND (EBUS)
Anesthesia: General

## 2018-02-27 MED ORDER — ROCURONIUM BROMIDE 100 MG/10ML IV SOLN
INTRAVENOUS | Status: DC | PRN
  Administered 2018-02-27: 5 mg via INTRAVENOUS
  Administered 2018-02-27: 20 mg via INTRAVENOUS

## 2018-02-27 MED ORDER — FENTANYL CITRATE (PF) 100 MCG/2ML IJ SOLN
INTRAMUSCULAR | Status: AC
  Filled 2018-02-27: qty 2

## 2018-02-27 MED ORDER — ROCURONIUM BROMIDE 50 MG/5ML IV SOLN
INTRAVENOUS | Status: AC
  Filled 2018-02-27: qty 1

## 2018-02-27 MED ORDER — FENTANYL CITRATE (PF) 100 MCG/2ML IJ SOLN
25.0000 ug | INTRAMUSCULAR | Status: DC | PRN
  Administered 2018-02-27 (×4): 25 ug via INTRAVENOUS

## 2018-02-27 MED ORDER — PHENYLEPHRINE HCL 10 MG/ML IJ SOLN
INTRAMUSCULAR | Status: DC | PRN
  Administered 2018-02-27: 100 ug via INTRAVENOUS

## 2018-02-27 MED ORDER — PROPOFOL 10 MG/ML IV BOLUS
INTRAVENOUS | Status: DC | PRN
  Administered 2018-02-27: 50 mg via INTRAVENOUS
  Administered 2018-02-27: 150 mg via INTRAVENOUS

## 2018-02-27 MED ORDER — PROPOFOL 10 MG/ML IV BOLUS
INTRAVENOUS | Status: AC
  Filled 2018-02-27: qty 20

## 2018-02-27 MED ORDER — ONDANSETRON HCL 4 MG/2ML IJ SOLN
INTRAMUSCULAR | Status: AC
  Filled 2018-02-27: qty 2

## 2018-02-27 MED ORDER — PHENYLEPHRINE HCL 0.25 % NA SOLN
1.0000 | Freq: Four times a day (QID) | NASAL | Status: DC | PRN
  Filled 2018-02-27: qty 15

## 2018-02-27 MED ORDER — LIDOCAINE HCL 2 % EX GEL
1.0000 "application " | Freq: Once | CUTANEOUS | Status: DC
  Filled 2018-02-27: qty 5

## 2018-02-27 MED ORDER — MIDAZOLAM HCL 5 MG/5ML IJ SOLN
INTRAMUSCULAR | Status: DC | PRN
  Administered 2018-02-27: 2 mg via INTRAVENOUS

## 2018-02-27 MED ORDER — MIDAZOLAM HCL 2 MG/2ML IJ SOLN
INTRAMUSCULAR | Status: AC
  Filled 2018-02-27: qty 2

## 2018-02-27 MED ORDER — FAMOTIDINE 20 MG PO TABS
20.0000 mg | ORAL_TABLET | Freq: Once | ORAL | Status: AC
  Administered 2018-02-27: 20 mg via ORAL

## 2018-02-27 MED ORDER — BUTAMBEN-TETRACAINE-BENZOCAINE 2-2-14 % EX AERO
1.0000 | INHALATION_SPRAY | Freq: Once | CUTANEOUS | Status: DC
  Filled 2018-02-27: qty 20

## 2018-02-27 MED ORDER — LIDOCAINE HCL (CARDIAC) PF 100 MG/5ML IV SOSY
PREFILLED_SYRINGE | INTRAVENOUS | Status: DC | PRN
  Administered 2018-02-27: 60 mg via INTRAVENOUS

## 2018-02-27 MED ORDER — FENTANYL CITRATE (PF) 100 MCG/2ML IJ SOLN
INTRAMUSCULAR | Status: DC | PRN
  Administered 2018-02-27: 50 ug via INTRAVENOUS

## 2018-02-27 MED ORDER — FAMOTIDINE 20 MG PO TABS
ORAL_TABLET | ORAL | Status: AC
  Filled 2018-02-27: qty 1

## 2018-02-27 MED ORDER — DEXAMETHASONE SODIUM PHOSPHATE 10 MG/ML IJ SOLN
INTRAMUSCULAR | Status: AC
  Filled 2018-02-27: qty 1

## 2018-02-27 MED ORDER — SUCCINYLCHOLINE CHLORIDE 20 MG/ML IJ SOLN
INTRAMUSCULAR | Status: DC | PRN
  Administered 2018-02-27: 80 mg via INTRAVENOUS
  Administered 2018-02-27: 100 mg via INTRAVENOUS

## 2018-02-27 MED ORDER — DEXAMETHASONE SODIUM PHOSPHATE 10 MG/ML IJ SOLN
INTRAMUSCULAR | Status: DC | PRN
  Administered 2018-02-27: 10 mg via INTRAVENOUS

## 2018-02-27 MED ORDER — FENTANYL CITRATE (PF) 100 MCG/2ML IJ SOLN
INTRAMUSCULAR | Status: AC
  Administered 2018-02-27: 25 ug via INTRAVENOUS
  Filled 2018-02-27: qty 2

## 2018-02-27 MED ORDER — SUCCINYLCHOLINE CHLORIDE 20 MG/ML IJ SOLN
INTRAMUSCULAR | Status: AC
  Filled 2018-02-27: qty 1

## 2018-02-27 MED ORDER — ONDANSETRON HCL 4 MG/2ML IJ SOLN
INTRAMUSCULAR | Status: DC | PRN
  Administered 2018-02-27: 4 mg via INTRAVENOUS

## 2018-02-27 MED ORDER — SUGAMMADEX SODIUM 200 MG/2ML IV SOLN
INTRAVENOUS | Status: DC | PRN
  Administered 2018-02-27: 180 mg via INTRAVENOUS

## 2018-02-27 MED ORDER — LACTATED RINGERS IV SOLN
INTRAVENOUS | Status: DC
  Administered 2018-02-27: 12:00:00 via INTRAVENOUS

## 2018-02-27 MED ORDER — ONDANSETRON HCL 4 MG/2ML IJ SOLN: 4.0000 mg | Freq: Once | INTRAMUSCULAR | Status: DC | PRN

## 2018-02-27 NOTE — OR Nursing (Signed)
Discharge instructions discussed with pt and husband. Both voice understanding. 

## 2018-02-27 NOTE — Anesthesia Post-op Follow-up Note (Signed)
Anesthesia QCDR form completed.        

## 2018-02-27 NOTE — Anesthesia Preprocedure Evaluation (Signed)
Anesthesia Evaluation  Patient identified by MRN, date of birth, ID band Patient awake    Reviewed: Allergy & Precautions, H&P , NPO status , Patient's Chart, lab work & pertinent test results, reviewed documented beta blocker date and time   Airway Mallampati: II  TM Distance: >3 FB Neck ROM: full    Dental  (+) Teeth Intact   Pulmonary neg pulmonary ROS,    Pulmonary exam normal        Cardiovascular Exercise Tolerance: Poor negative cardio ROS Normal cardiovascular exam+ dysrhythmias  Rhythm:regular Rate:Normal     Neuro/Psych  Headaches, Anxiety negative neurological ROS  negative psych ROS   GI/Hepatic negative GI ROS, Neg liver ROS, GERD  Medicated,  Endo/Other  negative endocrine ROS  Renal/GU negative Renal ROS  negative genitourinary   Musculoskeletal   Abdominal   Peds  Hematology negative hematology ROS (+)   Anesthesia Other Findings Past Medical History: No date: Anxiety 05/10/2017: Cannot sleep 05/25/2017: Dense breast tissue No date: Dysrhythmia     Comment:  remote hs tachycardia>15 yrs ago, no meds No date: GERD (gastroesophageal reflux disease) No date: Headache 01/31/2007: Hypercholesteremia 12/05/2017: Joint effusion 06/21/2017: Left breast mass No date: Meniere's disease 12/05/2017: Polyarthralgia Past Surgical History: No date: ANKLE FRACTURE SURGERY; Right No date: BREAST LUMPECTOMY No date: CHOLECYSTECTOMY 09/12/2017: RADIOACTIVE SEED GUIDED EXCISIONAL BREAST BIOPSY; Left     Comment:  Procedure: 2 RADIOACTIVE SEED GUIDED EXCISIONAL LEFT               BREAST BIOPSY ERAS PATHWAY;  Surgeon: Stark Klein, MD;               Location: [facility];  Service: General;                Laterality: Left;   Reproductive/Obstetrics negative OB ROS                             Anesthesia Physical Anesthesia Plan  ASA: III and emergent  Anesthesia Plan:  General ETT   Post-op Pain Management:    Induction:   PONV Risk Score and Plan:   Airway Management Planned:   Additional Equipment:   Intra-op Plan:   Post-operative Plan:   Informed Consent: I have reviewed the patients History and Physical, chart, labs and discussed the procedure including the risks, benefits and alternatives for the proposed anesthesia with the patient or authorized representative who has indicated his/her understanding and acceptance.   Dental Advisory Given  Plan Discussed with: CRNA  Anesthesia Plan Comments:         Anesthesia Quick Evaluation

## 2018-02-27 NOTE — Transfer of Care (Signed)
Immediate Anesthesia Transfer of Care Note  Patient: Elaine Melendez  Procedure(s) Performed: ENDOBRONCHIAL ULTRASOUND (N/A )  Patient Location: PACU  Anesthesia Type:General  Level of Consciousness: awake and patient cooperative  Airway & Oxygen Therapy: Patient Spontanous Breathing and Patient connected to face mask oxygen  Post-op Assessment: Report given to RN and Post -op Vital signs reviewed and stable  Post vital signs: Reviewed and stable  Last Vitals:  Vitals Value Taken Time  BP 133/60   Temp    Pulse 92 02/27/2018  2:26 PM  Resp 15 02/27/2018  2:26 PM  SpO2 100 % 02/27/2018  2:26 PM  Vitals shown include unvalidated device data.  Last Pain:  Vitals:   02/27/18 1211  TempSrc: Tympanic  PainSc: 0-No pain         Complications: No apparent anesthesia complications

## 2018-02-27 NOTE — Interval H&P Note (Signed)
History and Physical Interval Note:  02/27/2018 12:17 PM  Elaine Melendez  has presented today for surgery, with the diagnosis of mediastinal lymphadenopathy  The various methods of treatment have been discussed with the patient and family. After consideration of risks, benefits and other options for treatment, the patient has consented to  Procedure(s): ENDOBRONCHIAL ULTRASOUND (N/A) as a surgical intervention .  The patient's history has been reviewed, patient examined, no change in status, stable for surgery.  I have reviewed the patient's chart and labs.  Questions were answered to the patient's satisfaction.     Laverle Hobby

## 2018-02-27 NOTE — Op Note (Signed)
  [facility]            Bronchoscopy Note   FINDINGS/SUMMARY:   -Enlarged lymph nodes seen in the subcarinal, right paratracheal areas, multiple needle passes taken sent for cytology as well as flow cytometry. - Washings, cytology brushing taken from right middle lobe. - Difficult intubation due to anterior cords per anesthesiology. - Otherwise normal airways without endobronchial lesions.  Indication: Mediastinal lymphadenopathy The patient (or their representative) was informed of the risks (including but not limited to bleeding, infection, respiratory failure, lung injury, tooth/oral injury) and benefits of the procedure and gave consent, see chart.   Pre-op diagnosis: Mediastinal lymphadenopathy Post-op diagnosis: Same Estimated blood loss: 5 mL  Medications for procedure: Please see anesthesia notes.  Procedure description: Patient was brought to the bronchoscopy suite, sedation was provided by anesthesia services please see their notes for further details.  Of note the patient was deemed an difficult intubation due to anterior cords. The EBUS scope was passed via the endotracheal tube to the main carina.  There was a large subcarinal lymph node station seen, with septations/matting.  Multiple passes were taken, sent for cytology, as well as flow cytometry.  Bronchoscope was then taken to the right hilar node which was also enlarged with septations.  Multiple passes were taken and sent for flow cytometry as well as cytology. The bronchoscope was then taken to the right hilar area, there was some enlarged lymphadenopathy seen here as well, but not to the degree of the other lymph node stations, therefore no further biopsies were taken from here. The EBUS scope was then removed, the regular white light bronchoscope was passed by the endotracheal tube, an anatomical tumor was undertaken.  There was moderate mucosal secretions which were suctioned, there is moderate  mucosal erythema and edema throughout both lungs, no gross anatomical abnormalities were noted.  The bronchoscope was taken to the right middle lobe for a random sampling, bronchoalveolar lavage was performed sent for cytology and microbiology, cytology brush was taken from the right middle lobe.  As adequate samples have been taken at that time, the bronchoscope was removed and the procedure was terminated.   Condition post procedure: Stable   Complications: None noted     Deep Ashby Dawes, M.D., F.C.C.P. Board Certified in Internal Medicine, Pulmonary Medicine, [place], and Sleep Medicine.  [facility] Office Number: [phone]  02/27/2018

## 2018-02-27 NOTE — Discharge Instructions (Signed)
Flexible Bronchoscopy, Care After These instructions give you information on caring for yourself after your procedure. Your doctor may also give you more specific instructions. Call your doctor if you have any problems or questions after your procedure. Follow these instructions at home:  Do not eat or drink anything for 2 hours after your procedure. If you try to eat or drink before the medicine wears off, food or drink could go into your lungs. You could also burn yourself.  After 2 hours have passed and when you can cough and gag normally, you may eat soft food and drink liquids slowly.  The day after the test, you may eat your normal diet.  You may do your normal activities.  Keep all doctor visits. Get help right away if:  You get more and more short of breath.  You get light-headed.  You feel like you are going to pass out (faint).  You have chest pain.  You have new problems that worry you.  You cough up more than a little blood.  You cough up more blood than before. This information is not intended to replace advice given to you by your health care provider. Make sure you discuss any questions you have with your health care provider. Document Released: 05/29/2009 Document Revised: 01/07/2016 Document Reviewed: 04/05/2013 Elsevier Interactive Patient Education  2017 Agenda   1) The drugs that you were given will stay in your system until tomorrow so for the next 24 hours you should not:  A) Drive an automobile B) Make any legal decisions C) Drink any alcoholic beverage   2) You may resume regular meals tomorrow.  Today it is better to start with liquids and gradually work up to solid foods.  You may eat anything you prefer, but it is better to start with liquids, then soup and crackers, and gradually work up to solid foods.   3) Please notify your doctor immediately if you have any unusual bleeding, trouble  breathing, redness and pain at the surgery site, drainage, fever, or pain not relieved by medication.    4) Additional Instructions:        Please contact your physician with any problems or Same Day Surgery at [phone], Monday through Friday 6 am to 4 pm, or [place] at [facility] number at [phone].

## 2018-02-27 NOTE — Anesthesia Procedure Notes (Signed)
Procedure Name: Intubation Date/Time: 02/27/2018 1:23 PM Performed by: Dionne Bucy, CRNA Pre-anesthesia Checklist: Patient identified, Patient being monitored, Timeout performed, Emergency Drugs available and Suction available Patient Re-evaluated:Patient Re-evaluated prior to induction Oxygen Delivery Method: Circle system utilized Preoxygenation: Pre-oxygenation with 100% oxygen Induction Type: IV induction Ventilation: Mask ventilation without difficulty Laryngoscope Size: McGraph and 3 Grade View: Grade II Tube type: Oral Tube size: 8.0 mm Number of attempts: 4 Airway Equipment and Method: Stylet and Video-laryngoscopy Placement Confirmation: ETT inserted through vocal cords under direct vision,  positive ETCO2 and breath sounds checked- equal and bilateral Secured at: 21 cm Tube secured with: Tape Dental Injury: Teeth and Oropharynx as per pre-operative assessment  Difficulty Due To: Difficulty was anticipated, Difficult Airway- due to anterior larynx and Difficult Airway- due to large tongue Future Recommendations: Recommend- induction with short-acting agent, and alternative techniques readily available

## 2018-02-28 ENCOUNTER — Encounter: Payer: Self-pay | Admitting: Internal Medicine

## 2018-02-28 NOTE — Anesthesia Postprocedure Evaluation (Signed)
Anesthesia Post Note  Patient: Elaine Melendez  Procedure(s) Performed: ENDOBRONCHIAL ULTRASOUND (N/A )  Patient location during evaluation: PACU Anesthesia Type: General Level of consciousness: awake and alert Pain management: pain level controlled Vital Signs Assessment: post-procedure vital signs reviewed and stable Respiratory status: spontaneous breathing, nonlabored ventilation, respiratory function stable and patient connected to nasal cannula oxygen Cardiovascular status: blood pressure returned to baseline and stable Postop Assessment: no apparent nausea or vomiting Anesthetic complications: no     Last Vitals:  Vitals:   02/27/18 1553 02/27/18 1612  BP: (!) 110/56 116/62  Pulse: 86 91  Resp: 16 17  Temp: 36.7 C   SpO2: 95% 97%    Last Pain:  Vitals:   02/28/18 0909  TempSrc:   PainSc: 0-No pain                 Molli Barrows

## 2018-03-01 LAB — ACID FAST SMEAR (AFB, MYCOBACTERIA): Acid Fast Smear: NEGATIVE

## 2018-03-01 LAB — ACID FAST SMEAR (AFB)

## 2018-03-02 LAB — CYTOLOGY - NON PAP

## 2018-03-02 LAB — CULTURE, BAL-QUANTITATIVE W GRAM STAIN: Special Requests: NORMAL

## 2018-03-02 LAB — CULTURE, BAL-QUANTITATIVE: CULTURE: NO GROWTH

## 2018-03-13 ENCOUNTER — Ambulatory Visit: Payer: BLUE CROSS/BLUE SHIELD | Admitting: Pulmonary Disease

## 2018-03-13 ENCOUNTER — Encounter: Payer: Self-pay | Admitting: Pulmonary Disease

## 2018-03-13 VITALS — BP 126/80 | HR 124 | Ht 64.0 in | Wt 196.0 lb

## 2018-03-13 DIAGNOSIS — D869 Sarcoidosis, unspecified: Secondary | ICD-10-CM | POA: Diagnosis not present

## 2018-03-13 DIAGNOSIS — R059 Cough, unspecified: Secondary | ICD-10-CM

## 2018-03-13 DIAGNOSIS — M1990 Unspecified osteoarthritis, unspecified site: Secondary | ICD-10-CM

## 2018-03-13 DIAGNOSIS — R05 Cough: Secondary | ICD-10-CM

## 2018-03-13 NOTE — Patient Instructions (Signed)
Continue ibuprofen as needed for joint pain and discomfort  Begin Asmanex inhaler, 2 actuations each morning.  Rinse mouth after use  Follow-up in 2 to 3 months with CXR prior to that visit

## 2018-03-13 NOTE — Progress Notes (Signed)
Patient ID: Elaine Melendez, female   DOB: [birth year], 62 y.o.   MRN: [id number]   Patient seen in the office today and instructed on use of Asmanex.  Patient expressed understanding and demonstrated technique.

## 2018-03-15 ENCOUNTER — Encounter: Payer: Self-pay | Admitting: Pulmonary Disease

## 2018-03-15 NOTE — Progress Notes (Signed)
PULMONARY CONSULT NOTE  Requesting MD/Service: Genevive Bi (primary MD: Brita Romp) Date of initial consultation: 02/19/18 Reason for consultation: Bulky mediastinal lymphadenopathy  PT PROFILE: 62 y.o. female never smoker referred for evaluation of incidental finding of mediastinal LAN. Initial presentation of polyarthralgias (principally ankles) and erythema nodosum on B shins  DATA: 12/20/10 CT chest: normal 01/30/18 CT chest: extensive paratracheal, mediastinal and subcarinal lymphadenopathy. Lung fields clear 02/27/18 EBUS: diffuse airway erythema. Granulomatous inflammation in R paratracheal and subcarinal lymph nodes  SUBJ:  Here to review results of EBUS. EN lesions on shins are improved. Arthralgias persist but are +/- improved. Now reports NP cough and generalized fatigue. Denies CP, fever, purulent sputum, hemoptysis, LE edema and calf tenderness.   OBJ: Vitals:   03/13/18 0913 03/13/18 0917  BP:  126/80  Pulse:  (!) 124  SpO2:  94%  Weight: 196 lb (88.9 kg)   Height: 5\' 4"  (1.626 m)   RA   EXAM:  Gen: NAD HEENT: NCAT, sclera white Neck: No JVD Lungs: breath sounds full, no wheezes or other adventitious sounds Cardiovascular: RRR, no murmurs Abdomen: Soft, nontender, normal BS Ext: without clubbing, cyanosis, edema Neuro: grossly intact Skin: mildly erythematous nodular lesion just above L ankle - improved from previously   DATA:   BMP Latest Ref Rng & Units 02/22/2018 02/02/2018 12/05/2017  Glucose 65 - 99 mg/dL - 128(H) 107(H)  BUN 6 - 20 mg/dL - 18 8  Creatinine 0.44 - 1.00 mg/dL - 0.92 0.65  BUN/Creat Ratio 12 - 28 - - 12  Sodium 135 - 145 mmol/L - 134(L) 135  Potassium 3.5 - 5.1 mmol/L 3.5 3.2(L) 3.2(L)  Chloride 101 - 111 mmol/L - 97(L) 96  CO2 22 - 32 mmol/L - 23 23  Calcium 8.9 - 10.3 mg/dL - 9.7 9.2    CBC Latest Ref Rng & Units 02/02/2018 12/05/2017  WBC 3.6 - 11.0 K/uL 13.7(H) 14.0(H)  Hemoglobin 12.0 - 16.0 g/dL 16.0 13.4  Hematocrit 35.0 - 47.0 %  45.6 39.6  Platelets 150 - 440 K/uL 375 253    CXR:  No new film  I have personally reviewed all chest radiographs reported above including CXRs and CT chest unless otherwise indicated  IMPRESSION:     ICD-10-CM   1. Lofgren's syndrome D86.9 DG Chest 2 View  2. Sarcoidosis D86.9 DG Chest 2 View  3. Cough R05   4. Arthralgias M19.90    Her presentation is classic for Lofgren's syndrome and the biopsy results are supportive of this diagnosis  Bronchoscopy results suggest bronchitis due to sarcoidosis. This should respond to ICS  PLAN:  We discussed the diagnosis and I explained that it is self limited and should run its course in the next weeks to months.  Continue ibuprofen as needed for joint pain and discomfort  Begin Asmanex inhaler, 2 actuations each morning.  Rinse mouth after use  Follow-up in 2 to 3 months with CXR prior to that visit  Merton Border, MD PCCM service Mobile [phone] Pager [phone] 03/15/2018 10:47 PM

## 2018-03-16 ENCOUNTER — Ambulatory Visit: Payer: Self-pay | Admitting: Cardiothoracic Surgery

## 2018-03-17 ENCOUNTER — Other Ambulatory Visit: Payer: Self-pay | Admitting: Family Medicine

## 2018-03-20 LAB — CULTURE, FUNGUS WITHOUT SMEAR: Special Requests: NORMAL

## 2018-03-27 ENCOUNTER — Ambulatory Visit: Payer: BLUE CROSS/BLUE SHIELD | Admitting: Cardiothoracic Surgery

## 2018-03-27 ENCOUNTER — Encounter: Payer: Self-pay | Admitting: Cardiothoracic Surgery

## 2018-03-27 VITALS — BP 126/83 | HR 99 | Temp 98.7°F | Wt 195.0 lb

## 2018-03-27 DIAGNOSIS — R590 Localized enlarged lymph nodes: Secondary | ICD-10-CM | POA: Diagnosis not present

## 2018-03-27 NOTE — Patient Instructions (Signed)
We will schedule you an appointment to be seen by Dr. Dahlia Byes in 2 weeks.

## 2018-03-27 NOTE — Progress Notes (Signed)
  Patient ID: Elaine Melendez, female   DOB: 02-14-1956, 62 y.o.   MRN: [id number]  HISTORY: She returns today in follow-up.  She has continued cough and occasional shortness of breath.  She states that at times her cough is very severe and almost takes her breath away.  She otherwise is not short of breath when she is not coughing and has had no hemoptysis fever or chills.  Her cough is nonproductive.  She tolerated her endobronchial ultrasound without difficulty.   Vitals:   03/27/18 1454  BP: 126/83  Pulse: 99  Temp: 98.7 F (37.1 C)     EXAM:    Resp: Lungs are clear bilaterally.  No respiratory distress, normal effort. Heart:  Regular without murmurs Abd:  Abdomen is soft, non distended and non tender. No masses are palpable.  There is no rebound and no guarding.  Neurological: Alert and oriented to person, place, and time. Coordination normal.  Skin: Skin is warm and dry. No rash noted. No diaphoretic. No erythema. No pallor.  Psychiatric: Normal mood and affect. Normal behavior. Judgment and thought content normal.    ASSESSMENT: I have reviewed the pathology.  This shows granulomatous inflammation in the subcarinal nodes.  I believe that this is all consistent with sarcoidosis.   PLAN:   She will continue her follow-up with our pulmonary medicine colleagues.  I did not make a return visit for her as there did not appear to be any surgical intervention needed.    Nestor Lewandowsky, MD

## 2018-04-02 DIAGNOSIS — H8109 Meniere's disease, unspecified ear: Secondary | ICD-10-CM | POA: Diagnosis not present

## 2018-04-03 ENCOUNTER — Telehealth: Payer: Self-pay | Admitting: Internal Medicine

## 2018-04-03 NOTE — Telephone Encounter (Signed)
Pt states she has been coughing every night for 2-3 hours and asks if she can use her inhaler,  at night. Please call to discuss

## 2018-04-03 NOTE — Telephone Encounter (Signed)
Asmanex is a once a day medication. She may take an extra dose (2 actuations) tonight, then switch to taking it at bedtime only starting tomorrow night to see if that helps  Waunita Schooner

## 2018-04-03 NOTE — Telephone Encounter (Signed)
Pt advised.

## 2018-04-03 NOTE — Telephone Encounter (Signed)
Pt is using Asmanex inhaler 2 puffs daily (morning) Pt is having night-time cough lasting 2-3 hrs. Pt wants to know if she can use inhaler at night also or something else for night-time. Please advise.

## 2018-04-11 ENCOUNTER — Encounter: Payer: Self-pay | Admitting: Surgery

## 2018-04-11 ENCOUNTER — Ambulatory Visit (INDEPENDENT_AMBULATORY_CARE_PROVIDER_SITE_OTHER): Payer: BLUE CROSS/BLUE SHIELD | Admitting: Surgery

## 2018-04-11 ENCOUNTER — Other Ambulatory Visit
Admission: RE | Admit: 2018-04-11 | Discharge: 2018-04-11 | Disposition: A | Discharge status: Home / Self Care | Payer: BLUE CROSS/BLUE SHIELD | Source: Ambulatory Visit | Attending: Surgery | Admitting: Surgery

## 2018-04-11 VITALS — BP 146/81 | HR 112 | Temp 97.1°F | Ht 64.0 in | Wt 192.4 lb

## 2018-04-11 DIAGNOSIS — E041 Nontoxic single thyroid nodule: Secondary | ICD-10-CM | POA: Diagnosis not present

## 2018-04-11 LAB — TSH: TSH: 1.796 u[IU]/mL (ref 0.350–4.500)

## 2018-04-11 NOTE — Patient Instructions (Addendum)
Please stop by the [facility]  lab today to have blood drawn. Your Ultrasound is scheduled for 04/23/18 @ 2pm.  Please see your follow up appointment listed below.

## 2018-04-11 NOTE — Progress Notes (Signed)
Patient ID: Elaine Melendez, female   DOB: May 04, 1956, 62 y.o.   MRN: [id number]  HPI Elaine Melendez is a 62 y.o. female in consultation at the request of Dr. Faith Rogue for a left thyroid nodule that was found incidentally on CT scan of the chest.  Of the cough diagnosis a chest x-ray show evidence of paratracheal opacity that prompted a CT scan of the chest.  I have personally reviewed this.  There is a simple cyst on the left side no evidence of complexity.  No evidence of metastatic disease.  No lymphadenopathy.  Have mediastinal lymphadenopathy that was biopsied with endobronchial ultrasound.  Path shows granulomatous inflammatory component consistent with sarcoidosis. He does have intermittent cough but denies any pain.  No swallowing issue.  No dysphagia.  No neck pain and does not feel any lumps in her neck.  Does feel some subcutaneous nodules on the right leg and right arm.  HPI  Past Medical History:  Diagnosis Date  . Anxiety   . Cannot sleep 05/10/2017  . Dense breast tissue 05/25/2017  . Dysrhythmia    remote hs tachycardia>15 yrs ago, no meds  . GERD (gastroesophageal reflux disease)   . Headache   . Hypercholesteremia 01/31/2007  . Joint effusion 12/05/2017  . Left breast mass 06/21/2017  . Meniere's disease   . Polyarthralgia 12/05/2017    Past Surgical History:  Procedure Laterality Date  . ANKLE FRACTURE SURGERY Right   . BREAST LUMPECTOMY    . CHOLECYSTECTOMY    . ENDOBRONCHIAL ULTRASOUND N/A 02/27/2018   Procedure: ENDOBRONCHIAL ULTRASOUND;  Surgeon: Laverle Hobby, MD;  Location: [facility] ORS;  Service: Pulmonary;  Laterality: N/A;  . RADIOACTIVE SEED GUIDED EXCISIONAL BREAST BIOPSY Left 09/12/2017   Procedure: 2 RADIOACTIVE SEED GUIDED EXCISIONAL LEFT BREAST BIOPSY ERAS PATHWAY;  Surgeon: Stark Klein, MD;  Location: [place];  Service: General;  Laterality: Left;    Family History  Problem Relation Age of Onset  . Breast cancer Paternal Aunt   .  Breast cancer Maternal Grandmother     Social History Social History   Tobacco Use  . Smoking status: Never Smoker  . Smokeless tobacco: Never Used  Substance Use Topics  . Alcohol use: No    Alcohol/week: 0.0 standard drinks  . Drug use: No    Allergies  Allergen Reactions  . Nickel Rash    Current Outpatient Medications  Medication Sig Dispense Refill  . ALPRAZolam (XANAX) 0.5 MG tablet Take 0.5-1 tablets (0.25-0.5 mg total) by mouth 2 (two) times daily as needed for anxiety. 30 tablet 1  . ibuprofen (ADVIL,MOTRIN) 200 MG tablet Take 400 mg by mouth daily as needed for moderate pain.     . meclizine (ANTIVERT) 12.5 MG tablet Take 12.5 mg by mouth 2 (two) times daily as needed for dizziness.     . naratriptan (AMERGE) 2.5 MG tablet Take 2.5 mg by mouth every 4 (four) hours as needed for migraine.   1  . promethazine (PHENERGAN) 25 MG tablet Take 25 mg by mouth every 6 (six) hours as needed for nausea or vomiting.     . Pseudoeph-Doxylamine-DM-APAP (NYQUIL PO) Take 1 Dose by mouth at bedtime as needed (cough).    . venlafaxine (EFFEXOR) 75 MG tablet Take 1 tablet (75 mg total) by mouth daily. 90 tablet 1   No current facility-administered medications for this visit.      Review of Systems Full ROS  was asked and was negative except for the  information on the HPI  Physical Exam Blood pressure (!) 146/81, pulse (!) 112, temperature (!) 97.1 F (36.2 C), temperature source Temporal, height 5\' 4"  (1.626 m), weight 192 lb 6.4 oz (87.3 kg). CONSTITUTIONAL: NAD EYES: Pupils are equal, round, and reactive to light, Sclera are non-icteric. EARS, NOSE, MOUTH AND THROAT: The oropharynx is clear. The oral mucosa is pink and moist. Hearing is intact to voice. NECK: supple, no thyromegaly or palpable nodules. No LAD LYMPH NODES:  Lymph nodes in the neck are normal. RESPIRATORY:  Lungs are clear. There is normal respiratory effort, with equal breath sounds bilaterally, and without  pathologic use of accessory muscles. CARDIOVASCULAR: Heart is regular without murmurs, gallops, or rubs. GI: The abdomen is soft, nontender, and nondistended. There are no palpable masses. There is no hepatosplenomegaly. There are normal bowel sounds in all quadrants. GU: Rectal deferred.   MUSCULOSKELETAL: Normal muscle strength and tone. No cyanosis or edema.   SKIN: Turgor is good and there are no pathologic skin lesions or ulcers. NEUROLOGIC: Motor and sensation is grossly normal. Cranial nerves are grossly intact. PSYCH:  Oriented to person, place and time. Affect is normal.  Data Reviewed  I have personally reviewed the patient's imaging, laboratory findings and medical records.    Assessment/Plan 62 year old female with incidental left thyroid nodule.  We will start work-up including TSH disease 3 and T4 and obtain an ultrasound of the thyroid as a baseline.  Do not think that this is a neoplastic issue but I will see her back and we will review her ultrasound with her in about 3 to 4 weeks.  No need for emergent surgical intervention at this time. Discussed with Dr. Genevive Bi in detail  [name], MD [place] Surgeon 04/11/2018, 10:48 AM

## 2018-04-12 LAB — T3: T3, Total: 138 ng/dL (ref 71–180)

## 2018-04-12 LAB — ACID FAST CULTURE WITH REFLEXED SENSITIVITIES (MYCOBACTERIA)

## 2018-04-12 LAB — ACID FAST CULTURE WITH REFLEXED SENSITIVITIES: ACID FAST CULTURE - AFSCU3: NEGATIVE

## 2018-04-12 LAB — T4: T4 TOTAL: 10 ug/dL (ref 4.5–12.0)

## 2018-04-23 ENCOUNTER — Ambulatory Visit
Admission: RE | Admit: 2018-04-23 | Discharge: 2018-04-23 | Disposition: A | Discharge status: Home / Self Care | Payer: BLUE CROSS/BLUE SHIELD | Source: Ambulatory Visit | Attending: Surgery | Admitting: Surgery

## 2018-04-23 DIAGNOSIS — E041 Nontoxic single thyroid nodule: Secondary | ICD-10-CM | POA: Insufficient documentation

## 2018-04-23 DIAGNOSIS — E042 Nontoxic multinodular goiter: Secondary | ICD-10-CM | POA: Diagnosis not present

## 2018-04-24 ENCOUNTER — Encounter: Payer: Self-pay | Admitting: Family Medicine

## 2018-04-24 ENCOUNTER — Ambulatory Visit: Payer: BLUE CROSS/BLUE SHIELD | Admitting: Family Medicine

## 2018-04-24 VITALS — BP 122/80 | HR 98 | Temp 98.2°F | Wt 190.6 lb

## 2018-04-24 DIAGNOSIS — E7800 Pure hypercholesterolemia, unspecified: Secondary | ICD-10-CM

## 2018-04-24 DIAGNOSIS — D869 Sarcoidosis, unspecified: Secondary | ICD-10-CM

## 2018-04-24 DIAGNOSIS — Z23 Encounter for immunization: Secondary | ICD-10-CM | POA: Diagnosis not present

## 2018-04-24 DIAGNOSIS — F419 Anxiety disorder, unspecified: Secondary | ICD-10-CM

## 2018-04-24 DIAGNOSIS — R5383 Other fatigue: Secondary | ICD-10-CM | POA: Insufficient documentation

## 2018-04-24 DIAGNOSIS — F329 Major depressive disorder, single episode, unspecified: Secondary | ICD-10-CM

## 2018-04-24 DIAGNOSIS — F32A Depression, unspecified: Secondary | ICD-10-CM

## 2018-04-24 DIAGNOSIS — R634 Abnormal weight loss: Secondary | ICD-10-CM

## 2018-04-24 NOTE — Progress Notes (Signed)
Patient: Elaine Melendez Female    DOB: 1956-06-06   62 y.o.   MRN: [id number] Visit Date: 04/24/2018  Today's Provider: Lavon Paganini, MD   Chief Complaint  Patient presents with  . Anxiety  . Depression   Subjective:    I, Tiburcio Pea, CMA, am acting as a scribe for Lavon Paganini, MD.   HPI Anxiety & Depression:  Patient presents today for a 8 week follow up. Last OV was on 02/19/2018. Symptoms were worse due to medical problems. Patient advised to increase Effexor to 75 mg daily, Use Xanax 0.25-0.5 mg BID PRN, and follow up in 8 weeks. Patient reports good complaince with treatment plan. She states symptoms have improved. She used the Xanax a couple times to help with sleep.  She has not needed Xanax recently.  She feels her Effexor is controlling her symptoms well.  Lofgrens syndrome: Patient is being followed by pulmonology and rheumatology.  Her cough has finally improved.  Her joint pain is significantly better.  She does still have one erythema nodosum on her right elbow.  Overall, she is feeling much improved.  She is feeling significant fatigue recently.  She is also undergoing a work-up for incidentally found thyroid nodules.  She has had unintentional weight loss over the last several months.  Her weight has decreased from 213 at the beginning of the year to 190 pounds today.  Patient is also concerned about upper to mid back pain.  Nothing seems to make this worse.  Is been present for several weeks.  She has not thought much of it because it gets better when she uses a heating pad.  She thinks is related to the chronic cough she has had with her Lofgren syndrome.      Allergies  Allergen Reactions  . Nickel Rash     Current Outpatient Medications:  .  ALPRAZolam (XANAX) 0.5 MG tablet, Take 0.5-1 tablets (0.25-0.5 mg total) by mouth 2 (two) times daily as needed for anxiety., Disp: 30 tablet, Rfl: 1 .  ibuprofen (ADVIL,MOTRIN) 200 MG tablet, Take 400 mg by  mouth daily as needed for moderate pain. , Disp: , Rfl:  .  naratriptan (AMERGE) 2.5 MG tablet, Take 2.5 mg by mouth every 4 (four) hours as needed for migraine. , Disp: , Rfl: 1 .  promethazine (PHENERGAN) 25 MG tablet, Take 25 mg by mouth every 6 (six) hours as needed for nausea or vomiting. , Disp: , Rfl:  .  Pseudoeph-Doxylamine-DM-APAP (NYQUIL PO), Take 1 Dose by mouth at bedtime as needed (cough)., Disp: , Rfl:  .  venlafaxine (EFFEXOR) 75 MG tablet, Take 1 tablet (75 mg total) by mouth daily., Disp: 90 tablet, Rfl: 1  Review of Systems  Constitutional: Negative.   Respiratory: Negative.   Cardiovascular: Negative.   Musculoskeletal: Negative.   Neurological: Negative.   Psychiatric/Behavioral: Negative for decreased concentration, dysphoric mood, hallucinations, self-injury, sleep disturbance and suicidal ideas. The patient is nervous/anxious. The patient is not hyperactive.        Depression     Social History   Tobacco Use  . Smoking status: Never Smoker  . Smokeless tobacco: Never Used  Substance Use Topics  . Alcohol use: No    Alcohol/week: 0.0 standard drinks   Objective:   BP 122/80 (BP Location: Left Arm, Patient Position: Sitting, Cuff Size: Normal)   Pulse 98   Temp 98.2 F (36.8 C) (Oral)   Wt 190 lb 9.6 oz (86.5 kg)  LMP  (LMP Unknown)   SpO2 98%   BMI 32.72 kg/m  Vitals:   04/24/18 1034  BP: 122/80  Pulse: 98  Temp: 98.2 F (36.8 C)  TempSrc: Oral  SpO2: 98%  Weight: 190 lb 9.6 oz (86.5 kg)     Physical Exam  Constitutional: She is oriented to person, place, and time. She appears well-developed and well-nourished. No distress.  HENT:  Head: Normocephalic and atraumatic.  Eyes: Conjunctivae are normal.  Neck: Neck supple. No thyromegaly present.  Cardiovascular: Normal rate, regular rhythm, normal heart sounds and intact distal pulses.  No murmur heard. Pulmonary/Chest: Effort normal and breath sounds normal. No respiratory distress. She has  no wheezes. She has no rales.  Musculoskeletal: She exhibits no edema.  Lymphadenopathy:    She has no cervical adenopathy.  Neurological: She is alert and oriented to person, place, and time.  Skin: Skin is warm and dry. Capillary refill takes less than 2 seconds. No rash noted.  1 EN over R elbow  Psychiatric: She has a normal mood and affect. Her behavior is normal.  Vitals reviewed.       Assessment & Plan:   Problem List Items Addressed This Visit      Musculoskeletal and Integument   Lofgren's syndrome    Managed by pulmonology and rheumatology Not on any medications, as this is typically self-limited Seems to be improving        Other   Hypercholesteremia    Not currently on a statin Recheck lipid panel and CMP      Relevant Orders   Comprehensive metabolic panel   Lipid panel   Anxiety and depression - Primary    Was recently exacerbated by multiple medical issues She is still undergoing work-up for several different issues that have been found within the last year She is doing well now on her increased dose of Effexor, which we will continue She can hold onto the Xanax to use very sparingly if she has any episodes of acute anxiety She nor I plan for the benzo therapy to be a long-term No SI/HI-contracted for safety Discussed return precautions      Fatigue    Suspect this is related to her multiple medical issues recently We will check labs Recent TSH within normal limits Continue to monitor      Relevant Orders   CBC   Comprehensive metabolic panel   Unintentional weight loss    Suspect this is related to her multitude of new medical issues this year Will check some screening labs Recent TSH within normal limits Continue to monitor       Other Visit Diagnoses    Need for influenza vaccination       Relevant Orders   Flu Vaccine QUAD 6+ mos PF IM (Fluarix Quad PF) (Completed)       Return in about 6 months (around 10/23/2018) for  physical.   The entirety of the information documented in the History of Present Illness, Review of Systems and Physical Exam were personally obtained by me. Portions of this information were initially documented by Tiburcio Pea, CMA and reviewed by me for thoroughness and accuracy.    Virginia Crews, MD, MPH [facility] 04/25/2018 2:22 PM

## 2018-04-25 NOTE — Assessment & Plan Note (Signed)
Was recently exacerbated by multiple medical issues She is still undergoing work-up for several different issues that have been found within the last year She is doing well now on her increased dose of Effexor, which we will continue She can hold onto the Xanax to use very sparingly if she has any episodes of acute anxiety She nor I plan for the benzo therapy to be a long-term No SI/HI-contracted for safety Discussed return precautions

## 2018-04-25 NOTE — Assessment & Plan Note (Signed)
Managed by pulmonology and rheumatology Not on any medications, as this is typically self-limited Seems to be improving

## 2018-04-25 NOTE — Assessment & Plan Note (Signed)
Suspect this is related to her multitude of new medical issues this year Will check some screening labs Recent TSH within normal limits Continue to monitor

## 2018-04-25 NOTE — Assessment & Plan Note (Signed)
Suspect this is related to her multiple medical issues recently We will check labs Recent TSH within normal limits Continue to monitor

## 2018-04-25 NOTE — Assessment & Plan Note (Signed)
Not currently on a statin Recheck lipid panel and CMP 

## 2018-04-26 ENCOUNTER — Telehealth: Payer: Self-pay

## 2018-04-26 NOTE — Telephone Encounter (Signed)
-----   Message from Jules Husbands, MD sent at 04/24/2018  8:06 PM EDT ----- Please let her know that u/s shows benign  Findings. She may continue f/w appt w me ----- Message ----- From: Interface, Rad Results In Sent: 04/23/2018   2:51 PM EDT To: Jules Husbands, MD

## 2018-04-26 NOTE — Telephone Encounter (Signed)
Called patient to let her know about her ultrasound results. I also reminded patient about her appointment with Dr. Dahlia Byes on 04/30/2018. Patient agreed.

## 2018-04-30 ENCOUNTER — Encounter: Payer: Self-pay | Admitting: Surgery

## 2018-04-30 ENCOUNTER — Ambulatory Visit: Payer: BLUE CROSS/BLUE SHIELD | Admitting: Surgery

## 2018-04-30 VITALS — BP 145/82 | HR 97 | Temp 97.8°F | Wt 190.0 lb

## 2018-04-30 DIAGNOSIS — E041 Nontoxic single thyroid nodule: Secondary | ICD-10-CM | POA: Diagnosis not present

## 2018-04-30 NOTE — Patient Instructions (Signed)
We will see you back in 1 year to make sure that we follow up on your thyroid.  We will contact you to remind you.

## 2018-04-30 NOTE — Progress Notes (Signed)
Outpatient Surgical Follow Up  04/30/2018  Elaine Melendez is an 62 y.o. female.   Chief Complaint  Patient presents with  . Follow-up    Thyroid cyst- discuss ultrasound and labs    HPI: And is following up after ultrasound of the thyroid was performed and pers. reviewed showing 2 benign nodules on each side of her thyroid.  No criteria for biopsy.  No suspicious for malignancy. Is doing okay without any hoarseness or any compression symptoms   Past Medical History:  Diagnosis Date  . Anxiety   . Cannot sleep 05/10/2017  . Dense breast tissue 05/25/2017  . Dysrhythmia    remote hs tachycardia>15 yrs ago, no meds  . GERD (gastroesophageal reflux disease)   . Headache   . Hypercholesteremia 01/31/2007  . Joint effusion 12/05/2017  . Left breast mass 06/21/2017  . Meniere's disease   . Polyarthralgia 12/05/2017    Past Surgical History:  Procedure Laterality Date  . ANKLE FRACTURE SURGERY Right   . BREAST LUMPECTOMY    . CHOLECYSTECTOMY    . ENDOBRONCHIAL ULTRASOUND N/A 02/27/2018   Procedure: ENDOBRONCHIAL ULTRASOUND;  Surgeon: Laverle Hobby, MD;  Location: [facility] ORS;  Service: Pulmonary;  Laterality: N/A;  . RADIOACTIVE SEED GUIDED EXCISIONAL BREAST BIOPSY Left 09/12/2017   Procedure: 2 RADIOACTIVE SEED GUIDED EXCISIONAL LEFT BREAST BIOPSY ERAS PATHWAY;  Surgeon: Stark Klein, MD;  Location: [place];  Service: General;  Laterality: Left;    Family History  Problem Relation Age of Onset  . Breast cancer Paternal Aunt   . Breast cancer Maternal Grandmother     Social History:  reports that she has never smoked. She has never used smokeless tobacco. She reports that she does not drink alcohol or use drugs.  Allergies:  Allergies  Allergen Reactions  . Nickel Rash    Medications reviewed.    ROS Full ROS performed and is otherwise negative other than what is stated in HPI   BP (!) 145/82   Pulse 97   Temp 97.8 F (36.6 C) (Skin)   Wt  86.2 kg   LMP  (LMP Unknown)   BMI 32.61 kg/m   Physical Exam  Constitutional: She is oriented to person, place, and time. She appears well-developed and well-nourished. No distress.  HENT:  Head: Normocephalic and atraumatic.  Eyes: EOM are normal. Right eye exhibits no discharge. Left eye exhibits no discharge. No scleral icterus.  Neck: Normal range of motion. Neck supple. No JVD present. No tracheal deviation present. No thyromegaly present.  Pulmonary/Chest: Effort normal. No respiratory distress.  Abdominal: Soft. She exhibits no distension and no mass. There is no tenderness. There is no rebound and no guarding.  Lymphadenopathy:    She has no cervical adenopathy.  Neurological: She is alert and oriented to person, place, and time.  Skin: Skin is warm and dry. She is not diaphoretic.  Psychiatric: She has a normal mood and affect. Her behavior is normal. Thought content normal.  Nursing note and vitals reviewed.      Assessment/Plan: Benign Thyroid Nodules.  No need for surgical intervention or further biopsies.  We will follow her with an annual ultrasound thyroid.  No need for any surgical intervention at this time.  [name], MD [facility] General Surgeon

## 2018-05-10 DIAGNOSIS — R5383 Other fatigue: Secondary | ICD-10-CM | POA: Diagnosis not present

## 2018-05-10 DIAGNOSIS — R739 Hyperglycemia, unspecified: Secondary | ICD-10-CM | POA: Diagnosis not present

## 2018-05-10 DIAGNOSIS — E7800 Pure hypercholesterolemia, unspecified: Secondary | ICD-10-CM | POA: Diagnosis not present

## 2018-05-11 ENCOUNTER — Telehealth: Payer: Self-pay

## 2018-05-11 LAB — CBC
HEMATOCRIT: 39.8 % (ref 34.0–46.6)
Hemoglobin: 13.1 g/dL (ref 11.1–15.9)
MCH: 27.8 pg (ref 26.6–33.0)
MCHC: 32.9 g/dL (ref 31.5–35.7)
MCV: 85 fL (ref 79–97)
PLATELETS: 316 10*3/uL (ref 150–450)
RBC: 4.71 x10E6/uL (ref 3.77–5.28)
RDW: 12.3 % (ref 12.3–15.4)
WBC: 9 10*3/uL (ref 3.4–10.8)

## 2018-05-11 LAB — LIPID PANEL
CHOLESTEROL TOTAL: 203 mg/dL — AB (ref 100–199)
Chol/HDL Ratio: 4.7 ratio — ABNORMAL HIGH (ref 0.0–4.4)
HDL: 43 mg/dL (ref >39)
LDL CALC: 133 mg/dL — AB (ref 0–99)
Triglycerides: 134 mg/dL (ref 0–149)
VLDL CHOLESTEROL CAL: 27 mg/dL (ref 5–40)

## 2018-05-11 LAB — COMPREHENSIVE METABOLIC PANEL
A/G RATIO: 1.6 (ref 1.2–2.2)
ALK PHOS: 137 IU/L — AB (ref 39–117)
ALT: 11 IU/L (ref 0–32)
AST: 14 IU/L (ref 0–40)
Albumin: 4.1 g/dL (ref 3.6–4.8)
BUN / CREAT RATIO: 13 (ref 12–28)
BUN: 7 mg/dL — AB (ref 8–27)
Bilirubin Total: 0.3 mg/dL (ref 0.0–1.2)
CO2: 23 mmol/L (ref 20–29)
CREATININE: 0.55 mg/dL — AB (ref 0.57–1.00)
Calcium: 9.5 mg/dL (ref 8.7–10.3)
Chloride: 102 mmol/L (ref 96–106)
GFR calc Af Amer: 115 mL/min/{1.73_m2} (ref >59)
GFR calc non Af Amer: 99 mL/min/{1.73_m2} (ref >59)
GLOBULIN, TOTAL: 2.6 g/dL (ref 1.5–4.5)
Glucose: 110 mg/dL — ABNORMAL HIGH (ref 65–99)
Potassium: 3.1 mmol/L — ABNORMAL LOW (ref 3.5–5.2)
Sodium: 145 mmol/L — ABNORMAL HIGH (ref 134–144)
Total Protein: 6.7 g/dL (ref 6.0–8.5)

## 2018-05-11 MED ORDER — POTASSIUM CHLORIDE CRYS ER 20 MEQ PO TBCR: 20.0000 meq | EXTENDED_RELEASE_TABLET | Freq: Every day | ORAL | 3 refills | Status: DC

## 2018-05-11 NOTE — Telephone Encounter (Signed)
-----   Message from Virginia Crews, MD sent at 05/11/2018  2:15 PM EDT ----- Normal blood counts and kidney and liver function.  Blood sugar is elevated.  Wonder if the patient was fasting when she had her labs drawn.  If she was, would like to add on a hemoglobin A1c.  Sodium is slightly elevated.  Important to stay well-hydrated.  Potassium is slightly low at 3.1, which is a recurrent problem.  Recommend taking potassium supplement, KDur 20 mEq daily.  We can send a prescription for this.  We should recheck her potassium in 2 weeks after taking this.  Cholesterol is high, but 10 year risk of heart disease/stroke is okay at 5.3 %.  Recommend diet low in saturated fat and regular exercise-30 minutes a day at least 5 times a week.  Virginia Crews, MD, MPH [facility] 05/11/2018 2:15 PM

## 2018-05-14 LAB — HGB A1C W/O EAG: Hgb A1c MFr Bld: 5 % (ref 4.8–5.6)

## 2018-05-14 LAB — SPECIMEN STATUS REPORT

## 2018-05-15 ENCOUNTER — Telehealth: Payer: Self-pay

## 2018-05-15 NOTE — Telephone Encounter (Signed)
Patient was advised of lab results on 05/11/2018. RX sent to pharmacy. A1C was added and results came back today normal.

## 2018-05-15 NOTE — Telephone Encounter (Signed)
-----   Message from Carmon Ginsberg, [place] sent at 05/14/2018  4:07 PM EDT ----- Normal A1C

## 2018-05-15 NOTE — Telephone Encounter (Signed)
Pt.[email or web address].KW 

## 2018-06-19 ENCOUNTER — Ambulatory Visit
Admission: RE | Admit: 2018-06-19 | Discharge: 2018-06-19 | Disposition: A | Discharge status: Home / Self Care | Payer: BLUE CROSS/BLUE SHIELD | Source: Ambulatory Visit | Attending: Pulmonary Disease | Admitting: Pulmonary Disease

## 2018-06-19 ENCOUNTER — Encounter: Payer: Self-pay | Admitting: Pulmonary Disease

## 2018-06-19 ENCOUNTER — Ambulatory Visit: Payer: BLUE CROSS/BLUE SHIELD | Admitting: Pulmonary Disease

## 2018-06-19 VITALS — BP 126/68 | HR 107 | Resp 16 | Ht 64.0 in | Wt 188.0 lb

## 2018-06-19 DIAGNOSIS — R918 Other nonspecific abnormal finding of lung field: Secondary | ICD-10-CM | POA: Insufficient documentation

## 2018-06-19 DIAGNOSIS — R591 Generalized enlarged lymph nodes: Secondary | ICD-10-CM | POA: Diagnosis not present

## 2018-06-19 DIAGNOSIS — D869 Sarcoidosis, unspecified: Secondary | ICD-10-CM

## 2018-06-19 DIAGNOSIS — J42 Unspecified chronic bronchitis: Secondary | ICD-10-CM

## 2018-06-19 DIAGNOSIS — R599 Enlarged lymph nodes, unspecified: Secondary | ICD-10-CM

## 2018-06-19 MED ORDER — MOMETASONE FUROATE 200 MCG/ACT IN AERO: 1.0000 | INHALATION_SPRAY | Freq: Every day | RESPIRATORY_TRACT | 5 refills | Status: DC

## 2018-06-19 NOTE — Patient Instructions (Addendum)
Continue Asmanex.  May change to 1 inhalation daily at bedtime. PFTs ordered Follow-up in 3-4 months with repeat CXR prior to that visit

## 2018-06-19 NOTE — Progress Notes (Signed)
PULMONARY OFFICE FOLLOW-UP NOTE  Requesting MD/Service: Genevive Bi (primary MD: Brita Romp) Date of initial consultation: 02/19/18 Reason for consultation: Bulky mediastinal lymphadenopathy  PT PROFILE: 62 y.o. female never smoker referred for evaluation of incidental finding of mediastinal LAN. Initial presentation of polyarthralgias (principally ankles) and erythema nodosum on B shins  DATA: 12/20/10 CT chest: normal 01/30/18 CT chest: extensive paratracheal, mediastinal and subcarinal lymphadenopathy. Lung fields clear 02/27/18 EBUS: diffuse airway erythema. Granulomatous inflammation in R paratracheal and subcarinal lymph nodes   SUBJ:  This is a scheduled follow-up.  Since last visit 03/13/2018, she is much improved.  She has minimal chest and respiratory symptoms.  She describes only very mild cough.  Her arthralgias are all but resolved.  The erythema nodosum has resolved.  She remains on Asmanex which she believes helps her cough and wishes to continue this medication.   OBJ: Vitals:   06/19/18 1008 06/19/18 1019  BP:  126/68  Pulse:  (!) 107  Resp: 16   SpO2:  97%  Weight: 188 lb (85.3 kg)   Height: 5\' 4"  (1.626 m)   RA   EXAM:  Gen: NAD HEENT: NCAT, sclera white Neck: No JVD Lungs: breath sounds full, no wheezes or other adventitious sounds Cardiovascular: RRR, no murmurs Abdomen: Soft, nontender, normal BS Ext: without clubbing, cyanosis, edema Neuro: grossly intact Skin: Limited exam, no lesions noted   DATA:   BMP Latest Ref Rng & Units 05/10/2018 02/22/2018 02/02/2018  Glucose 65 - 99 mg/dL 110(H) - 128(H)  BUN 8 - 27 mg/dL 7(L) - 18  Creatinine 0.57 - 1.00 mg/dL 0.55(L) - 0.92  BUN/Creat Ratio 12 - 28 13 - -  Sodium 134 - 144 mmol/L 145(H) - 134(L)  Potassium 3.5 - 5.2 mmol/L 3.1(L) 3.5 3.2(L)  Chloride 96 - 106 mmol/L 102 - 97(L)  CO2 20 - 29 mmol/L 23 - 23  Calcium 8.7 - 10.3 mg/dL 9.5 - 9.7    CBC Latest Ref Rng & Units 05/10/2018 02/02/2018 12/05/2017   WBC 3.4 - 10.8 x10E3/uL 9.0 13.7(H) 14.0(H)  Hemoglobin 11.1 - 15.9 g/dL 13.1 16.0 13.4  Hematocrit 34.0 - 46.6 % 39.8 45.6 39.6  Platelets 150 - 450 x10E3/uL 316 375 253    CXR 06/19/18: The R paratracheal adenopathy is improved but not fully resolved.  The fullness in the AP window has resolved.  Lung fields are normal.  Cardiac silhouette is normal.  I have personally reviewed all chest radiographs reported above including CXRs and CT chest unless otherwise indicated  IMPRESSION:     ICD-10-CM   1. Lofgren's syndrome (sarcoidosis variant) D86.9 DG Chest 2 View    Pulmonary Function Test [facility] Only  2. Mediastinal lymphadenopathy, improved, not fully resolved R59.1 DG Chest 2 View    Pulmonary Function Test [facility] Only  3.  Bronchitis related to sarcoidosis with major symptom of cough   PLAN:  Continue Asmanex inhaler.  She may try decreasing this to 1 inhalation nightly PFTs ordered Follow-up in 3 to 4 months with repeat CXR prior to that visit  Merton Border, MD PCCM service Mobile [phone] Pager [phone] 06/19/2018 3:29 PM

## 2018-07-18 DIAGNOSIS — H25813 Combined forms of age-related cataract, bilateral: Secondary | ICD-10-CM | POA: Diagnosis not present

## 2018-07-24 ENCOUNTER — Other Ambulatory Visit: Payer: Self-pay | Admitting: Family Medicine

## 2018-08-23 ENCOUNTER — Other Ambulatory Visit: Payer: Self-pay | Admitting: Family Medicine

## 2018-08-29 DIAGNOSIS — L821 Other seborrheic keratosis: Secondary | ICD-10-CM | POA: Diagnosis not present

## 2018-08-29 DIAGNOSIS — D485 Neoplasm of uncertain behavior of skin: Secondary | ICD-10-CM | POA: Diagnosis not present

## 2018-08-29 DIAGNOSIS — L308 Other specified dermatitis: Secondary | ICD-10-CM | POA: Diagnosis not present

## 2018-09-11 HISTORY — PX: BREAST EXCISIONAL BIOPSY: SUR124

## 2018-10-16 ENCOUNTER — Ambulatory Visit: Payer: BLUE CROSS/BLUE SHIELD | Attending: Pulmonary Disease

## 2018-10-16 DIAGNOSIS — R591 Generalized enlarged lymph nodes: Secondary | ICD-10-CM | POA: Diagnosis not present

## 2018-10-16 DIAGNOSIS — R599 Enlarged lymph nodes, unspecified: Secondary | ICD-10-CM

## 2018-10-16 DIAGNOSIS — D869 Sarcoidosis, unspecified: Secondary | ICD-10-CM | POA: Diagnosis not present

## 2018-10-16 MED ORDER — ALBUTEROL SULFATE (2.5 MG/3ML) 0.083% IN NEBU
2.5000 mg | INHALATION_SOLUTION | Freq: Once | RESPIRATORY_TRACT | Status: AC
  Administered 2018-10-16: 2.5 mg via RESPIRATORY_TRACT
  Filled 2018-10-16: qty 3

## 2018-10-18 ENCOUNTER — Ambulatory Visit
Admission: RE | Admit: 2018-10-18 | Discharge: 2018-10-18 | Disposition: A | Discharge status: Home / Self Care | Payer: Self-pay | Source: Ambulatory Visit | Attending: Pulmonary Disease | Admitting: Pulmonary Disease

## 2018-10-18 ENCOUNTER — Other Ambulatory Visit: Payer: Self-pay

## 2018-10-18 ENCOUNTER — Encounter: Payer: Self-pay | Admitting: Pulmonary Disease

## 2018-10-18 ENCOUNTER — Ambulatory Visit (INDEPENDENT_AMBULATORY_CARE_PROVIDER_SITE_OTHER): Payer: Self-pay | Admitting: Pulmonary Disease

## 2018-10-18 VITALS — BP 134/76 | HR 80 | Ht 64.0 in | Wt 195.8 lb

## 2018-10-18 DIAGNOSIS — R599 Enlarged lymph nodes, unspecified: Secondary | ICD-10-CM

## 2018-10-18 DIAGNOSIS — R059 Cough, unspecified: Secondary | ICD-10-CM

## 2018-10-18 DIAGNOSIS — D869 Sarcoidosis, unspecified: Secondary | ICD-10-CM

## 2018-10-18 DIAGNOSIS — R591 Generalized enlarged lymph nodes: Secondary | ICD-10-CM | POA: Insufficient documentation

## 2018-10-18 DIAGNOSIS — R05 Cough: Secondary | ICD-10-CM

## 2018-10-18 NOTE — Patient Instructions (Signed)
You may try discontinuation of steroid inhaler (Asmanex) and remain off of it if your cough does not recur.  If cough does recur, you may resume the Asmanex as previously prescribed.  Follow-up in this office as needed for any breathing, respiratory, chest, lung problems

## 2018-10-19 ENCOUNTER — Ambulatory Visit: Payer: Self-pay | Admitting: Pulmonary Disease

## 2018-10-19 NOTE — Progress Notes (Signed)
PULMONARY OFFICE FOLLOW-UP NOTE  Requesting MD/Service: Genevive Bi (primary MD: Brita Romp) Date of initial consultation: 02/19/18 Reason for consultation: Bulky mediastinal lymphadenopathy  PT PROFILE: 63 y.o. female never smoker referred for evaluation of incidental finding of mediastinal LAN. Initial presentation of polyarthralgias (principally ankles) and erythema nodosum on B shins  DATA: 12/20/10 CT chest: normal 01/30/18 CT chest: extensive paratracheal, mediastinal and subcarinal lymphadenopathy. Lung fields clear 02/27/18 EBUS: diffuse airway erythema. Granulomatous inflammation in R paratracheal and subcarinal lymph nodes 10/16/18 PFTs: Normal spirometry, normal lung volumes, normal DLCO, normal flow volume loop   SUBJ:  This is a scheduled follow-up.  Last visit was in November 2019.  Since that time she has no new complaints.  Her joint problems have resolved.  Erythema nodosum has resolved.  She has no respiratory complaints.  She remains on Asmanex inhaler using it compliantly every day.   OBJ: Vitals:   10/18/18 1028 10/18/18 1030  BP:  134/76  Pulse:  80  SpO2:  97%  Weight: 195 lb 12.8 oz (88.8 kg)   Height: 5\' 4"  (1.626 m)   RA   EXAM:  Gen: NAD HEENT: NCAT, sclerae white Neck: No JVD Lungs: breath sounds full, no wheezes or other adventitious sounds Cardiovascular: RRR, no murmurs Abdomen: Soft, nontender, normal BS Ext: without clubbing, cyanosis, edema Neuro: grossly intact Skin: Limited exam, no lesions noted   DATA:   BMP Latest Ref Rng & Units 05/10/2018 02/22/2018 02/02/2018  Glucose 65 - 99 mg/dL 110(H) - 128(H)  BUN 8 - 27 mg/dL 7(L) - 18  Creatinine 0.57 - 1.00 mg/dL 0.55(L) - 0.92  BUN/Creat Ratio 12 - 28 13 - -  Sodium 134 - 144 mmol/L 145(H) - 134(L)  Potassium 3.5 - 5.2 mmol/L 3.1(L) 3.5 3.2(L)  Chloride 96 - 106 mmol/L 102 - 97(L)  CO2 20 - 29 mmol/L 23 - 23  Calcium 8.7 - 10.3 mg/dL 9.5 - 9.7    CBC Latest Ref Rng & Units 05/10/2018  02/02/2018 12/05/2017  WBC 3.4 - 10.8 x10E3/uL 9.0 13.7(H) 14.0(H)  Hemoglobin 11.1 - 15.9 g/dL 13.1 16.0 13.4  Hematocrit 34.0 - 46.6 % 39.8 45.6 39.6  Platelets 150 - 450 x10E3/uL 316 375 253    CXR/5/20: R paratracheal adenopathy has resolved.  There is no evidence of lymphadenopathy.  Lung fields are clear.  This is a normal chest x-ray.  I have personally reviewed all chest radiographs reported above including CXRs and CT chest unless otherwise indicated  IMPRESSION:     ICD-10-CM   1. Lofgren's syndrome, resolved D86.9   2. Cough R05   3.  Bronchitis related to sarcoidosis with major symptom of cough   At this point, it is unclear that she is obtaining any benefit from ICS.  Cough is resolved and all of the components of Lofgren's syndrome have resolved.  PLAN:  I instructed that she may try discontinuation of Asmanex inhaler.  She may remain off of this medication as long as her cough or any other respiratory symptoms do not recur.  If she does have recurrent cough (or shortness of breath) she may resume Asmanex inhaler as previously prescribed.  Follow-up in this office as needed for any respiratory or chest symptoms.  Merton Border, MD PCCM service Mobile [phone] Pager [phone] 10/19/2018 8:28 AM

## 2018-10-24 ENCOUNTER — Telehealth: Payer: Self-pay | Admitting: *Deleted

## 2018-10-24 ENCOUNTER — Encounter: Payer: BLUE CROSS/BLUE SHIELD | Admitting: Family Medicine

## 2018-10-24 NOTE — Telephone Encounter (Signed)
error 

## 2019-02-18 NOTE — Progress Notes (Signed)
Patient: Elaine Melendez, Female    DOB: April 18, 1956, 63 y.o.   MRN: [id number] Visit Date: 02/19/2019  Today's Provider: Lavon Paganini, MD   Chief Complaint  Patient presents with  . Annual Exam   Subjective:    I,Elaine Melendez,RMA am acting as a scribe for Lavon Paganini, MD.   Annual physical exam Elaine Melendez is a 63 y.o. female who presents today for health maintenance and complete physical. She feels well. She reports exercising none. She reports she is sleeping well.   Patient is s/p L breast lumpectomy that showed benign pathology Annual screening mammogram H/o colon polyps with last colonoscopy in 06/2014 and need to repeat in 5 yrs Due for pap smear today -----------------------------------------------------------------   Review of Systems  Constitutional: Negative.   HENT: Negative.   Eyes: Negative.   Respiratory: Negative.   Cardiovascular: Negative.   Gastrointestinal: Negative.   Endocrine: Negative.   Genitourinary: Negative.   Musculoskeletal: Negative.   Skin: Negative.   Allergic/Immunologic: Negative.   Neurological: Negative.   Hematological: Negative.   Psychiatric/Behavioral: Negative.     Social History      She  reports that she has never smoked. She has never used smokeless tobacco. She reports that she does not drink alcohol or use drugs.       Social History   Socioeconomic History  . Marital status: Married    Spouse name: Not on file  . Number of children: Not on file  . Years of education: Not on file  . Highest education level: Not on file  Occupational History  . Not on file  Social Needs  . Financial resource strain: Not on file  . Food insecurity    Worry: Not on file    Inability: Not on file  . Transportation needs    Medical: Not on file    Non-medical: Not on file  Tobacco Use  . Smoking status: Never Smoker  . Smokeless tobacco: Never Used  Substance and Sexual Activity  . Alcohol use: No   Alcohol/week: 0.0 standard drinks  . Drug use: No  . Sexual activity: Not on file  Lifestyle  . Physical activity    Days per week: Not on file    Minutes per session: Not on file  . Stress: Not on file  Relationships  . Social Herbalist on phone: Not on file    Gets together: Not on file    Attends religious service: Not on file    Active member of club or organization: Not on file    Attends meetings of clubs or organizations: Not on file    Relationship status: Not on file  Other Topics Concern  . Not on file  Social History Narrative  . Not on file    Past Medical History:  Diagnosis Date  . Anxiety   . Cannot sleep 05/10/2017  . Dense breast tissue 05/25/2017  . Dysrhythmia    remote hs tachycardia>15 yrs ago, no meds  . GERD (gastroesophageal reflux disease)   . Headache   . Hypercholesteremia 01/31/2007  . Joint effusion 12/05/2017  . Left breast mass 06/21/2017  . Meniere's disease   . Polyarthralgia 12/05/2017     Patient Active Problem List   Diagnosis Date Noted  . Lofgren's syndrome 04/24/2018  . Fatigue 04/24/2018  . Unintentional weight loss 04/24/2018  . Gravida 0 02/06/2018  . Left breast mass 06/21/2017  . Dense breast  tissue 05/25/2017  . Cannot sleep 05/10/2017  . Anxiety and depression 02/28/2007  . Hypercholesteremia 01/31/2007  . Auditory vertigo 01/31/2007  . Common migraine with intractable migraine 01/31/2007  . History of colon polyps 01/31/2007    Past Surgical History:  Procedure Laterality Date  . ANKLE FRACTURE SURGERY Right   . BREAST LUMPECTOMY    . CHOLECYSTECTOMY    . ENDOBRONCHIAL ULTRASOUND N/A 02/27/2018   Procedure: ENDOBRONCHIAL ULTRASOUND;  Surgeon: Laverle Hobby, MD;  Location: [facility];  Service: Pulmonary;  Laterality: N/A;  . RADIOACTIVE SEED GUIDED EXCISIONAL BREAST BIOPSY Left 09/12/2017   Procedure: 2 RADIOACTIVE SEED GUIDED EXCISIONAL LEFT BREAST BIOPSY ERAS PATHWAY;  Surgeon: Stark Klein, MD;   Location: [facility];  Service: General;  Laterality: Left;    Family History        Family Status  Relation Name Status  . Ethlyn Daniels  (Not Specified)  . MGM  (Not Specified)        Her family history includes Breast cancer in her maternal grandmother and paternal aunt.      Allergies  Allergen Reactions  . Nickel Rash     Current Outpatient Medications:  .  ibuprofen (ADVIL,MOTRIN) 200 MG tablet, Take 400 mg by mouth daily as needed for moderate pain. , Disp: , Rfl:  .  KLOR-CON M20 20 MEQ tablet, TAKE 1 TABLET BY MOUTH EVERY DAY, Disp: 90 tablet, Rfl: 1 .  naratriptan (AMERGE) 2.5 MG tablet, Take 2.5 mg by mouth every 4 (four) hours as needed for migraine. , Disp: , Rfl: 1 .  promethazine (PHENERGAN) 25 MG tablet, Take 25 mg by mouth every 6 (six) hours as needed for nausea or vomiting. , Disp: , Rfl:  .  venlafaxine (EFFEXOR) 75 MG tablet, TAKE 1 TABLET BY MOUTH EVERY DAY, Disp: 90 tablet, Rfl: 1 .  Mometasone Furoate (ASMANEX HFA) 200 MCG/ACT AERO, Inhale 1 puff into the lungs at bedtime. (Patient not taking: Reported on 02/19/2019), Disp: 1 Inhaler, Rfl: 5 .  Pseudoeph-Doxylamine-DM-APAP (NYQUIL PO), Take 1 Dose by mouth at bedtime as needed (cough)., Disp: , Rfl:    Patient Care Team: Virginia Crews, MD as PCP - General (Family Medicine)    Objective:    Vitals: BP 138/76 (BP Location: Right Arm, Patient Position: Sitting, Cuff Size: Large)   Pulse 75   Temp 97.8 F (36.6 C) (Oral)   Resp 16   Ht 5\' 4"  (1.626 m)   Wt 207 lb 9.6 oz (94.2 kg)   LMP  (LMP Unknown)   BMI 35.63 kg/m    Vitals:   02/19/19 1005  BP: 138/76  Pulse: 75  Resp: 16  Temp: 97.8 F (36.6 C)  TempSrc: Oral  Weight: 207 lb 9.6 oz (94.2 kg)  Height: 5\' 4"  (1.626 m)     Physical Exam Constitutional:      Appearance: Normal appearance.  HENT:     Head: Normocephalic and atraumatic.     Right Ear: Tympanic membrane, ear canal and external ear normal.     Left Ear:  Tympanic membrane, ear canal and external ear normal.     Nose: Nose normal.     Mouth/Throat:     Mouth: Mucous membranes are moist.     Pharynx: Oropharynx is clear.  Eyes:     Extraocular Movements: Extraocular movements intact.     Conjunctiva/sclera: Conjunctivae normal.     Pupils: Pupils are equal, round, and reactive to light.  Neck:  Musculoskeletal: Normal range of motion and neck supple.  Cardiovascular:     Rate and Rhythm: Normal rate and regular rhythm.     Pulses: Normal pulses.     Heart sounds: Normal heart sounds.  Pulmonary:     Effort: Pulmonary effort is normal.     Breath sounds: Normal breath sounds.  Abdominal:     General: Bowel sounds are normal.  Genitourinary:    Comments: Breasts: breasts appear normal, no suspicious masses, no skin or nipple changes or axillary nodes.  GYN:  External genitalia within normal limits.  Vaginal mucosa pink, moist, normal rugae.  Nonfriable cervix without lesions, no discharge or bleeding noted on speculum exam.  Bimanual exam revealed normal, nongravid uterus.  No cervical motion tenderness. No adnexal masses bilaterally.    Musculoskeletal: Normal range of motion.  Skin:    General: Skin is warm and dry.  Neurological:     Mental Status: She is alert and oriented to person, place, and time.  Psychiatric:        Mood and Affect: Mood normal.        Behavior: Behavior normal.        Thought Content: Thought content normal.        Judgment: Judgment normal.      Depression Screen PHQ 2/9 Scores 02/19/2019 04/24/2018 02/19/2018 04/04/2017  PHQ - 2 Score 0 0 4 0  PHQ- 9 Score 0 3 14 -       Assessment & Plan:     Routine Health Maintenance and Physical Exam  Exercise Activities and Dietary recommendations Goals   None     Immunization History  Administered Date(s) Administered  . Influenza,inj,Quad PF,6+ Mos 06/26/2013, 04/24/2018    Health Maintenance  Topic Date Due  . Hepatitis C Screening   [birth year]  . HIV Screening  04/08/1969  . TETANUS/TDAP  04/08/1973  . INFLUENZA VACCINE  03/16/2019  . MAMMOGRAM  04/14/2019  . PAP SMEAR-Modifier  04/24/2019  . COLONOSCOPY  07/08/2024     Discussed health benefits of physical activity, and encouraged her to engage in regular exercise appropriate for her age and condition.    --------------------------------------------------------------------  Problem List Items Addressed This Visit      Cardiovascular and Mediastinum   Lofgren's syndrome    Was managed by Pulm and Rheum Now in remission - typically self-limited Off of inhalers and breathing is stable        Endocrine   Thyroid nodule    Noted incidentally Benign Needs annual thyroid US f/u and TSH      Relevant Orders   TSH   US THYROID     Other   Hypercholesteremia    Not currently on a statin Repeat FLP nad CMP      Anxiety and depression    Chronic and well controlled Continue Effexor daily No SI Return precautions discussed       Other Visit Diagnoses    Encounter for annual physical exam    -  Primary   Relevant Orders   CBC with Differential/Platelet   Comprehensive metabolic panel   Hepatitis C antibody   HIV Antibody (routine testing w rflx)   Lipid panel   TSH   Screen for colon cancer       Relevant Orders   Ambulatory referral to Gastroenterology   Screening for breast cancer       Relevant Orders   MM 3D SCREEN BREAST BILATERAL   Screening for cervical cancer  Relevant Orders   Cytology - PAP   Need for diphtheria-tetanus-pertussis (Tdap) vaccine       Relevant Orders   Tdap vaccine greater than or equal to 7yo IM (Completed)   Need for shingles vaccine       Relevant Orders   Varicella-zoster vaccine IM (Completed)   Screening for HIV (human immunodeficiency virus)       Relevant Orders   HIV Antibody (routine testing w rflx)   Need for hepatitis C screening test       Relevant Orders   Hepatitis C antibody        Return in about 1 year (around 02/19/2020) for CPE.   The entirety of the information documented in the History of Present Illness, Review of Systems and Physical Exam were personally obtained by me. Portions of this information were initially documented by Lyndel Pleasure, CMA and reviewed by me for thoroughness and accuracy.    Dorsel Flinn, Dionne Bucy, MD MPH [facility]

## 2019-02-19 ENCOUNTER — Other Ambulatory Visit: Payer: Self-pay

## 2019-02-19 ENCOUNTER — Encounter: Payer: Self-pay | Admitting: Family Medicine

## 2019-02-19 ENCOUNTER — Ambulatory Visit (INDEPENDENT_AMBULATORY_CARE_PROVIDER_SITE_OTHER): Payer: BC Managed Care – PPO | Admitting: Family Medicine

## 2019-02-19 ENCOUNTER — Other Ambulatory Visit (HOSPITAL_COMMUNITY)
Admission: RE | Admit: 2019-02-19 | Discharge: 2019-02-19 | Disposition: A | Discharge status: Home / Self Care | Payer: BC Managed Care – PPO | Source: Ambulatory Visit | Attending: Family Medicine | Admitting: Family Medicine

## 2019-02-19 VITALS — BP 138/76 | HR 75 | Temp 97.8°F | Resp 16 | Ht 64.0 in | Wt 207.6 lb

## 2019-02-19 DIAGNOSIS — Z124 Encounter for screening for malignant neoplasm of cervix: Secondary | ICD-10-CM | POA: Diagnosis not present

## 2019-02-19 DIAGNOSIS — E7800 Pure hypercholesterolemia, unspecified: Secondary | ICD-10-CM | POA: Diagnosis not present

## 2019-02-19 DIAGNOSIS — Z114 Encounter for screening for human immunodeficiency virus [HIV]: Secondary | ICD-10-CM | POA: Diagnosis not present

## 2019-02-19 DIAGNOSIS — F419 Anxiety disorder, unspecified: Secondary | ICD-10-CM

## 2019-02-19 DIAGNOSIS — E041 Nontoxic single thyroid nodule: Secondary | ICD-10-CM | POA: Diagnosis not present

## 2019-02-19 DIAGNOSIS — F32A Depression, unspecified: Secondary | ICD-10-CM

## 2019-02-19 DIAGNOSIS — D869 Sarcoidosis, unspecified: Secondary | ICD-10-CM

## 2019-02-19 DIAGNOSIS — Z1239 Encounter for other screening for malignant neoplasm of breast: Secondary | ICD-10-CM

## 2019-02-19 DIAGNOSIS — Z1159 Encounter for screening for other viral diseases: Secondary | ICD-10-CM | POA: Diagnosis not present

## 2019-02-19 DIAGNOSIS — Z0000 Encounter for general adult medical examination without abnormal findings: Secondary | ICD-10-CM | POA: Diagnosis not present

## 2019-02-19 DIAGNOSIS — Z23 Encounter for immunization: Secondary | ICD-10-CM | POA: Diagnosis not present

## 2019-02-19 DIAGNOSIS — F329 Major depressive disorder, single episode, unspecified: Secondary | ICD-10-CM

## 2019-02-19 DIAGNOSIS — Z1211 Encounter for screening for malignant neoplasm of colon: Secondary | ICD-10-CM

## 2019-02-19 MED ORDER — NYSTATIN 100000 UNIT/GM EX POWD: Freq: Three times a day (TID) | CUTANEOUS | 3 refills | Status: DC

## 2019-02-19 NOTE — Assessment & Plan Note (Signed)
Not currently on a statin Repeat FLP nad CMP

## 2019-02-19 NOTE — Patient Instructions (Signed)

## 2019-02-19 NOTE — Assessment & Plan Note (Signed)
Noted incidentally Benign Needs annual thyroid US f/u and TSH

## 2019-02-19 NOTE — Assessment & Plan Note (Signed)
Was managed by Pulm and Rheum Now in remission - typically self-limited Off of inhalers and breathing is stable

## 2019-02-19 NOTE — Assessment & Plan Note (Signed)
Chronic and well controlled Continue Effexor daily No SI Return precautions discussed

## 2019-02-20 LAB — HIV ANTIBODY (ROUTINE TESTING W REFLEX): HIV Screen 4th Generation wRfx: NONREACTIVE

## 2019-02-20 LAB — CBC WITH DIFFERENTIAL/PLATELET
Basophils Absolute: 0.1 10*3/uL (ref 0.0–0.2)
Basos: 1 %
EOS (ABSOLUTE): 0.8 10*3/uL — ABNORMAL HIGH (ref 0.0–0.4)
Eos: 10 %
Hematocrit: 40.7 % (ref 34.0–46.6)
Hemoglobin: 14.4 g/dL (ref 11.1–15.9)
Immature Grans (Abs): 0 10*3/uL (ref 0.0–0.1)
Immature Granulocytes: 0 %
Lymphocytes Absolute: 3.1 10*3/uL (ref 0.7–3.1)
Lymphs: 36 %
MCH: 30.7 pg (ref 26.6–33.0)
MCHC: 35.4 g/dL (ref 31.5–35.7)
MCV: 87 fL (ref 79–97)
Monocytes Absolute: 0.6 10*3/uL (ref 0.1–0.9)
Monocytes: 7 %
Neutrophils Absolute: 3.9 10*3/uL (ref 1.4–7.0)
Neutrophils: 46 %
Platelets: 234 10*3/uL (ref 150–450)
RBC: 4.69 x10E6/uL (ref 3.77–5.28)
RDW: 11.8 % (ref 11.7–15.4)
WBC: 8.5 10*3/uL (ref 3.4–10.8)

## 2019-02-20 LAB — COMPREHENSIVE METABOLIC PANEL
ALT: 10 IU/L (ref 0–32)
AST: 10 IU/L (ref 0–40)
Albumin/Globulin Ratio: 2.4 — ABNORMAL HIGH (ref 1.2–2.2)
Albumin: 4.5 g/dL (ref 3.8–4.8)
Alkaline Phosphatase: 106 IU/L (ref 39–117)
BUN/Creatinine Ratio: 23 (ref 12–28)
BUN: 14 mg/dL (ref 8–27)
Bilirubin Total: 0.4 mg/dL (ref 0.0–1.2)
CO2: 23 mmol/L (ref 20–29)
Calcium: 9.4 mg/dL (ref 8.7–10.3)
Chloride: 104 mmol/L (ref 96–106)
Creatinine, Ser: 0.6 mg/dL (ref 0.57–1.00)
GFR calc Af Amer: 111 mL/min/{1.73_m2} (ref >59)
GFR calc non Af Amer: 97 mL/min/{1.73_m2} (ref >59)
Globulin, Total: 1.9 g/dL (ref 1.5–4.5)
Glucose: 95 mg/dL (ref 65–99)
Potassium: 3.5 mmol/L (ref 3.5–5.2)
Sodium: 144 mmol/L (ref 134–144)
Total Protein: 6.4 g/dL (ref 6.0–8.5)

## 2019-02-20 LAB — LIPID PANEL
Chol/HDL Ratio: 3.2 ratio (ref 0.0–4.4)
Cholesterol, Total: 204 mg/dL — ABNORMAL HIGH (ref 100–199)
HDL: 64 mg/dL (ref >39)
LDL Calculated: 120 mg/dL — ABNORMAL HIGH (ref 0–99)
Triglycerides: 98 mg/dL (ref 0–149)
VLDL Cholesterol Cal: 20 mg/dL (ref 5–40)

## 2019-02-20 LAB — TSH: TSH: 3 u[IU]/mL (ref 0.450–4.500)

## 2019-02-20 LAB — HEPATITIS C ANTIBODY: Hep C Virus Ab: <0.1 s/co ratio (ref 0.0–0.9)

## 2019-02-21 ENCOUNTER — Other Ambulatory Visit: Payer: Self-pay

## 2019-02-21 DIAGNOSIS — Z1211 Encounter for screening for malignant neoplasm of colon: Secondary | ICD-10-CM

## 2019-02-25 LAB — CYTOLOGY - PAP
Diagnosis: NEGATIVE
HPV: NOT DETECTED

## 2019-02-26 ENCOUNTER — Telehealth: Payer: Self-pay

## 2019-02-26 NOTE — Telephone Encounter (Signed)
-----   Message from Virginia Crews, MD sent at 02/25/2019  4:48 PM EDT ----- Normal Pap smear

## 2019-02-26 NOTE — Telephone Encounter (Signed)
Patient was advised.  

## 2019-02-27 ENCOUNTER — Telehealth: Payer: Self-pay

## 2019-02-27 ENCOUNTER — Ambulatory Visit
Admission: RE | Admit: 2019-02-27 | Discharge: 2019-02-27 | Disposition: A | Discharge status: Home / Self Care | Payer: BC Managed Care – PPO | Source: Ambulatory Visit | Attending: Family Medicine | Admitting: Family Medicine

## 2019-02-27 ENCOUNTER — Other Ambulatory Visit: Payer: Self-pay

## 2019-02-27 DIAGNOSIS — E041 Nontoxic single thyroid nodule: Secondary | ICD-10-CM

## 2019-02-27 NOTE — Telephone Encounter (Signed)
Patient notified of results.

## 2019-02-27 NOTE — Telephone Encounter (Signed)
-----   Message from Virginia Crews, MD sent at 02/27/2019  3:56 PM EDT ----- Stable nodules.  Repeat US in 1 yr.

## 2019-02-28 ENCOUNTER — Telehealth: Payer: Self-pay | Admitting: Gastroenterology

## 2019-02-28 ENCOUNTER — Other Ambulatory Visit: Payer: Self-pay | Admitting: Family Medicine

## 2019-02-28 NOTE — Telephone Encounter (Signed)
Patient called & states she was scheduled for a colonoscopy on 03-19-2019. She wanted to check to see which doctor she is schedule with because her pcp wanted her scheduled with Dr T & she has another doctor on her instructions.

## 2019-03-01 ENCOUNTER — Other Ambulatory Visit: Payer: Self-pay

## 2019-03-01 NOTE — Telephone Encounter (Signed)
Patients colonoscopy has been rescheduled to 03/27/19 physician changed to Dr. Bonna Gains.  New instructions sent, with COVID instruction page.  Thanks [employer]

## 2019-03-05 IMAGING — MG NEEDLE LOCALIZATION OF THE LEFT BREAST WITH MAMMO GUIDANCE
8 of 14 series · 8 of 14 positions shown · non-contrast
Comparison: Previous exam(s).

CLINICAL DATA: Recently diagnosed complex sclerosing lesion with
atypical ductal hyperplasia and flat epithelial atypia in the upper
central left breast and intraductal papilloma in the lower central
left breast.

EXAM:
MAMMOGRAPHIC GUIDED RADIOACTIVE SEED LOCALIZATION OF THE LEFT BREAST
X 2

[L ML (1 of 5)]
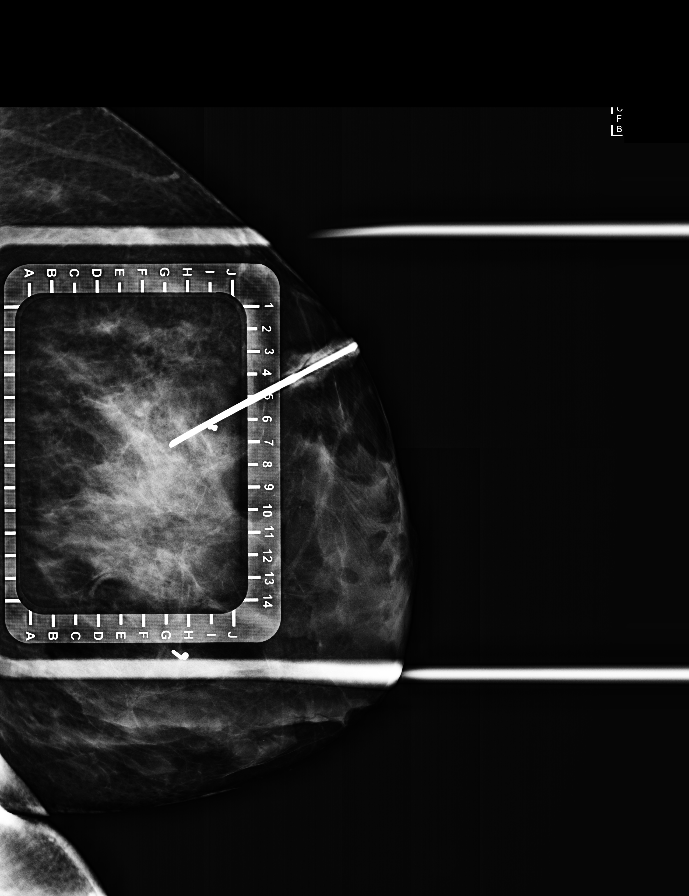

[L ML (2 of 5)]
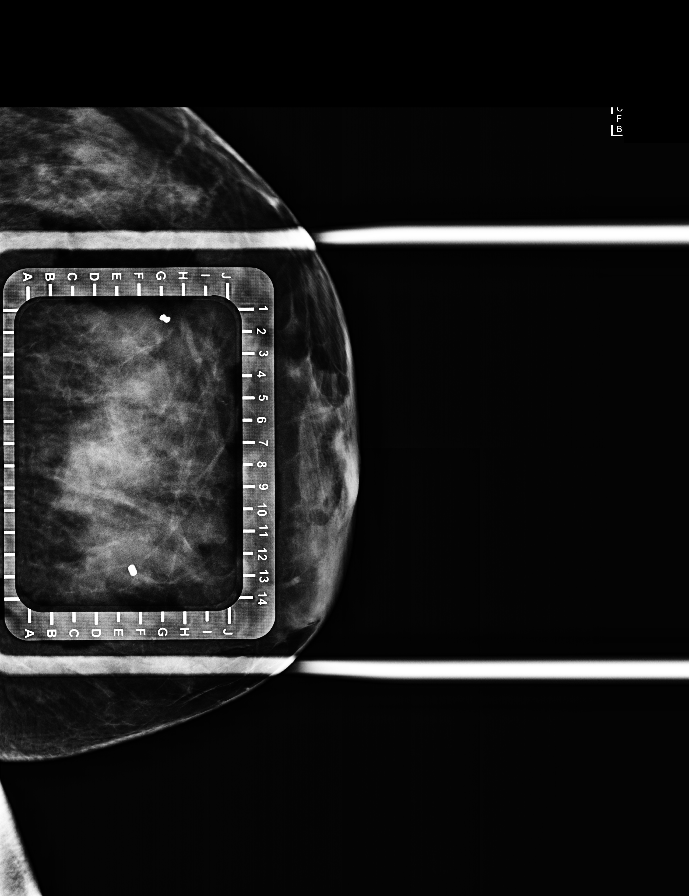

[L ML (3 of 5)]
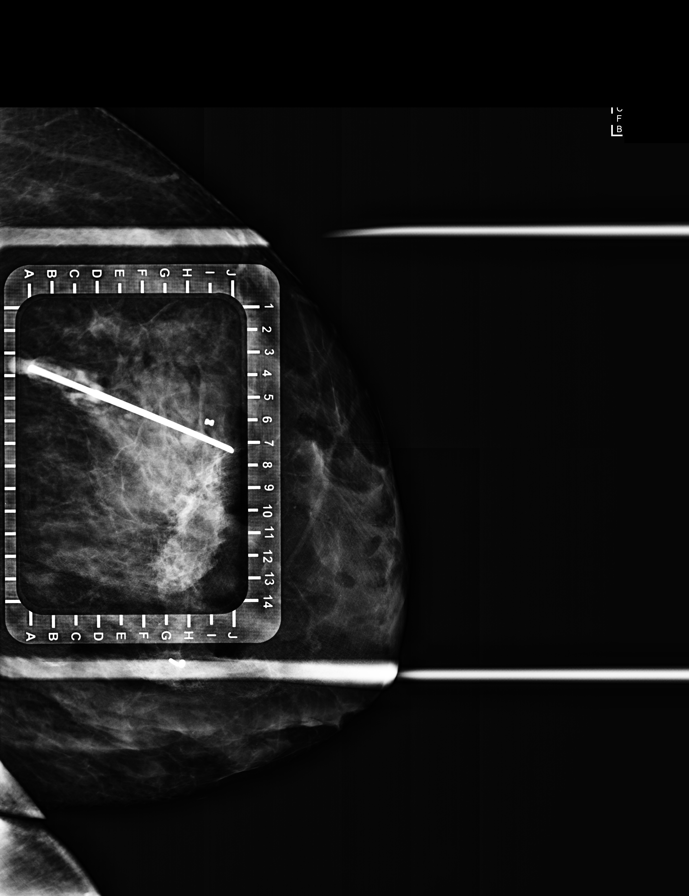

[L ML (4 of 5)]
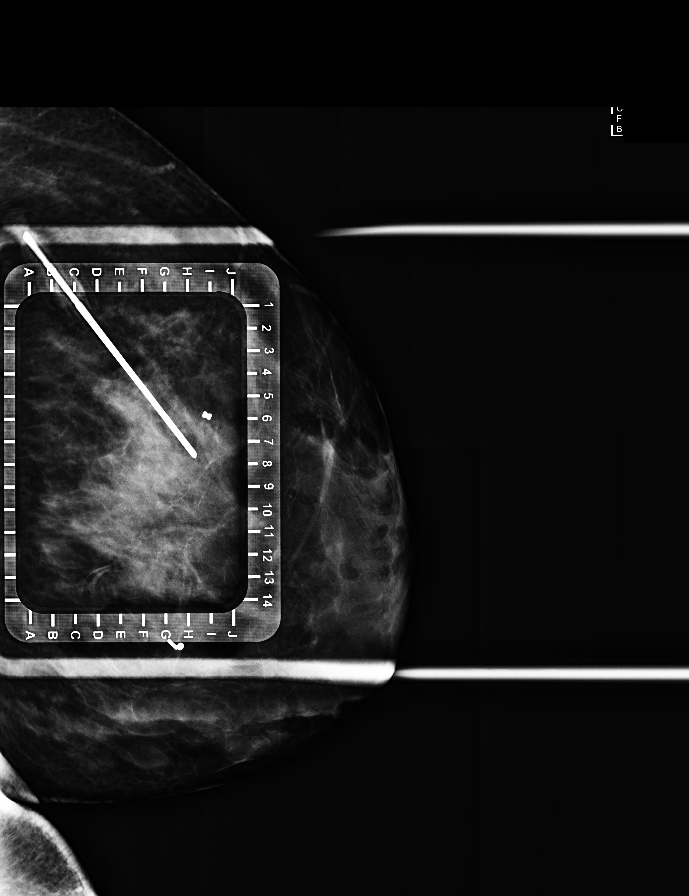

[L ML (5 of 5)]
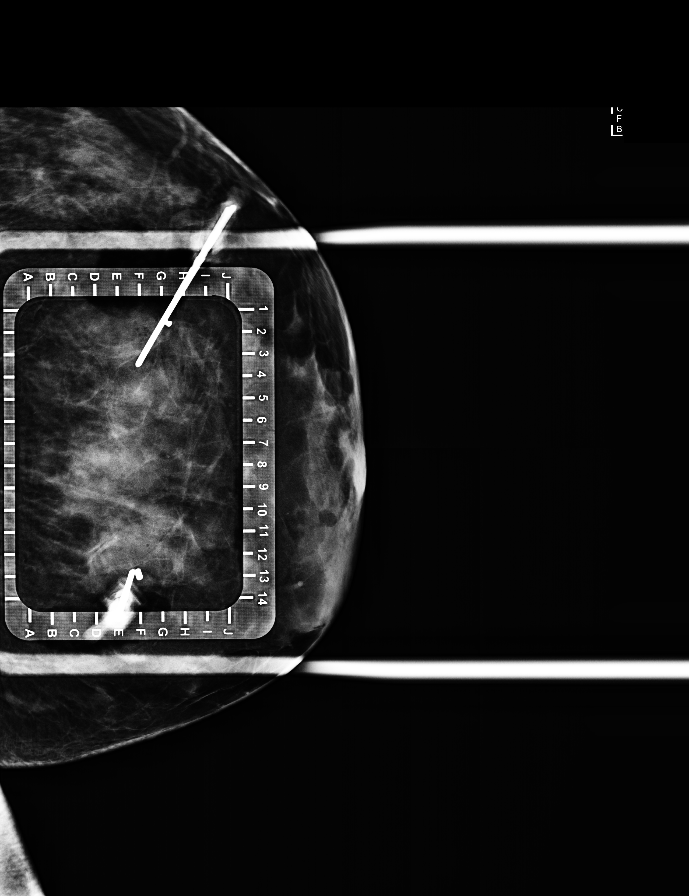

[L CC (1 of 3)]
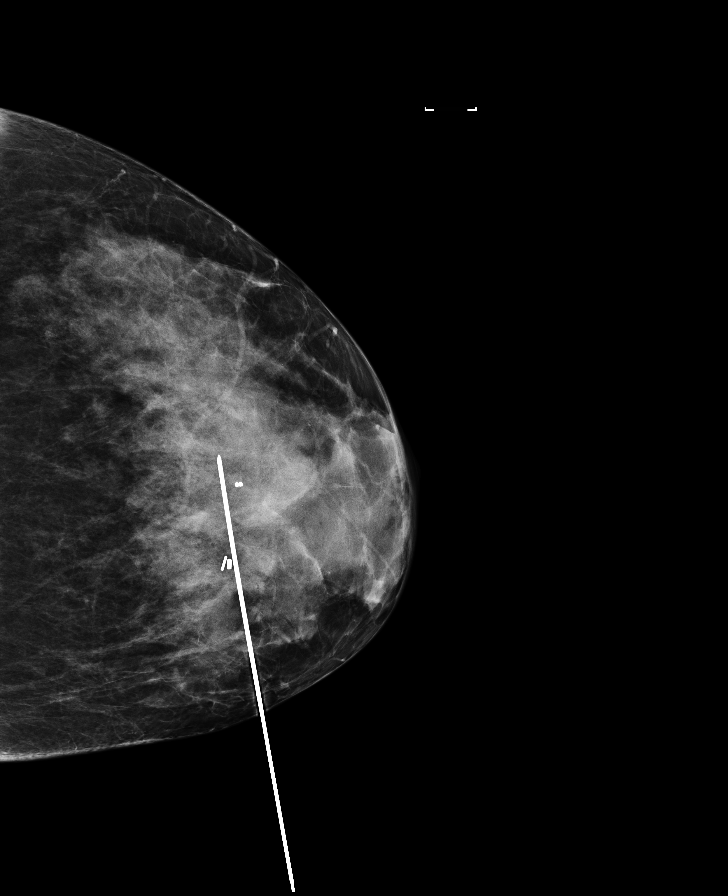

[L CC (2 of 3)]
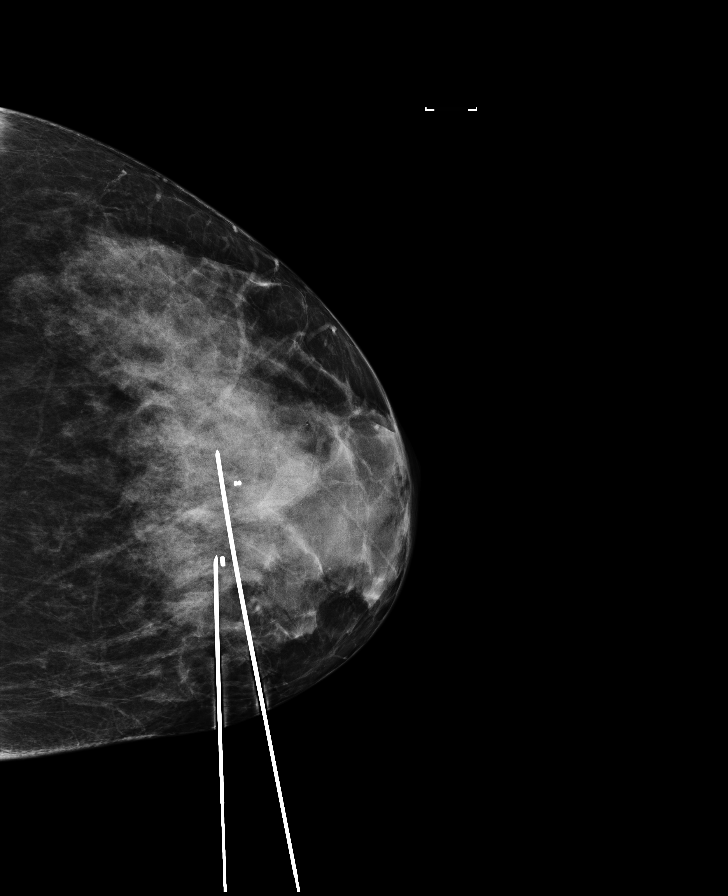

[L CC (3 of 3)]
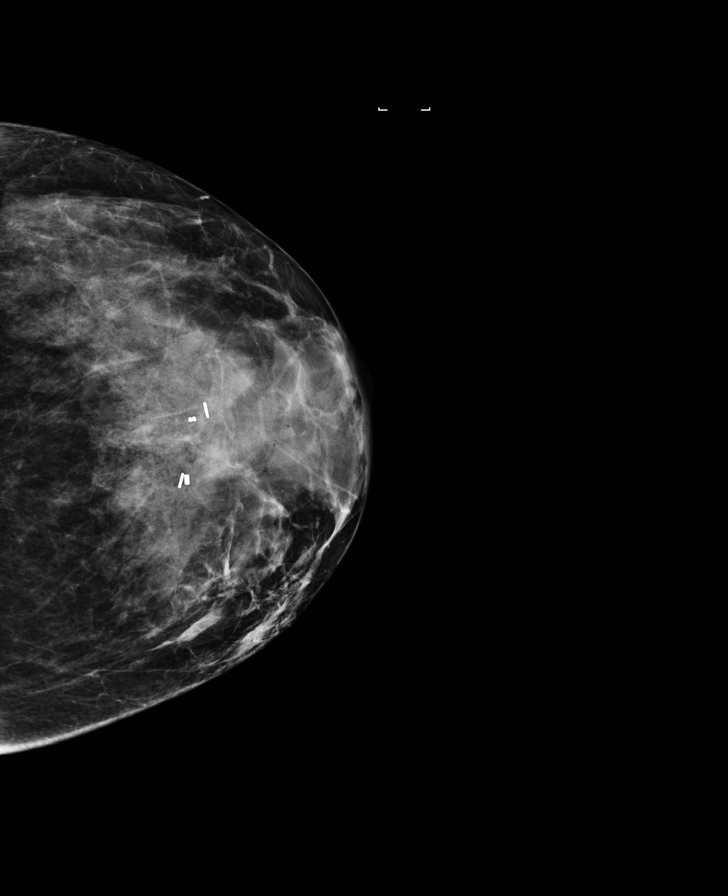

[8 of 14 positions shown; findings below may reference images not displayed]

FINDINGS: Patient presents for radioactive seed localizations prior to
excision of the complex sclerosing lesion with atypical ductal
hyperplasia and flat epithelial atypia in the upper central left
breast and excision of the intraductal papilloma in the lower
central left breast.

I met with the patient and we discussed the procedure of seed
localization including benefits and alternatives. We discussed the
high likelihood of a successful procedure. We discussed the risks of
the procedure including infection, bleeding, tissue injury and
further surgery. We discussed the low dose of radioactivity involved
in the procedure. Informed, written consent was given.

The usual time-out protocol was performed immediately prior to the
procedure.

Site 1 (Upper Central Left Breast):

Using mammographic guidance, sterile technique, 1% lidocaine and an
6-DFV radioactive seed, the recently placed dumbbell-shaped biopsy
marker clip in the upper central left breast was localized using a
medial approach. The follow-up mammogram images confirm the seed in
the expected location and were marked for Dr. Dupe.

Follow-up survey of the patient confirms presence of the radioactive
seed.

Order number of 6-DFV seed:  [id number].

Total activity:  0.253 mCi reference Date: 08/16/2017

Site 2 (Lower Central Left Breast):

Using mammographic guidance, sterile technique, 1% lidocaine and an
6-DFV radioactive seed, the recently placed cylinder shaped biopsy
marker clip in the lower central left breast was localized using a
medial approach. The follow-up mammogram images confirm the seed in
the expected location and were marked for Dr. Dupe.

Follow-up survey of the patient confirms presence of the radioactive
seed.

Order number of 6-DFV seed:  [id number].

Total activity:  0.253 mCi reference Date: 08/16/2017

The patient tolerated the procedures well and was released from the
[REDACTED]. She was given instructions regarding seed removal.
IMPRESSION: Radioactive seed localization left breast x 2. No apparent
complications.

## 2019-03-22 ENCOUNTER — Other Ambulatory Visit
Admission: RE | Admit: 2019-03-22 | Discharge: 2019-03-22 | Disposition: A | Discharge status: Home / Self Care | Payer: Medicare Other | Source: Ambulatory Visit | Attending: Gastroenterology | Admitting: Gastroenterology

## 2019-03-22 ENCOUNTER — Other Ambulatory Visit: Payer: Self-pay

## 2019-03-22 DIAGNOSIS — Z01812 Encounter for preprocedural laboratory examination: Secondary | ICD-10-CM | POA: Insufficient documentation

## 2019-03-22 DIAGNOSIS — Z20828 Contact with and (suspected) exposure to other viral communicable diseases: Secondary | ICD-10-CM | POA: Insufficient documentation

## 2019-03-22 LAB — SARS CORONAVIRUS 2 (TAT 6-24 HRS): SARS Coronavirus 2: NEGATIVE

## 2019-03-26 ENCOUNTER — Encounter: Payer: Self-pay | Admitting: *Deleted

## 2019-03-27 ENCOUNTER — Ambulatory Visit: Payer: Medicare Other | Admitting: Certified Registered"

## 2019-03-27 ENCOUNTER — Ambulatory Visit
Admission: RE | Admit: 2019-03-27 | Discharge: 2019-03-27 | Disposition: A | Discharge status: Home / Self Care | Payer: Medicare Other | Attending: Gastroenterology | Admitting: Gastroenterology

## 2019-03-27 ENCOUNTER — Encounter: Payer: Self-pay | Admitting: *Deleted

## 2019-03-27 ENCOUNTER — Encounter
Admission: RE | Disposition: A | Discharge status: Home / Self Care | Payer: Self-pay | Source: Home / Self Care | Attending: Gastroenterology

## 2019-03-27 DIAGNOSIS — K635 Polyp of colon: Secondary | ICD-10-CM | POA: Diagnosis not present

## 2019-03-27 DIAGNOSIS — F329 Major depressive disorder, single episode, unspecified: Secondary | ICD-10-CM | POA: Insufficient documentation

## 2019-03-27 DIAGNOSIS — Z86010 Personal history of colonic polyps: Secondary | ICD-10-CM | POA: Diagnosis not present

## 2019-03-27 DIAGNOSIS — Z79899 Other long term (current) drug therapy: Secondary | ICD-10-CM | POA: Insufficient documentation

## 2019-03-27 DIAGNOSIS — Z1211 Encounter for screening for malignant neoplasm of colon: Secondary | ICD-10-CM | POA: Diagnosis present

## 2019-03-27 DIAGNOSIS — G43909 Migraine, unspecified, not intractable, without status migrainosus: Secondary | ICD-10-CM | POA: Insufficient documentation

## 2019-03-27 DIAGNOSIS — D175 Benign lipomatous neoplasm of intra-abdominal organs: Secondary | ICD-10-CM

## 2019-03-27 DIAGNOSIS — K621 Rectal polyp: Secondary | ICD-10-CM | POA: Diagnosis not present

## 2019-03-27 DIAGNOSIS — D123 Benign neoplasm of transverse colon: Secondary | ICD-10-CM

## 2019-03-27 DIAGNOSIS — K5140 Inflammatory polyps of colon without complications: Secondary | ICD-10-CM | POA: Insufficient documentation

## 2019-03-27 DIAGNOSIS — Z800 Family history of malignant neoplasm of digestive organs: Secondary | ICD-10-CM | POA: Diagnosis not present

## 2019-03-27 DIAGNOSIS — F419 Anxiety disorder, unspecified: Secondary | ICD-10-CM | POA: Diagnosis not present

## 2019-03-27 HISTORY — PX: COLONOSCOPY WITH PROPOFOL: SHX5780

## 2019-03-27 SURGERY — COLONOSCOPY WITH PROPOFOL
Anesthesia: General

## 2019-03-27 MED ORDER — GLYCOPYRROLATE 0.2 MG/ML IJ SOLN
INTRAMUSCULAR | Status: DC | PRN
  Administered 2019-03-27: 0.1 mg via INTRAVENOUS

## 2019-03-27 MED ORDER — PROPOFOL 10 MG/ML IV BOLUS
INTRAVENOUS | Status: DC | PRN
  Administered 2019-03-27: 30 mg via INTRAVENOUS
  Administered 2019-03-27: 20 mg via INTRAVENOUS
  Administered 2019-03-27: 50 mg via INTRAVENOUS

## 2019-03-27 MED ORDER — LIDOCAINE HCL (PF) 2 % IJ SOLN
INTRAMUSCULAR | Status: AC
  Filled 2019-03-27: qty 10

## 2019-03-27 MED ORDER — GLYCOPYRROLATE 0.2 MG/ML IJ SOLN
INTRAMUSCULAR | Status: AC
  Filled 2019-03-27: qty 1

## 2019-03-27 MED ORDER — PHENYLEPHRINE HCL (PRESSORS) 10 MG/ML IV SOLN
INTRAVENOUS | Status: DC | PRN
  Administered 2019-03-27 (×2): 100 ug via INTRAVENOUS
  Administered 2019-03-27: 50 ug via INTRAVENOUS
  Administered 2019-03-27 (×2): 100 ug via INTRAVENOUS
  Administered 2019-03-27: 50 ug via INTRAVENOUS

## 2019-03-27 MED ORDER — PROPOFOL 500 MG/50ML IV EMUL
INTRAVENOUS | Status: AC
  Filled 2019-03-27: qty 50

## 2019-03-27 MED ORDER — FENTANYL CITRATE (PF) 100 MCG/2ML IJ SOLN
INTRAMUSCULAR | Status: DC | PRN
  Administered 2019-03-27 (×4): 25 ug via INTRAVENOUS

## 2019-03-27 MED ORDER — ONDANSETRON HCL 4 MG/2ML IJ SOLN
INTRAMUSCULAR | Status: AC
  Filled 2019-03-27: qty 2

## 2019-03-27 MED ORDER — SODIUM CHLORIDE 0.9 % IV SOLN
INTRAVENOUS | Status: DC | PRN
  Administered 2019-03-27: 11:00:00 via INTRAVENOUS

## 2019-03-27 MED ORDER — PROPOFOL 10 MG/ML IV BOLUS
INTRAVENOUS | Status: AC
  Filled 2019-03-27: qty 20

## 2019-03-27 MED ORDER — PROPOFOL 500 MG/50ML IV EMUL
INTRAVENOUS | Status: DC | PRN
  Administered 2019-03-27: 125 ug/kg/min via INTRAVENOUS

## 2019-03-27 MED ORDER — ONDANSETRON HCL 4 MG/2ML IJ SOLN
INTRAMUSCULAR | Status: DC | PRN
  Administered 2019-03-27: 4 mg via INTRAVENOUS

## 2019-03-27 MED ORDER — FENTANYL CITRATE (PF) 100 MCG/2ML IJ SOLN
INTRAMUSCULAR | Status: AC
  Filled 2019-03-27: qty 2

## 2019-03-27 MED ORDER — EPHEDRINE SULFATE 50 MG/ML IJ SOLN
INTRAMUSCULAR | Status: DC | PRN
  Administered 2019-03-27: 5 mg via INTRAVENOUS

## 2019-03-27 NOTE — Transfer of Care (Signed)
Immediate Anesthesia Transfer of Care Note  Patient: Elaine Melendez  Procedure(s) Performed: COLONOSCOPY WITH PROPOFOL (N/A )  Patient Location: PACU and Endoscopy Unit  Anesthesia Type:General  Level of Consciousness: awake  Airway & Oxygen Therapy: Patient Spontanous Breathing and Patient connected to nasal cannula oxygen  Post-op Assessment: Report given to RN and Post -op Vital signs reviewed and stable  Post vital signs: stable  Last Vitals:  Vitals Value Taken Time  BP 134/68 03/27/19 1132  Temp 36.4 C 03/27/19 1132  Pulse 81 03/27/19 1132  Resp 26 03/27/19 1132  SpO2 100 % 03/27/19 1132  Vitals shown include unvalidated device data.  Last Pain:  Vitals:   03/27/19 1132  TempSrc: Tympanic  PainSc: 0-No pain         Complications: No apparent anesthesia complications

## 2019-03-27 NOTE — Anesthesia Preprocedure Evaluation (Signed)
Anesthesia Evaluation  Patient identified by MRN, date of birth, ID band Patient awake    Reviewed: Allergy & Precautions, NPO status , Patient's Chart, lab work & pertinent test results  History of Anesthesia Complications Negative for: history of anesthetic complications  Airway Mallampati: II  TM Distance: >3 FB Neck ROM: Full    Dental no notable dental hx.    Pulmonary neg pulmonary ROS, neg sleep apnea, neg COPD,    breath sounds clear to auscultation- rhonchi (-) wheezing      Cardiovascular (-) hypertension(-) CAD, (-) Past MI, (-) Cardiac Stents and (-) CABG Dysrhythmias: hx of tachycardia.  Rhythm:Regular Rate:Normal - Systolic murmurs and - Diastolic murmurs    Neuro/Psych  Headaches, neg Seizures PSYCHIATRIC DISORDERS Anxiety Depression    GI/Hepatic Neg liver ROS, GERD  ,  Endo/Other  negative endocrine ROSneg diabetes  Renal/GU negative Renal ROS     Musculoskeletal negative musculoskeletal ROS (+)   Abdominal (+) + obese,   Peds  Hematology negative hematology ROS (+)   Anesthesia Other Findings Past Medical History: No date: Anxiety 05/10/2017: Cannot sleep 05/25/2017: Dense breast tissue No date: Dysrhythmia     Comment:  remote hs tachycardia>15 yrs ago, no meds No date: GERD (gastroesophageal reflux disease) No date: Headache 01/31/2007: Hypercholesteremia 12/05/2017: Joint effusion 06/21/2017: Left breast mass No date: Meniere's disease 12/05/2017: Polyarthralgia   Reproductive/Obstetrics                             Anesthesia Physical Anesthesia Plan  ASA: II  Anesthesia Plan: General   Post-op Pain Management:    Induction: Intravenous  PONV Risk Score and Plan: 2 and Propofol infusion  Airway Management Planned: Natural Airway  Additional Equipment:   Intra-op Plan:   Post-operative Plan:   Informed Consent: I have reviewed the patients History and  Physical, chart, labs and discussed the procedure including the risks, benefits and alternatives for the proposed anesthesia with the patient or authorized representative who has indicated his/her understanding and acceptance.     Dental advisory given  Plan Discussed with: CRNA and Anesthesiologist  Anesthesia Plan Comments:         Anesthesia Quick Evaluation

## 2019-03-27 NOTE — H&P (Signed)
Vonda Antigua, MD [address] [place], [place], [place], [address] [address], [place], [place], [place], [address] Phone: [phone]  Fax: [phone]  Primary Care Physician:  Virginia Crews, MD   Pre-Procedure History & Physical: HPI:  Elaine Melendez is a 63 y.o. female is here for a colonoscopy.   Past Medical History:  Diagnosis Date  . Anxiety   . Cannot sleep 05/10/2017  . Dense breast tissue 05/25/2017  . Dysrhythmia    remote hs tachycardia>15 yrs ago, no meds  . GERD (gastroesophageal reflux disease)   . Headache   . Hypercholesteremia 01/31/2007  . Joint effusion 12/05/2017  . Left breast mass 06/21/2017  . Meniere's disease   . Polyarthralgia 12/05/2017    Past Surgical History:  Procedure Laterality Date  . ANKLE FRACTURE SURGERY Right   . BREAST LUMPECTOMY    . CHOLECYSTECTOMY    . ENDOBRONCHIAL ULTRASOUND N/A 02/27/2018   Procedure: ENDOBRONCHIAL ULTRASOUND;  Surgeon: Laverle Hobby, MD;  Location: [facility];  Service: Pulmonary;  Laterality: N/A;  . FRACTURE SURGERY    . RADIOACTIVE SEED GUIDED EXCISIONAL BREAST BIOPSY Left 09/12/2017   Procedure: 2 RADIOACTIVE SEED GUIDED EXCISIONAL LEFT BREAST BIOPSY ERAS PATHWAY;  Surgeon: Stark Klein, MD;  Location: [facility];  Service: General;  Laterality: Left;    Prior to Admission medications   Medication Sig Start Date End Date Taking? Authorizing Provider  ibuprofen (ADVIL,MOTRIN) 200 MG tablet Take 400 mg by mouth daily as needed for moderate pain.    Yes [provider]  KLOR-CON M20 20 MEQ tablet TAKE 1 TABLET BY MOUTH EVERY DAY 07/24/18  Yes Bacigalupo, Dionne Bucy, MD  nystatin (MYCOSTATIN/NYSTOP) powder Apply topically 3 (three) times daily. 02/19/19  Yes Bacigalupo, Dionne Bucy, MD  promethazine (PHENERGAN) 25 MG tablet Take 25 mg by mouth every 6 (six) hours as needed for nausea or vomiting.    Yes [provider]  venlafaxine (EFFEXOR) 75 MG tablet TAKE 1  TABLET BY MOUTH EVERY DAY 02/28/19  Yes Bacigalupo, Dionne Bucy, MD  naratriptan (AMERGE) 2.5 MG tablet Take 2.5 mg by mouth every 4 (four) hours as needed for migraine.  12/27/17   [provider]  Pseudoeph-Doxylamine-DM-APAP (NYQUIL PO) Take 1 Dose by mouth at bedtime as needed (cough).    [provider]    Allergies as of 02/21/2019 - Review Complete 02/19/2019  Allergen Reaction Noted  . Nickel Rash 01/31/2007    Family History  Problem Relation Age of Onset  . Breast cancer Paternal Aunt   . Breast cancer Maternal Grandmother     Social History   Socioeconomic History  . Marital status: Married    Spouse name: Not on file  . Number of children: Not on file  . Years of education: Not on file  . Highest education level: Not on file  Occupational History  . Not on file  Social Needs  . Financial resource strain: Not on file  . Food insecurity    Worry: Not on file    Inability: Not on file  . Transportation needs    Medical: Not on file    Non-medical: Not on file  Tobacco Use  . Smoking status: Never Smoker  . Smokeless tobacco: Never Used  Substance and Sexual Activity  . Alcohol use: No    Alcohol/week: 0.0 standard drinks  . Drug use: Never  . Sexual activity: Not on file  Lifestyle  . Physical activity    Days  per week: Not on file    Minutes per session: Not on file  . Stress: Not on file  Relationships  . Social Herbalist on phone: Not on file    Gets together: Not on file    Attends religious service: Not on file    Active member of club or organization: Not on file    Attends meetings of clubs or organizations: Not on file    Relationship status: Not on file  . Intimate partner violence    Fear of current or ex partner: Not on file    Emotionally abused: Not on file    Physically abused: Not on file    Forced sexual activity: Not on file  Other Topics Concern  . Not on file  Social History Narrative  . Not on file     Review of Systems: See HPI, otherwise negative ROS  Physical Exam: BP (!) 176/86   Pulse (!) 110   Temp (!) 97.1 F (36.2 C) (Tympanic)   Resp 18   Ht 5\' 4"  (1.626 m)   Wt 90.7 kg   LMP  (LMP Unknown)   SpO2 98%   BMI 34.33 kg/m  General:   Alert,  pleasant and cooperative in NAD Head:  Normocephalic and atraumatic. Neck:  Supple; no masses or thyromegaly. Lungs:  Clear throughout to auscultation, normal respiratory effort.    Heart:  +S1, +S2, Regular rate and rhythm, No edema. Abdomen:  Soft, nontender and nondistended. Normal bowel sounds, without guarding, and without rebound.   Neurologic:  Alert and  oriented x4;  grossly normal neurologically.  Impression/Plan: Elaine Melendez is here for a colonoscopy to be performed for high risk screening, family history of colon cancer, personal history of colon polyps.  Risks, benefits, limitations, and alternatives regarding  colonoscopy have been reviewed with the patient.  Questions have been answered.  All parties agreeable.   Virgel Manifold, MD  03/27/2019, 9:02 AM

## 2019-03-27 NOTE — Anesthesia Postprocedure Evaluation (Signed)
Anesthesia Post Note  Patient: Elaine Melendez  Procedure(s) Performed: COLONOSCOPY WITH PROPOFOL (N/A )  Patient location during evaluation: Endoscopy Anesthesia Type: General Level of consciousness: awake and alert and oriented Pain management: pain level controlled Vital Signs Assessment: post-procedure vital signs reviewed and stable Respiratory status: spontaneous breathing, nonlabored ventilation and respiratory function stable Cardiovascular status: blood pressure returned to baseline and stable Postop Assessment: no signs of nausea or vomiting Anesthetic complications: no     Last Vitals:  Vitals:   03/27/19 1152 03/27/19 1155  BP: (!) 108/59   Pulse: 71 71  Resp: 13 16  Temp:    SpO2: 99% 99%    Last Pain:  Vitals:   03/27/19 1152  TempSrc:   PainSc: 0-No pain                 Abriana Saltos

## 2019-03-27 NOTE — Op Note (Signed)
[facility] Patient Name: Elaine Melendez Procedure Date: 03/27/2019 10:35 AM MRN: [id number] Account #: [id number] Date of Birth: 1956-04-04 Admit Type: Inpatient Age: 63 Room: [facility] ENDO ROOM 2 Gender: Female Note Status: Finalized Procedure:            Colonoscopy Indications:          Screening in patient at increased risk: Family history                        of 1st-degree relative with colorectal cancer Providers:             B. Bonna Gains MD, MD Referring MD:         Dionne Bucy. Bacigalupo (Referring MD) Medicines:            Monitored Anesthesia Care Complications:        No immediate complications. Procedure:            Pre-Anesthesia Assessment:                       - ASA Grade Assessment: II - A patient with mild                        systemic disease.                       - Prior to the procedure, a History and Physical was                        performed, and patient medications, allergies and                        sensitivities were reviewed. The patient's tolerance of                        previous anesthesia was reviewed.                       - The risks and benefits of the procedure and the                        sedation options and risks were discussed with the                        patient. All questions were answered and informed                        consent was obtained.                       - Patient identification and proposed procedure were                        verified prior to the procedure by the physician, the                        nurse, the anesthesiologist, the anesthetist and the                        technician. The procedure was verified in the procedure  room.                       After obtaining informed consent, the colonoscope was                        passed under direct vision. Throughout the procedure,                        the patient's blood pressure, pulse, and  oxygen                        saturations were monitored continuously. The                        Colonoscope was introduced through the anus and                        advanced to the the cecum, identified by appendiceal                        orifice and ileocecal valve. The colonoscopy was                        performed with ease. The patient tolerated the                        procedure well. The quality of the bowel preparation                        was good. Findings:      The perianal and digital rectal examinations were normal.      A 5 mm polyp was found in the transverse colon. The polyp was sessile.       The polyp was removed with a cold snare. Resection and retrieval were       complete. To stop active bleeding, one hemostatic clip was successfully       placed. There was no bleeding at the end of the procedure.      There was a medium-sized lipoma, in the descending colon. Biopsies were       taken with a cold forceps for histology.      Four sessile polyps were found in the rectum, sigmoid colon and       descending colon. The polyps were 3 to 4 mm in size. These polyps were       removed with a cold biopsy forceps. Resection and retrieval were       complete.      The exam was otherwise without abnormality.      The rectum, sigmoid colon, descending colon, transverse colon, ascending       colon and cecum appeared normal.      The retroflexed view of the distal rectum and anal verge was normal and       showed no anal or rectal abnormalities. Impression:           - One 5 mm polyp in the transverse colon, removed with                        a cold snare. Resected and retrieved. Clip was placed.                       -  Medium-sized lipoma in the descending colon. Biopsied.                       - Four 3 to 4 mm polyps in the rectum, in the sigmoid                        colon and in the descending colon, removed with a cold                        biopsy forceps.  Resected and retrieved.                       - The examination was otherwise normal.                       - The rectum, sigmoid colon, descending colon,                        transverse colon, ascending colon and cecum are normal.                       - The distal rectum and anal verge are normal on                        retroflexion view. Recommendation:       - Discharge patient to home (with escort).                       - Advance diet as tolerated.                       - Continue present medications.                       - Await pathology results.                       - Repeat colonoscopy in 5 years.                       - The findings and recommendations were discussed with                        the patient.                       - The findings and recommendations were discussed with                        the patient's family.                       - Return to primary care physician as previously                        scheduled. Procedure Code(s):    --- Professional ---                       [phone], Colonoscopy, flexible; with removal of tumor(s),                        polyp(s), or other lesion(s) by  snare technique                       45380, 59, Colonoscopy, flexible; with biopsy, single                        or multiple Diagnosis Code(s):    --- Professional ---                       Z80.0, Family history of malignant neoplasm of                        digestive organs                       K63.5, Polyp of colon                       D17.5, Benign lipomatous neoplasm of intra-abdominal                        organs                       K62.1, Rectal polyp CPT copyright 2019 American Medical Association. All rights reserved. The codes documented in this report are preliminary and upon coder review may  be revised to meet current compliance requirements.  Vonda Antigua, MD Margretta Sidle B. Bonna Gains MD, MD 03/27/2019 11:35:16 AM This report has been signed  electronically. Number of Addenda: 0 Note Initiated On: 03/27/2019 10:35 AM Scope Withdrawal Time: 0 hours 34 minutes 10 seconds  Total Procedure Duration: 0 hours 40 minutes 54 seconds  Estimated Blood Loss: Estimated blood loss: none.      [facility]

## 2019-03-27 NOTE — Anesthesia Post-op Follow-up Note (Signed)
Anesthesia QCDR form completed.        

## 2019-03-28 ENCOUNTER — Encounter: Payer: Self-pay | Admitting: Gastroenterology

## 2019-03-28 LAB — SURGICAL PATHOLOGY

## 2019-04-02 ENCOUNTER — Telehealth: Payer: Self-pay | Admitting: Gastroenterology

## 2019-04-02 NOTE — Telephone Encounter (Signed)
Pt left vm she states she had a Colonoscopy on 03/27/19 and saw a referral from [place] to a Dr. Fredirick Maudlin on her Mychart  and she is wanting to know why and is there something she saw on the procedure she should be concerned about please call pt

## 2019-04-03 NOTE — Telephone Encounter (Signed)
Pt advised of recent colonoscopy results. Pt advised the appt with Dr. Fredirick Maudlin was a 1 year thyroid follow up.

## 2019-04-08 ENCOUNTER — Ambulatory Visit
Admission: RE | Admit: 2019-04-08 | Discharge: 2019-04-08 | Disposition: A | Discharge status: Home / Self Care | Payer: Medicare Other | Source: Ambulatory Visit | Attending: Family Medicine | Admitting: Family Medicine

## 2019-04-08 DIAGNOSIS — Z1239 Encounter for other screening for malignant neoplasm of breast: Secondary | ICD-10-CM | POA: Diagnosis present

## 2019-04-08 DIAGNOSIS — Z9010 Acquired absence of unspecified breast and nipple: Secondary | ICD-10-CM | POA: Diagnosis not present

## 2019-04-08 DIAGNOSIS — Z1231 Encounter for screening mammogram for malignant neoplasm of breast: Secondary | ICD-10-CM | POA: Insufficient documentation

## 2019-04-11 NOTE — Progress Notes (Signed)
Patient: Elaine Melendez Female    DOB: 1956/01/15   62 y.o.   MRN: [id number] Visit Date: 04/12/2019  Today's Provider: Lavon Paganini, MD   Chief Complaint  Patient presents with  . Shoulder Pain   Subjective:    I, Elaine Melendez CMA, am acting as a scribe for Lavon Paganini, MD.   Shoulder Pain  The pain is present in the right shoulder. The current episode started 1 to 4 weeks ago. The problem occurs constantly. The quality of the pain is described as aching. The pain is at a severity of 8/10. The pain is moderate. Associated symptoms include joint swelling and a limited range of motion. Pertinent negatives include no numbness, stiffness or tingling. The symptoms are aggravated by activity. She has tried NSAIDS for the symptoms. The treatment provided mild relief.   Pain started 3-4 weeks ago More pain in last week Hurts to lay on that side and lift arm over head or behind back Feels swollen No injury/trauma. No numbness/weakness Never had anything like this before  Allergies  Allergen Reactions  . Nickel Rash     Current Outpatient Medications:  .  ibuprofen (ADVIL,MOTRIN) 200 MG tablet, Take 400 mg by mouth daily as needed for moderate pain. , Disp: , Rfl:  .  KLOR-CON M20 20 MEQ tablet, TAKE 1 TABLET BY MOUTH EVERY DAY, Disp: 90 tablet, Rfl: 1 .  naratriptan (AMERGE) 2.5 MG tablet, Take 2.5 mg by mouth every 4 (four) hours as needed for migraine. , Disp: , Rfl: 1 .  nystatin (MYCOSTATIN/NYSTOP) powder, Apply topically 3 (three) times daily., Disp: 30 g, Rfl: 3 .  promethazine (PHENERGAN) 25 MG tablet, Take 25 mg by mouth every 6 (six) hours as needed for nausea or vomiting. , Disp: , Rfl:  .  Pseudoeph-Doxylamine-DM-APAP (NYQUIL PO), Take 1 Dose by mouth at bedtime as needed (cough)., Disp: , Rfl:  .  venlafaxine (EFFEXOR) 75 MG tablet, TAKE 1 TABLET BY MOUTH EVERY DAY, Disp: 90 tablet, Rfl: 1  Review of Systems  Constitutional: Negative.   Respiratory:  Negative.   Cardiovascular: Negative.   Musculoskeletal: Negative.  Negative for stiffness.       Right shoulder pain   Neurological: Negative for tingling and numbness.  Hematological: Negative.     Social History   Tobacco Use  . Smoking status: Never Smoker  . Smokeless tobacco: Never Used  Substance Use Topics  . Alcohol use: No    Alcohol/week: 0.0 standard drinks      Objective:   BP 132/77 (BP Location: Left Arm, Patient Position: Sitting, Cuff Size: Normal)   Pulse 89   Temp (!) 96.8 F (36 C) (Temporal)   Wt 209 lb 9.6 oz (95.1 kg)   LMP  (LMP Unknown)   SpO2 97%   BMI 35.98 kg/m  Vitals:   04/12/19 0945  BP: 132/77  Pulse: 89  Temp: (!) 96.8 F (36 C)  TempSrc: Temporal  SpO2: 97%  Weight: 209 lb 9.6 oz (95.1 kg)     Physical Exam Vitals signs reviewed.  Constitutional:      General: She is not in acute distress.    Appearance: She is well-developed.  HENT:     Head: Normocephalic and atraumatic.  Eyes:     General: No scleral icterus.    Conjunctiva/sclera: Conjunctivae normal.  Cardiovascular:     Rate and Rhythm: Normal rate and regular rhythm.     Pulses: Normal pulses.  Heart sounds: Normal heart sounds. No murmur.  Pulmonary:     Effort: Pulmonary effort is normal. No respiratory distress.     Breath sounds: Normal breath sounds. No wheezing.  Musculoskeletal:     Comments: R Shoulder: Inspection reveals no abnormalities, atrophy or asymmetry. Mild swelling noted over anterior shoulder No tenderness over AC joint, minimal TTP over anterior shoulder ROM is limited in internal rotation, abduction and flexion due to pain Rotator cuff strength normal throughout. +Hawkin's tests  Skin:    General: Skin is warm and dry.     Capillary Refill: Capillary refill takes less than 2 seconds.     Findings: No rash.  Neurological:     Mental Status: She is alert and oriented to person, place, and time. Mental status is at baseline.   Psychiatric:        Behavior: Behavior normal.      No results found for any visits on 04/12/19.     Assessment & Plan   1. Acute bursitis of right shoulder - new problem - no evidence of rotator cuff pathology - exam and history are consistent with subacromial bursitis - does have some difficulty with ROM, but seems related to pain and no frozen shoulder - discussed RICE - will go ahead with steroid injection today  - discussed HEP and return precautions   Meds ordered this encounter  Medications  . lidocaine (PF) (XYLOCAINE) 1 % injection 4 mL  . methylPREDNISolone acetate (DEPO-MEDROL) injection 40 mg    INJECTION: Patient was given informed consent,. Appropriate time out was taken. Area prepped and draped in usual sterile fashion. 1 cc of depo-medrol 40 mg/ml plus  4 cc of 1% lidocaine without epinephrine was injected into the R shoulder subacromial space using a(n) posterior approach. The patient tolerated the procedure well. There were no complications. Post procedure instructions were given.    Return in about 4 weeks (around 05/10/2019) for 2nd shingrix and flu vaccine.   The entirety of the information documented in the History of Present Illness, Review of Systems and Physical Exam were personally obtained by me. Portions of this information were initially documented by [facility], CMA and reviewed by me for thoroughness and accuracy.    Bacigalupo, Dionne Bucy, MD MPH [facility]

## 2019-04-12 ENCOUNTER — Encounter: Payer: Self-pay | Admitting: Family Medicine

## 2019-04-12 ENCOUNTER — Ambulatory Visit (INDEPENDENT_AMBULATORY_CARE_PROVIDER_SITE_OTHER): Payer: Medicare Other | Admitting: Family Medicine

## 2019-04-12 ENCOUNTER — Other Ambulatory Visit: Payer: Self-pay

## 2019-04-12 VITALS — BP 132/77 | HR 89 | Temp 96.8°F | Wt 209.6 lb

## 2019-04-12 DIAGNOSIS — M7551 Bursitis of right shoulder: Secondary | ICD-10-CM

## 2019-04-12 MED ORDER — METHYLPREDNISOLONE ACETATE 40 MG/ML IJ SUSP
40.0000 mg | Freq: Once | INTRAMUSCULAR | Status: AC
  Administered 2019-04-12: 40 mg via INTRAMUSCULAR

## 2019-04-12 MED ORDER — LIDOCAINE HCL (PF) 1 % IJ SOLN
4.0000 mL | Freq: Once | INTRAMUSCULAR | Status: AC
  Administered 2019-04-12: 4 mL

## 2019-04-18 ENCOUNTER — Telehealth: Payer: Self-pay | Admitting: Family Medicine

## 2019-04-18 MED ORDER — MELOXICAM 15 MG PO TABS: 15.0000 mg | ORAL_TABLET | Freq: Every day | ORAL | 1 refills | Status: DC

## 2019-04-18 NOTE — Telephone Encounter (Signed)
Patient advised. She agrees to start Mobic. [facility].

## 2019-04-18 NOTE — Telephone Encounter (Signed)
Cortisone can take a while to kick in.  Up to 7-10 days.  Could start an antiinflammatory like Mobic daily to help as well.  I can send Rx if she agrees. If this doesn't help, recommend referral to Ortho

## 2019-04-18 NOTE — Telephone Encounter (Signed)
Pt got a shot of Cortizone last Friday in her arm for pain.  She said her arm is still hurting,  Please advise  CB#  [phone]  Elaine Melendez

## 2019-04-18 NOTE — Telephone Encounter (Signed)
rx sent

## 2019-04-29 ENCOUNTER — Ambulatory Visit (INDEPENDENT_AMBULATORY_CARE_PROVIDER_SITE_OTHER): Payer: Medicare Other | Admitting: Family Medicine

## 2019-04-29 ENCOUNTER — Encounter: Payer: Self-pay | Admitting: Family Medicine

## 2019-04-29 ENCOUNTER — Other Ambulatory Visit: Payer: Self-pay

## 2019-04-29 DIAGNOSIS — Z23 Encounter for immunization: Secondary | ICD-10-CM

## 2019-04-29 NOTE — Progress Notes (Signed)
Patient here for Flu and Shingrix vaccination only.  I did not examine the patient.  I did review his medical history, medications, and allergies and vaccine consent form.  CMA gave vaccination. Patient tolerated well.  Virginia Crews, MD, MPH [facility] 04/29/2019 11:47 AM

## 2019-05-07 ENCOUNTER — Ambulatory Visit: Payer: BLUE CROSS/BLUE SHIELD | Admitting: General Surgery

## 2019-06-21 ENCOUNTER — Other Ambulatory Visit: Payer: Self-pay | Admitting: Family Medicine

## 2019-06-21 MED ORDER — POTASSIUM CHLORIDE CRYS ER 20 MEQ PO TBCR: 20.0000 meq | EXTENDED_RELEASE_TABLET | Freq: Every day | ORAL | 1 refills | Status: DC

## 2019-06-21 NOTE — Telephone Encounter (Signed)
Please advise. Thank you

## 2019-06-21 NOTE — Telephone Encounter (Signed)
Pt called requesting refill of Klor-Con M20 [facility] to [facility] [address] also wanting to have something called in for her shoulder instead of Meloxicam.  States it makes her sick and she is not able to take.

## 2019-07-05 ENCOUNTER — Other Ambulatory Visit: Payer: Self-pay

## 2019-07-05 ENCOUNTER — Encounter: Payer: Self-pay | Admitting: Family Medicine

## 2019-07-05 ENCOUNTER — Ambulatory Visit (INDEPENDENT_AMBULATORY_CARE_PROVIDER_SITE_OTHER): Payer: Medicare Other | Admitting: Family Medicine

## 2019-07-05 VITALS — BP 127/73 | HR 77 | Temp 97.1°F | Resp 16 | Ht 64.0 in | Wt 216.2 lb

## 2019-07-05 DIAGNOSIS — M7501 Adhesive capsulitis of right shoulder: Secondary | ICD-10-CM

## 2019-07-05 NOTE — Patient Instructions (Signed)
Adhesive Capsulitis  Adhesive capsulitis, also called frozen shoulder, causes the shoulder to become stiff and painful to move. This condition happens when there is inflammation of the tendons and ligaments that surround the shoulder joint (shoulder capsule). What are the causes? This condition may be caused by:  An injury to your shoulder joint.  Straining your shoulder.  Not moving your shoulder for a period of time. This can happen if your arm was injured or in a sling.  Long-standing conditions, such as: ? Diabetes. ? Thyroid problems. ? Heart disease. ? Stroke. ? Rheumatoid arthritis. ? Lung disease. In some cases, the cause is not known. What increases the risk? You are more likely to develop this condition if you are:  A woman.  Older than 65 years of age. What are the signs or symptoms? Symptoms of this condition include:  Pain in your shoulder when you move your arm. There may also be pain when parts of your shoulder are touched. The pain may be worse at night or when you are resting.  A sore or aching shoulder.  The inability to move your shoulder normally.  Muscle spasms. How is this diagnosed? This condition is diagnosed with a physical exam and imaging tests, such as an X-ray or MRI. How is this treated? This condition may be treated with:  Treatment of the underlying cause or condition.  Medicine. Medicine may be given to relieve pain, inflammation, or muscle spasms.  Steroid injections into the shoulder joint.  Physical therapy. This involves performing exercises to get the shoulder moving again.  Acupuncture. This is a type of treatment that involves stimulating specific points on your body by inserting thin needles through your skin.  Shoulder manipulation. This is a procedure to move the shoulder into another position. It is done after you are given a medicine to make you fall asleep (general anesthetic). The joint may also be injected with salt  water at high pressure to break down scarring.  Surgery. This may be done in severe cases when other treatments have failed. Although most people recover completely from adhesive capsulitis, some may not regain full shoulder movement. Follow these instructions at home: Managing pain, stiffness, and swelling      If directed, put ice on the injured area: ? Put ice in a plastic bag. ? Place a towel between your skin and the bag. ? Leave the ice on for 20 minutes, 2-3 times per day.  If directed, apply heat to the affected area before you exercise. Use the heat source that your health care provider recommends, such as a moist heat pack or a heating pad. ? Place a towel between your skin and the heat source. ? Leave the heat on for 20-30 minutes. ? Remove the heat if your skin turns bright red. This is especially important if you are unable to feel pain, heat, or cold. You may have a greater risk of getting burned. General instructions  Take over-the-counter and prescription medicines only as told by your health care provider.  If you are being treated with physical therapy, follow instructions from your physical therapist.  Avoid exercises that put a lot of demand on your shoulder, such as throwing. These exercises can make pain worse.  Keep all follow-up visits as told by your health care provider. This is important. Contact a health care provider if:  You develop new symptoms.  Your symptoms get worse. Summary  Adhesive capsulitis, also called frozen shoulder, causes the shoulder to become   stiff and painful to move.  You are more likely to have this condition if you are a woman and over age 40.  It is treated with physical therapy, medicines, and sometimes surgery. This information is not intended to replace advice given to you by your health care provider. Make sure you discuss any questions you have with your health care provider. Document Released: 05/29/2009 Document  Revised: 01/05/2018 Document Reviewed: 01/05/2018 Elsevier Patient Education  2020 [employer]  

## 2019-07-05 NOTE — Progress Notes (Signed)
Patient: Elaine Melendez Female    DOB: 08/20/1955   63 y.o.   MRN: [id number] Visit Date: 07/05/2019  Today's Provider: Lavon Paganini, MD   Chief Complaint  Patient presents with  . Shoulder Pain   Subjective:     HPI Patient here today C/O persistent right shoulder pain. Patient reports that she did try taking Meloxicam, reports nausea, dizziness and feeling sick. Patient reports injection of depo-medrol did not help any with pain.  She has noticed that it is getting harder to fasten her close reach behind her back as well as reach overhead.  She has never had anything like this before.  She worries that her Lofgren syndrome could be back.   Allergies  Allergen Reactions  . Nickel Rash     Current Outpatient Medications:  .  ibuprofen (ADVIL,MOTRIN) 200 MG tablet, Take 400 mg by mouth daily as needed for moderate pain. , Disp: , Rfl:  .  naratriptan (AMERGE) 2.5 MG tablet, Take 2.5 mg by mouth every 4 (four) hours as needed for migraine. , Disp: , Rfl: 1 .  nystatin (MYCOSTATIN/NYSTOP) powder, Apply topically 3 (three) times daily., Disp: 30 g, Rfl: 3 .  potassium chloride SA (KLOR-CON M20) 20 MEQ tablet, Take 1 tablet (20 mEq total) by mouth daily., Disp: 90 tablet, Rfl: 1 .  promethazine (PHENERGAN) 25 MG tablet, Take 25 mg by mouth every 6 (six) hours as needed for nausea or vomiting. , Disp: , Rfl:  .  venlafaxine (EFFEXOR) 75 MG tablet, TAKE 1 TABLET BY MOUTH EVERY DAY, Disp: 90 tablet, Rfl: 1 .  meloxicam (MOBIC) 15 MG tablet, Take 1 tablet (15 mg total) by mouth daily. (Patient not taking: Reported on 07/05/2019), Disp: 30 tablet, Rfl: 1  Review of Systems  Constitutional: Negative.   Respiratory: Negative.   Cardiovascular: Negative.   Musculoskeletal: Positive for arthralgias and myalgias.  Neurological: Negative.   Psychiatric/Behavioral: Negative.     Social History   Tobacco Use  . Smoking status: Never Smoker  . Smokeless tobacco: Never Used   Substance Use Topics  . Alcohol use: No    Alcohol/week: 0.0 standard drinks      Objective:   BP 127/73 (BP Location: Left Arm, Patient Position: Sitting, Cuff Size: Large)   Pulse 77   Temp (!) 97.1 F (36.2 C) (Temporal)   Resp 16   Ht 5\' 4"  (1.626 m)   Wt 216 lb 3.2 oz (98.1 kg)   LMP  (LMP Unknown)   BMI 37.11 kg/m  Vitals:   07/05/19 1117  BP: 127/73  Pulse: 77  Resp: 16  Temp: (!) 97.1 F (36.2 C)  TempSrc: Temporal  Weight: 216 lb 3.2 oz (98.1 kg)  Height: 5\' 4"  (1.626 m)  Body mass index is 37.11 kg/m.   Physical Exam Vitals signs reviewed.  Constitutional:      General: She is not in acute distress.    Appearance: Normal appearance. She is well-developed. She is not diaphoretic.  HENT:     Head: Normocephalic and atraumatic.  Eyes:     General: No scleral icterus.    Conjunctiva/sclera: Conjunctivae normal.  Neck:     Musculoskeletal: Neck supple.     Thyroid: No thyromegaly.  Cardiovascular:     Rate and Rhythm: Normal rate and regular rhythm.     Pulses: Normal pulses.     Heart sounds: Normal heart sounds. No murmur.  Pulmonary:     Effort: Pulmonary  effort is normal. No respiratory distress.     Breath sounds: Normal breath sounds. No wheezing, rhonchi or rales.  Musculoskeletal:     Right lower leg: No edema.     Left lower leg: No edema.     Comments: R Shoulder: Inspection reveals no abnormalities, atrophy or asymmetry. Palpation is normal with no tenderness over AC joint or bicipital groove. ROM significantly limited in abduction and flexion  rotator cuff strength normal throughout.  Lymphadenopathy:     Cervical: No cervical adenopathy.  Skin:    General: Skin is warm and dry.     Capillary Refill: Capillary refill takes less than 2 seconds.     Findings: No rash.  Neurological:     Mental Status: She is alert and oriented to person, place, and time. Mental status is at baseline.  Psychiatric:        Mood and Affect: Mood normal.         Behavior: Behavior normal.      No results found for any visits on 07/05/19.     Assessment & Plan   1. Adhesive capsulitis of right shoulder -New problem -Suspect that what appeared to be bursitis at her last visit was early frozen shoulder that has now progressed -Given her inability to tolerate meloxicam and other NSAIDs, can use Tylenol as needed -Advised ice -Advised gentle range of motion exercises -Referral to orthopedics for further evaluation and management -May also benefit from formal physical therapy -Discussed return precautions - Ambulatory referral to Orthopedic Surgery   Return if symptoms worsen or fail to improve.   The entirety of the information documented in the History of Present Illness, Review of Systems and Physical Exam were personally obtained by me. Portions of this information were initially documented by Lynford Humphrey, CMA and reviewed by me for thoroughness and accuracy.    , Dionne Bucy, MD MPH [facility]

## 2019-07-15 ENCOUNTER — Telehealth: Payer: Self-pay

## 2019-07-15 NOTE — Telephone Encounter (Signed)
Copied from [place] [id number]. Topic: Referral - Status >> Jul 15, 2019 12:30 PM Scherrie Gerlach wrote: Reason for CRM: pt following up on referral for orthopedic. Pt would like appt asap, due to pain.  It does not appear referral has been sent to them.

## 2019-07-16 NOTE — Telephone Encounter (Signed)
Please review  Thank you

## 2019-09-04 DIAGNOSIS — M19019 Primary osteoarthritis, unspecified shoulder: Secondary | ICD-10-CM | POA: Insufficient documentation

## 2019-09-13 ENCOUNTER — Other Ambulatory Visit: Payer: Self-pay

## 2019-09-13 ENCOUNTER — Ambulatory Visit (INDEPENDENT_AMBULATORY_CARE_PROVIDER_SITE_OTHER): Payer: Medicare Other | Admitting: Family Medicine

## 2019-09-13 ENCOUNTER — Encounter: Payer: Self-pay | Admitting: Family Medicine

## 2019-09-13 VITALS — BP 126/76 | HR 89 | Temp 97.3°F | Wt 214.0 lb

## 2019-09-13 DIAGNOSIS — Z01818 Encounter for other preprocedural examination: Secondary | ICD-10-CM

## 2019-09-13 DIAGNOSIS — R739 Hyperglycemia, unspecified: Secondary | ICD-10-CM

## 2019-09-13 LAB — POCT URINALYSIS DIPSTICK
Bilirubin, UA: NEGATIVE
Blood, UA: NEGATIVE
Glucose, UA: NEGATIVE
Ketones, UA: NEGATIVE
Leukocytes, UA: NEGATIVE
Nitrite, UA: NEGATIVE
Protein, UA: NEGATIVE
Spec Grav, UA: 1.025 (ref 1.010–1.025)
Urobilinogen, UA: 0.2 E.U./dL
pH, UA: 6.5 (ref 5.0–8.0)

## 2019-09-13 NOTE — Progress Notes (Signed)
Patient: Elaine Melendez Female    DOB: [birth year]   64 y.o.   MRN: [id number] Visit Date: 09/13/2019  Today's Provider: Lavon Paganini, MD   Chief Complaint  Patient presents with  . Medical Clearance   Subjective:     HPI Pt is a 64 y.o. female who is here for preoperative clearance for R total shoulder arthroplasty  [facility] - Dr Harlow Mares   1) High Risk Cardiac Conditions  1) Recent MI - No.  2) Decompensated Heart Failure - No.  3) Unstable angina - No.  4) Symptomatic arrythmia - No.  5) Sx Valvular Disease - No.  2) Intermediate Risk Factors - DM, CKD, CVA, CHF, CAD - No.  2) Functional Status - > 4 mets (Walk, run, climb stairs) Yes.  Rob Hickman Activity Status Index: 50.7 (8.97 METS)  3) Surgery Specific Risk - Intermediate (Carotid, Head and Neck, Orthopaedic )  4) Further Noninvasive evaluation -   1) EKG - Yes.   - required by Ortho  2) Echo - No.   1) Worsening dyspnea   3) Stress Testing - Active Cardiac Disease - No.  5) Need for medical therapy - Beta Blocker, Statins indicated ? No.   Allergies  Allergen Reactions  . Nickel Rash     Current Outpatient Medications:  .  ibuprofen (ADVIL,MOTRIN) 200 MG tablet, Take 400 mg by mouth daily as needed for moderate pain. , Disp: , Rfl:  .  nystatin (MYCOSTATIN/NYSTOP) powder, Apply topically 3 (three) times daily., Disp: 30 g, Rfl: 3 .  potassium chloride SA (KLOR-CON M20) 20 MEQ tablet, Take 1 tablet (20 mEq total) by mouth daily., Disp: 90 tablet, Rfl: 1 .  promethazine (PHENERGAN) 25 MG tablet, Take 25 mg by mouth every 6 (six) hours as needed for nausea or vomiting. , Disp: , Rfl:  .  venlafaxine (EFFEXOR) 75 MG tablet, TAKE 1 TABLET BY MOUTH EVERY DAY, Disp: 90 tablet, Rfl: 1 .  meloxicam (MOBIC) 15 MG tablet, Take 1 tablet (15 mg total) by mouth daily. (Patient not taking: Reported on 07/05/2019), Disp: 30 tablet, Rfl: 1 .  naratriptan (AMERGE) 2.5 MG tablet, Take 2.5 mg by mouth every 4  (four) hours as needed for migraine. , Disp: , Rfl: 1  Review of Systems  Constitutional: Negative.   Respiratory: Negative.   Cardiovascular: Negative.   Gastrointestinal: Negative.   Musculoskeletal: Positive for arthralgias. Negative for back pain, gait problem, joint swelling, myalgias, neck pain and neck stiffness.  Neurological: Negative for dizziness, light-headedness and headaches.    Social History   Tobacco Use  . Smoking status: Never Smoker  . Smokeless tobacco: Never Used  Substance Use Topics  . Alcohol use: No    Alcohol/week: 0.0 standard drinks      Objective:   BP 126/76 (BP Location: Left Arm, Patient Position: Sitting, Cuff Size: Large)   Pulse 89   Temp (!) 97.3 F (36.3 C) (Temporal)   Wt 214 lb (97.1 kg)   LMP  (LMP Unknown)   SpO2 97%   BMI 36.73 kg/m  Vitals:   09/13/19 0957  BP: 126/76  Pulse: 89  Temp: (!) 97.3 F (36.3 C)  TempSrc: Temporal  SpO2: 97%  Weight: 214 lb (97.1 kg)  Body mass index is 36.73 kg/m.   Physical Exam Vitals reviewed.  Constitutional:      General: She is not in acute distress.    Appearance: Normal appearance. She is well-developed.  She is not diaphoretic.  HENT:     Head: Normocephalic and atraumatic.  Eyes:     General: No scleral icterus.    Conjunctiva/sclera: Conjunctivae normal.  Neck:     Thyroid: No thyromegaly.  Cardiovascular:     Rate and Rhythm: Normal rate and regular rhythm.     Heart sounds: Normal heart sounds. No murmur.  Pulmonary:     Effort: Pulmonary effort is normal. No respiratory distress.     Breath sounds: Normal breath sounds. No wheezing or rales.  Abdominal:     General: There is no distension.     Palpations: Abdomen is soft.     Tenderness: There is no abdominal tenderness.  Musculoskeletal:        General: No deformity.     Cervical back: Neck supple.     Right lower leg: No edema.     Left lower leg: No edema.  Lymphadenopathy:     Cervical: No cervical  adenopathy.  Skin:    General: Skin is warm and dry.     Capillary Refill: Capillary refill takes less than 2 seconds.     Findings: No rash.  Neurological:     Mental Status: She is alert and oriented to person, place, and time.  Psychiatric:        Mood and Affect: Mood normal.        Behavior: Behavior normal.        Thought Content: Thought content normal.      No results found for any visits on 09/13/19.     Assessment & Plan    1. Preoperative clearance I have independently evaluated patient.  Diane KEMIAH ROLF is a 65 y.o. female who is low risk for a intermediate risk surgery.  There are not modifiable risk factors (smoking, etc). Diane O Waguespack's RCRI/NSQIP calculation for MACE is: 1.4% (below average).    - EKG benign, no signs of current or previous ischemic process - CXR, Echo, stress testing, PFTs not indicated - hold NSAIDs for 4 days prior to surgery - will obtain CBC, BMP, albumin, UA, A1c as requested by Surgeon  Follow-up as needed  The entirety of the information documented in the History of Present Illness, Review of Systems and Physical Exam were personally obtained by me. Portions of this information were initially documented by Ashley Royalty, CMA and reviewed by me for thoroughness and accuracy.    Nariah Morgano, Dionne Bucy, MD MPH [facility]

## 2019-09-13 NOTE — Addendum Note (Signed)
Addended by: Ashley Royalty E on: 09/13/2019 11:04 AM   Modules accepted: Orders

## 2019-09-14 LAB — CBC
Hematocrit: 45 % (ref 34.0–46.6)
Hemoglobin: 15.3 g/dL (ref 11.1–15.9)
MCH: 30.8 pg (ref 26.6–33.0)
MCHC: 34 g/dL (ref 31.5–35.7)
MCV: 91 fL (ref 79–97)
Platelets: 280 10*3/uL (ref 150–450)
RBC: 4.97 x10E6/uL (ref 3.77–5.28)
RDW: 11.9 % (ref 11.7–15.4)
WBC: 13.9 10*3/uL — ABNORMAL HIGH (ref 3.4–10.8)

## 2019-09-14 LAB — ALBUMIN: Albumin: 4.6 g/dL (ref 3.8–4.8)

## 2019-09-14 LAB — HEMOGLOBIN A1C
Est. average glucose Bld gHb Est-mCnc: 105 mg/dL
Hgb A1c MFr Bld: 5.3 % (ref 4.8–5.6)

## 2019-09-14 LAB — BASIC METABOLIC PANEL
BUN/Creatinine Ratio: 16 (ref 12–28)
BUN: 10 mg/dL (ref 8–27)
CO2: 20 mmol/L (ref 20–29)
Calcium: 9.7 mg/dL (ref 8.7–10.3)
Chloride: 105 mmol/L (ref 96–106)
Creatinine, Ser: 0.63 mg/dL (ref 0.57–1.00)
GFR calc Af Amer: 109 mL/min/{1.73_m2} (ref >59)
GFR calc non Af Amer: 94 mL/min/{1.73_m2} (ref >59)
Glucose: 88 mg/dL (ref 65–99)
Potassium: 3.9 mmol/L (ref 3.5–5.2)
Sodium: 143 mmol/L (ref 134–144)

## 2019-09-16 ENCOUNTER — Telehealth: Payer: Self-pay

## 2019-09-16 NOTE — Telephone Encounter (Signed)
-----   Message from Virginia Crews, MD sent at 09/16/2019 11:28 AM EST ----- Normal labs, except WBC count is slightly elevated. This could be related to any recent infection.  This will not affect clearance for surgery (we can send these with the completed form to [facility]).  We can recheck at next visit.

## 2019-09-16 NOTE — Telephone Encounter (Signed)
   Result Notes and Comments to Patient Comment seen by patient [name] on 09/16/2019 11:42 AM EST

## 2019-10-07 DIAGNOSIS — M25561 Pain in right knee: Secondary | ICD-10-CM | POA: Insufficient documentation

## 2019-11-21 DIAGNOSIS — R600 Localized edema: Secondary | ICD-10-CM | POA: Insufficient documentation

## 2019-12-11 ENCOUNTER — Other Ambulatory Visit: Payer: Self-pay | Admitting: Family Medicine

## 2019-12-11 MED ORDER — VENLAFAXINE HCL 75 MG PO TABS: 75.0000 mg | ORAL_TABLET | Freq: Every day | ORAL | 1 refills | Status: DC

## 2019-12-11 NOTE — Telephone Encounter (Signed)
Medication Refill - Medication: effexor  Has the patient contacted their pharmacy? Yes.   (Agent: If no, request that the patient contact the pharmacy for the refill.) (Agent: If yes, when and what did the pharmacy advise?)  Preferred Pharmacy (with phone number or street name):  [id number] [address], [place]  [place] [address]  Phone: [phone] Fax: [phone]  Not a 24 hour pharmacy; exact hours not known.     Agent: Please be advised that RX refills may take up to 3 business days. We ask that you follow-up with your pharmacy.

## 2019-12-11 NOTE — Telephone Encounter (Signed)
Requested Prescriptions  Pending Prescriptions Disp Refills  . venlafaxine (EFFEXOR) 75 MG tablet 90 tablet 1    Sig: Take 1 tablet (75 mg total) by mouth daily.     Psychiatry: Antidepressants - SNRI - desvenlafaxine & venlafaxine Failed - 12/11/2019 11:17 AM      Failed - LDL in normal range and within 360 days    LDL Calculated  Date Value Ref Range Status  02/19/2019 120 (H) 0 - 99 mg/dL Final         Failed - Total Cholesterol in normal range and within 360 days    Cholesterol, Total  Date Value Ref Range Status  02/19/2019 204 (H) 100 - 199 mg/dL Final         Passed - Triglycerides in normal range and within 360 days    Triglycerides  Date Value Ref Range Status  02/19/2019 98 0 - 149 mg/dL Final         Passed - Completed PHQ-2 or PHQ-9 in the last 360 days.      Passed - Last BP in normal range    BP Readings from Last 1 Encounters:  09/13/19 126/76         Passed - Valid encounter within last 6 months    Recent Outpatient Visits          2 months ago Preoperative clearance   [facility] [place], Dionne Bucy, MD   5 months ago Adhesive capsulitis of right shoulder   [facility] [place], Dionne Bucy, MD   7 months ago Need for influenza vaccination   [facility], Dionne Bucy, MD   8 months ago Acute bursitis of right shoulder   [facility] [place], Dionne Bucy, MD   9 months ago Encounter for annual physical exam   [facility] [place], Dionne Bucy, MD

## 2019-12-13 ENCOUNTER — Other Ambulatory Visit: Payer: Self-pay | Admitting: Family Medicine

## 2020-01-27 ENCOUNTER — Telehealth: Payer: Self-pay

## 2020-01-27 NOTE — Telephone Encounter (Signed)
Patient scheduled for appointment.

## 2020-01-27 NOTE — Telephone Encounter (Signed)
Copied from [place] [id number]. Topic: General - Other >> Jan 27, 2020  1:33 PM Wynetta Emery, [place] C wrote: Reason for CRM: pt called in to schedule an ov for fall. Pt says that she was clinically advised to see her PCP just to be sure of no concussion. Pt says that she feels fine. She had a headache for 2 days days ago. Pt says fall happened on Friday. PCP is out of the office, pt would like to know if another provider could see her sooner than later?   Please advise.

## 2020-01-28 ENCOUNTER — Encounter: Payer: Self-pay | Admitting: Family Medicine

## 2020-01-28 ENCOUNTER — Other Ambulatory Visit: Payer: Self-pay

## 2020-01-28 ENCOUNTER — Ambulatory Visit (INDEPENDENT_AMBULATORY_CARE_PROVIDER_SITE_OTHER): Payer: Medicare Other | Admitting: Family Medicine

## 2020-01-28 VITALS — BP 146/82 | HR 83 | Temp 97.5°F | Resp 18 | Ht 64.0 in | Wt 213.0 lb

## 2020-01-28 DIAGNOSIS — W19XXXA Unspecified fall, initial encounter: Secondary | ICD-10-CM

## 2020-01-28 DIAGNOSIS — F32A Depression, unspecified: Secondary | ICD-10-CM

## 2020-01-28 DIAGNOSIS — F329 Major depressive disorder, single episode, unspecified: Secondary | ICD-10-CM

## 2020-01-28 DIAGNOSIS — H81319 Aural vertigo, unspecified ear: Secondary | ICD-10-CM

## 2020-01-28 DIAGNOSIS — F419 Anxiety disorder, unspecified: Secondary | ICD-10-CM | POA: Diagnosis not present

## 2020-01-28 DIAGNOSIS — S060X0A Concussion without loss of consciousness, initial encounter: Secondary | ICD-10-CM | POA: Diagnosis not present

## 2020-01-28 NOTE — Progress Notes (Signed)
Established patient visit  I,April Miller,acting as a scribe for Wilhemena Durie, MD.,have documented all relevant documentation on the behalf of Wilhemena Durie, MD,as directed by  Wilhemena Durie, MD while in the presence of Wilhemena Durie, MD.   Patient: Elaine Melendez   DOB: 12-16-1955   63 y.o. Female  MRN: [id number] Visit Date: 01/28/2020  Today's healthcare provider: Wilhemena Durie, MD   Chief Complaint  Patient presents with  . Fall   Subjective    Fall The accident occurred 3 to 5 days ago (4 nights ago). The fall occurred while walking. She fell from a height of 3 to 5 ft. She landed on hard floor. There was no blood loss. The point of impact was the head and left hip. The pain is present in the head. The pain is at a severity of 4/10. The pain is moderate. The symptoms are aggravated by pressure on injury. Associated symptoms include bowel incontinence and nausea. Pertinent negatives include no abdominal pain, fever, headaches, hearing loss, hematuria, loss of consciousness, numbness, tingling, visual change or vomiting. She has tried ice for the symptoms. The treatment provided mild relief.   Patient tripped over a stool in the bathroom 4 nights ago and hit the back of her head on the bathtub. Patient states states she had a headache for 2 days after fall, but that has resolved now. Patient states she has had some nausea for the past 2 days, however patient states it is normal for her to have nausea due to Mnire's Diease. Patient states there is a lump on left side of the back of her head. Patient used ice on the lump. There was no bleeding.  She has had no neurologic symptoms recently.  She only struck and injured the back of her head.  Her neck is been fine.  No syncope or presyncope.     Medications: Outpatient Medications Prior to Visit  Medication Sig  . ibuprofen (ADVIL,MOTRIN) 200 MG tablet Take 400 mg by mouth daily as needed for moderate pain.     Marland Kitchen KLOR-CON M20 20 MEQ tablet TAKE 1 TABLET BY MOUTH EVERY DAY  . naratriptan (AMERGE) 2.5 MG tablet Take 2.5 mg by mouth every 4 (four) hours as needed for migraine.   . nystatin (MYCOSTATIN/NYSTOP) powder Apply topically 3 (three) times daily.  Marland Kitchen venlafaxine (EFFEXOR) 75 MG tablet Take 1 tablet (75 mg total) by mouth daily.  . promethazine (PHENERGAN) 25 MG tablet Take 25 mg by mouth every 6 (six) hours as needed for nausea or vomiting.  (Patient not taking: Reported on 01/28/2020)   No facility-administered medications prior to visit.    Review of Systems  Constitutional: Negative for appetite change, chills, fatigue and fever.  Eyes: Negative.   Respiratory: Negative for chest tightness and shortness of breath.   Cardiovascular: Negative for chest pain and palpitations.  Gastrointestinal: Positive for bowel incontinence and nausea. Negative for abdominal pain and vomiting.  Endocrine: Negative.   Genitourinary: Negative for hematuria.  Musculoskeletal: Negative.   Allergic/Immunologic: Negative.   Neurological: Negative for dizziness, tingling, loss of consciousness, weakness, numbness and headaches.  Hematological: Negative.   Psychiatric/Behavioral: Negative.        Objective    LMP  (LMP Unknown)     Physical Exam Vitals and nursing note reviewed.  Constitutional:      Appearance: Normal appearance. She is normal weight.  HENT:     Head: Normocephalic.  Comments: Mildly tender small hematoma at the occiput.  No laceration or break in the skin.    Right Ear: Tympanic membrane normal.     Left Ear: Tympanic membrane normal.     Nose: Nose normal.     Mouth/Throat:     Mouth: Mucous membranes are moist.     Pharynx: Oropharynx is clear.  Eyes:     Conjunctiva/sclera: Conjunctivae normal.  Cardiovascular:     Rate and Rhythm: Normal rate and regular rhythm.     Pulses: Normal pulses.     Heart sounds: Normal heart sounds.  Pulmonary:     Effort: Pulmonary  effort is normal.     Breath sounds: Normal breath sounds.  Abdominal:     General: Bowel sounds are normal.     Palpations: Abdomen is soft.  Musculoskeletal:     Cervical back: Normal range of motion and neck supple.     Comments: C-spine completely nontender  Skin:    General: Skin is warm and dry.  Neurological:     General: No focal deficit present.     Mental Status: She is alert and oriented to person, place, and time.     Cranial Nerves: No cranial nerve deficit.     Motor: No weakness.     Coordination: Coordination normal.     Gait: Gait normal.  Psychiatric:        Mood and Affect: Mood normal.        Behavior: Behavior normal.        Thought Content: Thought content normal.        Judgment: Judgment normal.       No results found for any visits on 01/28/20.  Assessment & Plan     1. Concussion without loss of consciousness, initial encounter I think she is recovering well.  I do not think she needs any imaging.  She had headaches for a couple of days and those of completely resolved.  I do not think she needs x-rays of her C-spine either.  2. Accident due to mechanical fall without injury, initial encounter She tripped over her granddaughter's stool.  We had a discussion about falls and just being aware of her surroundings.  3. Auditory vertigo, unspecified laterality History of Mnire's long-term.  She has had no problems recently.  4. Anxiety and depression Clinically patient appears very stable today.   No follow-ups on file.      I, Wilhemena Durie, MD, have reviewed all documentation for this visit. The documentation on 01/29/20 for the exam, diagnosis, procedures, and orders are all accurate and complete.    Ambera Fedele Cranford Mon, MD  [facility] [phone] (phone) [phone] (fax)  [place]

## 2020-02-25 ENCOUNTER — Encounter: Payer: Self-pay | Admitting: Family Medicine

## 2020-03-24 ENCOUNTER — Ambulatory Visit: Payer: Medicare Other

## 2020-03-31 NOTE — Progress Notes (Signed)
Subjective:   Elaine Melendez is a 64 y.o. female who presents for an Initial Medicare Annual Wellness Visit.  I connected with Elaine Melendez today by telephone and verified that I am speaking with the correct person using two identifiers. Location patient: home Location provider: work Persons participating in the virtual visit: patient, provider.   I discussed the limitations, risks, security and privacy concerns of performing an evaluation and management service by telephone and the availability of in person appointments. I also discussed with the patient that there may be a patient responsible charge related to this service. The patient expressed understanding and verbally consented to this telephonic visit.    Interactive audio and video telecommunications were attempted between this provider and patient, however failed, due to patient having technical difficulties OR patient did not have access to video capability.  We continued and completed visit with audio only.   Review of Systems    N/A  Cardiac Risk Factors include: advanced age (>42men, >67 women);hypertension;obesity (BMI >30kg/m2)     Objective:    There were no vitals filed for this visit. There is no height or weight on file to calculate BMI.  Advanced Directives 04/01/2020 03/27/2019 02/27/2018 02/22/2018 09/12/2017 09/06/2017  Does Patient Have a Medical Advance Directive? Yes Yes Yes Yes Yes Yes  Type of Paramedic of [place];Living will - Healthcare Power of [place];Living will Living will Living will  Does patient want to make changes to medical advance directive? - - No - Patient declined No - Patient declined No - Patient declined -  Copy of [place] in Chart? No - copy requested - No - copy requested No - copy requested - -  Would patient like information on creating a medical advance directive? - - No - Patient declined No - Patient declined - -     Current Medications (verified) Outpatient Encounter Medications as of 04/01/2020  Medication Sig  . bisacodyl (DULCOLAX) 5 MG EC tablet Take 5 mg by mouth daily as needed for moderate constipation.  . dimenhyDRINATE (DRAMAMINE) 50 MG tablet Take 50 mg by mouth every 8 (eight) hours as needed.  Marland Kitchen ibuprofen (ADVIL,MOTRIN) 200 MG tablet Take 400 mg by mouth daily as needed for moderate pain.   Marland Kitchen KLOR-CON M20 20 MEQ tablet TAKE 1 TABLET BY MOUTH EVERY DAY  . naratriptan (AMERGE) 2.5 MG tablet Take 2.5 mg by mouth every 4 (four) hours as needed for migraine.   . nystatin (MYCOSTATIN/NYSTOP) powder Apply topically 3 (three) times daily.  Marland Kitchen venlafaxine (EFFEXOR) 75 MG tablet Take 1 tablet (75 mg total) by mouth daily.  . promethazine (PHENERGAN) 25 MG tablet Take 25 mg by mouth every 6 (six) hours as needed for nausea or vomiting.  (Patient not taking: Reported on 01/28/2020)   No facility-administered encounter medications on file as of 04/01/2020.    Allergies (verified) Nickel   History: Past Medical History:  Diagnosis Date  . Anxiety   . Cannot sleep 05/10/2017  . Dense breast tissue 05/25/2017  . Dysrhythmia    remote hs tachycardia>15 yrs ago, no meds  . GERD (gastroesophageal reflux disease)   . Headache   . Hypercholesteremia 01/31/2007  . Joint effusion 12/05/2017  . Left breast mass 06/21/2017  . Meniere's disease   . Polyarthralgia 12/05/2017   Past Surgical History:  Procedure Laterality Date  . ANKLE FRACTURE SURGERY Right   . BREAST EXCISIONAL BIOPSY Left 09/11/2018   neg  high risk lesion  . BREAST LUMPECTOMY    . CHOLECYSTECTOMY    . COLONOSCOPY WITH PROPOFOL N/A 03/27/2019   Procedure: COLONOSCOPY WITH PROPOFOL;  Surgeon: Virgel Manifold, MD;  Location: [facility] ENDOSCOPY;  Service: Gastroenterology;  Laterality: N/A;  . ENDOBRONCHIAL ULTRASOUND N/A 02/27/2018   Procedure: ENDOBRONCHIAL ULTRASOUND;  Surgeon: Laverle Hobby, MD;  Location: [facility] ORS;  Service:  Pulmonary;  Laterality: N/A;  . FRACTURE SURGERY    . RADIOACTIVE SEED GUIDED EXCISIONAL BREAST BIOPSY Left 09/12/2017   Procedure: 2 RADIOACTIVE SEED GUIDED EXCISIONAL LEFT BREAST BIOPSY ERAS PATHWAY;  Surgeon: Stark Klein, MD;  Location: [facility];  Service: General;  Laterality: Left;  . TOTAL SHOULDER REPLACEMENT     Family History  Problem Relation Age of Onset  . Breast cancer Paternal Aunt   . Breast cancer Maternal Grandmother   . Alzheimer's disease Mother   . Colon polyps Mother        cancerous; partial colon removal  . Squamous cell carcinoma Father   . Hypertension Father    Social History   Socioeconomic History  . Marital status: Married    Spouse name: Not on file  . Number of children: 2  . Years of education: Not on file  . Highest education level: Associate degree: occupational, Hotel manager, or vocational program  Occupational History  . Occupation: retired  Tobacco Use  . Smoking status: Never Smoker  . Smokeless tobacco: Never Used  Vaping Use  . Vaping Use: Never used  Substance and Sexual Activity  . Alcohol use: No    Alcohol/week: 0.0 standard drinks  . Drug use: Never  . Sexual activity: Not on file  Other Topics Concern  . Not on file  Social History Narrative  . Not on file   Social Determinants of Health   Financial Resource Strain: Low Risk   . Difficulty of Paying Living Expenses: Not hard at all  Food Insecurity: No Food Insecurity  . Worried About Charity fundraiser in the Last Year: Never true  . Ran Out of Food in the Last Year: Never true  Transportation Needs: No Transportation Needs  . Lack of Transportation (Medical): No  . Lack of Transportation (Non-Medical): No  Physical Activity: Insufficiently Active  . Days of Exercise per Week: 1 day  . Minutes of Exercise per Session: 60 min  Stress: No Stress Concern Present  . Feeling of Stress : Only a little  Social Connections: Moderately Isolated  . Frequency  of Communication with Friends and Family: More than three times a week  . Frequency of Social Gatherings with Friends and Family: More than three times a week  . Attends Religious Services: Never  . Active Member of Clubs or Organizations: No  . Attends Archivist Meetings: Never  . Marital Status: Married    Tobacco Counseling Counseling given: Not Answered   Clinical Intake:  Pre-visit preparation completed: Yes  Pain : No/denies pain     Nutritional Risks: Nausea/ vomitting/ diarrhea (Nausea occasionally due to Menieres Disease.) Diabetes: No  How often do you need to have someone help you when you read instructions, pamphlets, or other written materials from your doctor or pharmacy?: 1 - Never  Diabetic? No  Interpreter Needed?: No  Information entered by :: [facility], LPN   Activities of Daily Living In your present state of health, do you have any difficulty performing the following activities: 04/01/2020  Hearing? Y  Comment Has hearing loss in the  left ear due to Menieres Disease. Has a hearing aid for the left ear.  Vision? N  Difficulty concentrating or making decisions? N  Walking or climbing stairs? N  Dressing or bathing? N  Doing errands, shopping? N  Preparing Food and eating ? N  Using the Toilet? N  In the past six months, have you accidently leaked urine? N  Do you have problems with loss of bowel control? N  Managing your Medications? N  Managing your Finances? N  Housekeeping or managing your Housekeeping? N  Some recent data might be hidden    Patient Care Team: Brita Romp Dionne Bucy, MD as PCP - General (Family Medicine) Lorelee Cover., MD (Ophthalmology) Stark Klein, MD as Consulting Physician (General Surgery) Thornton Park, MD as Referring Physician (Orthopedic Surgery) Lovell Sheehan, MD as Consulting Physician (Orthopedic Surgery) Virgel Manifold, MD as Consulting Physician (Gastroenterology) Nestor Lewandowsky, MD  as Referring Physician (Cardiothoracic Surgery) Orie Rout, MD as Referring Physician (Specialist)  Indicate any recent Medical Services you may have received from other than [facility] providers in the past year (date may be approximate).     Assessment:   This is a routine wellness examination for Elaine.  Hearing/Vision screen No exam data present  Dietary issues and exercise activities discussed: Current Exercise Habits: Structured exercise class (PT), Type of exercise: strength training/weights;stretching, Time (Minutes): 60, Frequency (Times/Week): 1, Weekly Exercise (Minutes/Week): 60, Intensity: Mild, Exercise limited by: orthopedic condition(s)  Goals    . Exercise 3x per week (30 min per time)     Recommend to start walking 3 days a week for at least 30 minutes at a time.       Depression Screen PHQ 2/9 Scores 04/01/2020 07/05/2019 02/19/2019 04/24/2018 02/19/2018 04/04/2017  PHQ - 2 Score 0 0 0 0 4 0  PHQ- 9 Score - 2 0 3 14 -    Fall Risk Fall Risk  04/01/2020 02/19/2019 04/04/2017  Falls in the past year? 1 0 No  Number falls in past yr: 0 - -  Comment Tripped - -  Injury with Fall? 0 - -    Any stairs in or around the home? Yes  If so, are there any without handrails? No  Home free of loose throw rugs in walkways, pet beds, electrical cords, etc? Yes  Adequate lighting in your home to reduce risk of falls? Yes   ASSISTIVE DEVICES UTILIZED TO PREVENT FALLS:  Life alert? No  Use of a cane, walker or w/c? No  Grab bars in the bathroom? No  Shower chair or bench in shower? No  Elevated toilet seat or a handicapped toilet? No    Cognitive Function: Declined today.        Immunizations Immunization History  Administered Date(s) Administered  . Fluad Quad(high Dose 65+) 04/29/2019  . Influenza,inj,Quad PF,6+ Mos 06/26/2013, 04/24/2018  . Tdap 02/19/2019  . Zoster Recombinat (Shingrix) 02/19/2019, 04/29/2019    TDAP status: Up to date Flu Vaccine status: Up  to date Pneumococcal vaccine status: Declined,  Education has been provided regarding the importance of this vaccine but patient still declined. Advised may receive this vaccine at local pharmacy or Health Dept. Aware to provide a copy of the vaccination record if obtained from local pharmacy or Health Dept. Verbalized acceptance and understanding.  Covid-19 vaccine status: Declined, Education has been provided regarding the importance of this vaccine but patient still declined. Advised may receive this vaccine at local pharmacy or Health Dept.or vaccine clinic. Aware  to provide a copy of the vaccination record if obtained from local pharmacy or Health Dept. Verbalized acceptance and understanding.  Qualifies for Shingles Vaccine? Yes   Zostavax completed No   Shingrix Completed?: Yes  Screening Tests Health Maintenance  Topic Date Due  . DEXA SCAN  Never done  . PNA vac Low Risk Adult (1 of 2 - PCV13) Never done  . INFLUENZA VACCINE  03/15/2020  . COVID-19 Vaccine (1) 04/17/2020 (Originally 04/08/1968)  . MAMMOGRAM  04/07/2021  . PAP SMEAR-Modifier  02/19/2024  . COLONOSCOPY  03/26/2024  . TETANUS/TDAP  02/18/2029  . Hepatitis C Screening  Completed  . HIV Screening  Completed    Health Maintenance  Health Maintenance Due  Topic Date Due  . DEXA SCAN  Never done  . PNA vac Low Risk Adult (1 of 2 - PCV13) Never done  . INFLUENZA VACCINE  03/15/2020    Colorectal cancer screening: Completed 03/27/19. Repeat every 5 years Mammogram status: Completed 04/08/19. Repeat every year. Pt to check with Dr. Barry Dienes about a repeat mammogram. Bone Density status: Ordered today. Pt provided with contact info and advised to call to schedule appt.  Lung Cancer Screening: (Low Dose CT Chest recommended if Age 51-80 years, 30 pack-year currently smoking OR have quit w/in 15years.) does not qualify.   Additional Screening:  Hepatitis C Screening: Up to date  Vision Screening: Recommended annual  ophthalmology exams for early detection of glaucoma and other disorders of the eye. Is the patient up to date with their annual eye exam?  Yes  Who is the provider or what is the name of the office in which the patient attends annual eye exams? Dr Gloriann Loan If pt is not established with a provider, would they like to be referred to a provider to establish care? No .   Dental Screening: Recommended annual dental exams for proper oral hygiene  Community Resource Referral / Chronic Care Management: CRR required this visit?  No   CCM required this visit?  No      Plan:     I have personally reviewed and noted the following in the patient's chart:   . Medical and social history . Use of alcohol, tobacco or illicit drugs  . Current medications and supplements . Functional ability and status . Nutritional status . Physical activity . Advanced directives . List of other physicians . Hospitalizations, surgeries, and ER visits in previous 12 months . Vitals . Screenings to include cognitive, depression, and falls . Referrals and appointments  In addition, I have reviewed and discussed with patient certain preventive protocols, quality metrics, and best practice recommendations. A written personalized care plan for preventive services as well as general preventive health recommendations were provided to patient.     Cartier Washko [place], [place]   [id number]/[id number]   Nurse Notes: Pt would like to receive the Prevnar 13 vaccine at next in office apt. DEXA scan ordered today. Declined receiving a Covid vaccine at this time. Pt to follow up with Dr Barry Dienes about a repeat mammogram.

## 2020-04-01 ENCOUNTER — Other Ambulatory Visit: Payer: Self-pay

## 2020-04-01 ENCOUNTER — Ambulatory Visit (INDEPENDENT_AMBULATORY_CARE_PROVIDER_SITE_OTHER): Payer: Medicare Other

## 2020-04-01 DIAGNOSIS — Z0000 Encounter for general adult medical examination without abnormal findings: Secondary | ICD-10-CM

## 2020-04-01 DIAGNOSIS — E2839 Other primary ovarian failure: Secondary | ICD-10-CM

## 2020-04-01 NOTE — Patient Instructions (Signed)
[name] , Thank you for taking time to come for your Medicare Wellness Visit. I appreciate your ongoing commitment to your health goals. Please review the following plan we discussed and let me know if I can assist you in the future.   Screening recommendations/referrals: Colonoscopy: Up to date, due 03/2024 Mammogram: Up to date, due 04/07/20. Pt to check with Dr Barry Dienes about a repeat mammogram. Bone Density: Ordered today. Pt advised office will call to schedule appt. Recommended yearly ophthalmology/optometry visit for glaucoma screening and checkup Recommended yearly dental visit for hygiene and checkup  Vaccinations: Influenza vaccine: Due fall 2021 Pneumococcal vaccine: Currently due, pt to receive at next in office apt.  Tdap vaccine: Up to date, due 02/2029 Shingles vaccine: Completed series    Advanced directives: Please bring a copy of your POA (Power of Attorney) and/or Living Will to your next appointment.   Conditions/risks identified: Obesity- recommend to start walking 3 days a week for at least 30 minutes at a time.   Next appointment: 07/07/20 @ 10:40 AM with Dr Brita Romp. Declined scheduling a repeat AWV for next year, at this time.   Preventive Care 15 Years and Older, Female Preventive care refers to lifestyle choices and visits with your health care provider that can promote health and wellness. What does preventive care include?  A yearly physical exam. This is also called an annual well check.  Dental exams once or twice a year.  Routine eye exams. Ask your health care provider how often you should have your eyes checked.  Personal lifestyle choices, including:  Daily care of your teeth and gums.  Regular physical activity.  Eating a healthy diet.  Avoiding tobacco and drug use.  Limiting alcohol use.  Practicing safe sex.  Taking low-dose aspirin every day.  Taking vitamin and mineral supplements as recommended by your health care  provider. What happens during an annual well check? The services and screenings done by your health care provider during your annual well check will depend on your age, overall health, lifestyle risk factors, and family history of disease. Counseling  Your health care provider may ask you questions about your:  Alcohol use.  Tobacco use.  Drug use.  Emotional well-being.  Home and relationship well-being.  Sexual activity.  Eating habits.  History of falls.  Memory and ability to understand (cognition).  Work and work Statistician.  Reproductive health. Screening  You may have the following tests or measurements:  Height, weight, and BMI.  Blood pressure.  Lipid and cholesterol levels. These may be checked every 5 years, or more frequently if you are over 71 years old.  Skin check.  Lung cancer screening. You may have this screening every year starting at age 43 if you have a 30-pack-year history of smoking and currently smoke or have quit within the past 15 years.  Fecal occult blood test (FOBT) of the stool. You may have this test every year starting at age 59.  Flexible sigmoidoscopy or colonoscopy. You may have a sigmoidoscopy every 5 years or a colonoscopy every 10 years starting at age 70.  Hepatitis C blood test.  Hepatitis B blood test.  Sexually transmitted disease (STD) testing.  Diabetes screening. This is done by checking your blood sugar (glucose) after you have not eaten for a while (fasting). You may have this done every 1-3 years.  Bone density scan. This is done to screen for osteoporosis. You may have this done starting at age 83.  Mammogram. This  may be done every 1-2 years. Talk to your health care provider about how often you should have regular mammograms. Talk with your health care provider about your test results, treatment options, and if necessary, the need for more tests. Vaccines  Your health care provider may recommend certain  vaccines, such as:  Influenza vaccine. This is recommended every year.  Tetanus, diphtheria, and acellular pertussis (Tdap, Td) vaccine. You may need a Td booster every 10 years.  Zoster vaccine. You may need this after age 49.  Pneumococcal 13-valent conjugate (PCV13) vaccine. One dose is recommended after age 51.  Pneumococcal polysaccharide (PPSV23) vaccine. One dose is recommended after age 17. Talk to your health care provider about which screenings and vaccines you need and how often you need them. This information is not intended to replace advice given to you by your health care provider. Make sure you discuss any questions you have with your health care provider. Document Released: 08/28/2015 Document Revised: 04/20/2016 Document Reviewed: 06/02/2015 Elsevier Interactive Patient Education  2017 [employer] Prevention in the Home Falls can cause injuries. They can happen to people of all ages. There are many things you can do to make your home safe and to help prevent falls. What can I do on the outside of my home?  Regularly fix the edges of walkways and driveways and fix any cracks.  Remove anything that might make you trip as you walk through a door, such as a raised step or threshold.  Trim any bushes or trees on the path to your home.  Use bright outdoor lighting.  Clear any walking paths of anything that might make someone trip, such as rocks or tools.  Regularly check to see if handrails are loose or broken. Make sure that both sides of any steps have handrails.  Any raised decks and porches should have guardrails on the edges.  Have any leaves, snow, or ice cleared regularly.  Use sand or salt on walking paths during winter.  Clean up any spills in your garage right away. This includes oil or grease spills. What can I do in the bathroom?  Use night lights.  Install grab bars by the toilet and in the tub and shower. Do not use towel bars as grab  bars.  Use non-skid mats or decals in the tub or shower.  If you need to sit down in the shower, use a plastic, non-slip stool.  Keep the floor dry. Clean up any water that spills on the floor as soon as it happens.  Remove soap buildup in the tub or shower regularly.  Attach bath mats securely with double-sided non-slip rug tape.  Do not have throw rugs and other things on the floor that can make you trip. What can I do in the bedroom?  Use night lights.  Make sure that you have a light by your bed that is easy to reach.  Do not use any sheets or blankets that are too big for your bed. They should not hang down onto the floor.  Have a firm chair that has side arms. You can use this for support while you get dressed.  Do not have throw rugs and other things on the floor that can make you trip. What can I do in the kitchen?  Clean up any spills right away.  Avoid walking on wet floors.  Keep items that you use a lot in easy-to-reach places.  If you need to reach something above  you, use a strong step stool that has a grab bar.  Keep electrical cords out of the way.  Do not use floor polish or wax that makes floors slippery. If you must use wax, use non-skid floor wax.  Do not have throw rugs and other things on the floor that can make you trip. What can I do with my stairs?  Do not leave any items on the stairs.  Make sure that there are handrails on both sides of the stairs and use them. Fix handrails that are broken or loose. Make sure that handrails are as long as the stairways.  Check any carpeting to make sure that it is firmly attached to the stairs. Fix any carpet that is loose or worn.  Avoid having throw rugs at the top or bottom of the stairs. If you do have throw rugs, attach them to the floor with carpet tape.  Make sure that you have a light switch at the top of the stairs and the bottom of the stairs. If you do not have them, ask someone to add them for  you. What else can I do to help prevent falls?  Wear shoes that:  Do not have high heels.  Have rubber bottoms.  Are comfortable and fit you well.  Are closed at the toe. Do not wear sandals.  If you use a stepladder:  Make sure that it is fully opened. Do not climb a closed stepladder.  Make sure that both sides of the stepladder are locked into place.  Ask someone to hold it for you, if possible.  Clearly mark and make sure that you can see:  Any grab bars or handrails.  First and last steps.  Where the edge of each step is.  Use tools that help you move around (mobility aids) if they are needed. These include:  Canes.  Walkers.  Scooters.  Crutches.  Turn on the lights when you go into a dark area. Replace any light bulbs as soon as they burn out.  Set up your furniture so you have a clear path. Avoid moving your furniture around.  If any of your floors are uneven, fix them.  If there are any pets around you, be aware of where they are.  Review your medicines with your doctor. Some medicines can make you feel dizzy. This can increase your chance of falling. Ask your doctor what other things that you can do to help prevent falls. This information is not intended to replace advice given to you by your health care provider. Make sure you discuss any questions you have with your health care provider. Document Released: 05/28/2009 Document Revised: 01/07/2016 Document Reviewed: 09/05/2014 Elsevier Interactive Patient Education  2017 [employer].

## 2020-04-23 ENCOUNTER — Encounter: Payer: Self-pay | Admitting: Family Medicine

## 2020-04-24 ENCOUNTER — Telehealth: Payer: Self-pay

## 2020-04-24 NOTE — Telephone Encounter (Signed)
appt has been scheduled. KW

## 2020-04-24 NOTE — Telephone Encounter (Signed)
Copied from [place] [id number]. Topic: Appointment Scheduling - Scheduling Inquiry for Clinic >> Apr 24, 2020  1:18 PM Yvette Rack wrote: Reason for CRM: Pt stated she was told to call to schedule an appt for Covid vaccine in the office. Pt requests call back.

## 2020-04-29 ENCOUNTER — Ambulatory Visit (INDEPENDENT_AMBULATORY_CARE_PROVIDER_SITE_OTHER): Payer: Medicare Other

## 2020-04-29 ENCOUNTER — Other Ambulatory Visit: Payer: Self-pay

## 2020-04-29 DIAGNOSIS — Z23 Encounter for immunization: Secondary | ICD-10-CM

## 2020-05-01 ENCOUNTER — Ambulatory Visit: Payer: Self-pay

## 2020-05-01 NOTE — Telephone Encounter (Signed)
Pt. Reports she was with her granddaughter yesterday who was sick. Granddaughter tested positive today for COVID 19. Pt. Had her first [place] 19 vaccination this past Wednesday. Wants to know how long to wait to be tested. Instructed to wait 5 days. States she would like her PCP to know and give further advice.

## 2020-05-04 ENCOUNTER — Other Ambulatory Visit: Payer: Self-pay | Admitting: Unknown Physician Specialty

## 2020-05-04 ENCOUNTER — Telehealth: Payer: Self-pay | Admitting: Unknown Physician Specialty

## 2020-05-04 ENCOUNTER — Ambulatory Visit (HOSPITAL_COMMUNITY)
Admission: RE | Admit: 2020-05-04 | Discharge: 2020-05-04 | Disposition: A | Discharge status: Home / Self Care | Payer: Medicare Other | Source: Ambulatory Visit | Attending: Pulmonary Disease | Admitting: Pulmonary Disease

## 2020-05-04 DIAGNOSIS — U071 COVID-19: Secondary | ICD-10-CM

## 2020-05-04 DIAGNOSIS — E663 Overweight: Secondary | ICD-10-CM | POA: Diagnosis present

## 2020-05-04 DIAGNOSIS — Z23 Encounter for immunization: Secondary | ICD-10-CM | POA: Insufficient documentation

## 2020-05-04 MED ORDER — EPINEPHRINE 0.3 MG/0.3ML IJ SOAJ: 0.3000 mg | Freq: Once | INTRAMUSCULAR | Status: DC | PRN

## 2020-05-04 MED ORDER — METHYLPREDNISOLONE SODIUM SUCC 125 MG IJ SOLR: 125.0000 mg | Freq: Once | INTRAMUSCULAR | Status: DC | PRN

## 2020-05-04 MED ORDER — FAMOTIDINE IN NACL 20-0.9 MG/50ML-% IV SOLN: 20.0000 mg | Freq: Once | INTRAVENOUS | Status: DC | PRN

## 2020-05-04 MED ORDER — DIPHENHYDRAMINE HCL 50 MG/ML IJ SOLN: 50.0000 mg | Freq: Once | INTRAMUSCULAR | Status: DC | PRN

## 2020-05-04 MED ORDER — SODIUM CHLORIDE 0.9 % IV SOLN: INTRAVENOUS | Status: DC | PRN

## 2020-05-04 MED ORDER — ALBUTEROL SULFATE HFA 108 (90 BASE) MCG/ACT IN AERS: 2.0000 | INHALATION_SPRAY | Freq: Once | RESPIRATORY_TRACT | Status: DC | PRN

## 2020-05-04 MED ORDER — SODIUM CHLORIDE 0.9 % IV SOLN
1200.0000 mg | Freq: Once | INTRAVENOUS | Status: AC
  Administered 2020-05-04: 1200 mg via INTRAVENOUS

## 2020-05-04 NOTE — Telephone Encounter (Signed)
See [employer] message.  Referring for mAb as home test positive.

## 2020-05-04 NOTE — Discharge Instructions (Signed)

## 2020-05-04 NOTE — Telephone Encounter (Signed)
I connected by phone with Elaine Melendez on 05/04/2020 at 9:28 AM to discuss the potential use of a new treatment for mild to moderate COVID-19 viral infection in non-hospitalized patients.  This patient is a 64 y.o. female that meets the FDA criteria for Emergency Use Authorization of COVID monoclonal antibody casirivimab/imdevimab.  Has a (+) direct SARS-CoV-2 viral test result  Has mild or moderate COVID-19   Is NOT hospitalized due to COVID-19  Is within 10 days of symptom onset  Has at least one of the high risk factor(s) for progression to severe COVID-19 and/or hospitalization as defined in EUA.  Specific high risk criteria : Older age (>/= 66 yo)   I have spoken and communicated the following to the patient or parent/caregiver regarding COVID monoclonal antibody treatment:  1. FDA has authorized the emergency use for the treatment of mild to moderate COVID-19 in adults and pediatric patients with positive results of direct SARS-CoV-2 viral testing who are 21 years of age and older weighing at least 40 kg, and who are at high risk for progressing to severe COVID-19 and/or hospitalization.  2. The significant known and potential risks and benefits of COVID monoclonal antibody, and the extent to which such potential risks and benefits are unknown.  3. Information on available alternative treatments and the risks and benefits of those alternatives, including clinical trials.  4. Patients treated with COVID monoclonal antibody should continue to self-isolate and use infection control measures (e.g., wear mask, isolate, social distance, avoid sharing personal items, clean and disinfect "high touch" surfaces, and frequent handwashing) according to CDC guidelines.   5. The patient or parent/caregiver has the option to accept or refuse COVID monoclonal antibody treatment.  After reviewing this information with the patient, The patient agreed to proceed with receiving casirivimab\imdevimab  infusion and will be provided a copy of the Fact sheet prior to receiving the infusion. [name] 05/04/2020 9:28 AM  Sx onset 9/17

## 2020-05-04 NOTE — Progress Notes (Signed)
  Diagnosis: COVID-19  Physician: Dr. Asencion Noble  Procedure: [facility] Med: casirivimab\imdevimab infusion - Provided patient with casirivimab\imdevimab fact sheet for patients, parents and caregivers prior to infusion.  Complications: No immediate complications noted.  Discharge: Discharged home   [place] 05/04/2020

## 2020-05-07 MED ORDER — BENZONATATE 100 MG PO CAPS: 100.0000 mg | ORAL_CAPSULE | Freq: Two times a day (BID) | ORAL | 0 refills | Status: DC | PRN

## 2020-05-21 ENCOUNTER — Ambulatory Visit: Payer: Self-pay

## 2020-05-22 ENCOUNTER — Telehealth: Payer: Self-pay | Admitting: Family Medicine

## 2020-05-22 NOTE — Telephone Encounter (Signed)
Patient is calling to reschedule her 2nd vaccine 90 days after infusion. Advised patient that the schedule was not available out to December 20,2021 Please advise Cb- [phone]

## 2020-06-04 ENCOUNTER — Other Ambulatory Visit: Payer: Self-pay

## 2020-06-04 ENCOUNTER — Ambulatory Visit
Admission: RE | Admit: 2020-06-04 | Discharge: 2020-06-04 | Disposition: A | Discharge status: Home / Self Care | Payer: Medicare Other | Source: Ambulatory Visit | Attending: Family Medicine | Admitting: Family Medicine

## 2020-06-04 DIAGNOSIS — E2839 Other primary ovarian failure: Secondary | ICD-10-CM | POA: Diagnosis not present

## 2020-06-05 ENCOUNTER — Telehealth: Payer: Self-pay

## 2020-06-05 NOTE — Telephone Encounter (Signed)
-----   Message from Virginia Crews, MD sent at 06/04/2020  3:06 PM EDT ----- Bone density scan shows osteopenia (this is some bone loss, but not as bad as osteoporosis).  Recommend regular weight bearing exercise, avoiding smoking, and adequate Ca (1200mg /day) and Vit D (1000 units daily) via diet or supplement.  We will recheck in 2 years to ensure this hasn't worsened.

## 2020-06-05 NOTE — Telephone Encounter (Signed)
Pt advised.   Thanks,   -Ulyana Pitones  

## 2020-07-07 ENCOUNTER — Other Ambulatory Visit: Payer: Self-pay | Admitting: Family Medicine

## 2020-07-07 ENCOUNTER — Encounter: Payer: Medicare Other | Admitting: Family Medicine

## 2020-07-27 ENCOUNTER — Other Ambulatory Visit: Payer: Self-pay

## 2020-07-27 ENCOUNTER — Encounter: Payer: Self-pay | Admitting: Family Medicine

## 2020-07-27 ENCOUNTER — Ambulatory Visit (INDEPENDENT_AMBULATORY_CARE_PROVIDER_SITE_OTHER): Payer: Medicare Other | Admitting: Family Medicine

## 2020-07-27 ENCOUNTER — Ambulatory Visit
Admission: RE | Admit: 2020-07-27 | Discharge: 2020-07-27 | Disposition: A | Discharge status: Home / Self Care | Payer: Medicare Other | Source: Ambulatory Visit | Attending: Family Medicine | Admitting: Family Medicine

## 2020-07-27 VITALS — BP 136/82 | HR 72 | Temp 97.6°F | Resp 16 | Ht 64.0 in | Wt 215.0 lb

## 2020-07-27 DIAGNOSIS — E7800 Pure hypercholesterolemia, unspecified: Secondary | ICD-10-CM

## 2020-07-27 DIAGNOSIS — Z23 Encounter for immunization: Secondary | ICD-10-CM | POA: Diagnosis not present

## 2020-07-27 DIAGNOSIS — E041 Nontoxic single thyroid nodule: Secondary | ICD-10-CM | POA: Diagnosis not present

## 2020-07-27 DIAGNOSIS — F32A Depression, unspecified: Secondary | ICD-10-CM

## 2020-07-27 DIAGNOSIS — F419 Anxiety disorder, unspecified: Secondary | ICD-10-CM

## 2020-07-27 DIAGNOSIS — Z1231 Encounter for screening mammogram for malignant neoplasm of breast: Secondary | ICD-10-CM | POA: Diagnosis not present

## 2020-07-27 DIAGNOSIS — D72829 Elevated white blood cell count, unspecified: Secondary | ICD-10-CM

## 2020-07-27 NOTE — Assessment & Plan Note (Signed)
Reviewed last lipid panel Not currently on a statin Recheck FLP and CMP Discussed diet and exercise  

## 2020-07-27 NOTE — Patient Instructions (Signed)
Preventive Care 38 Years and Older, Female Preventive care refers to lifestyle choices and visits with your health care provider that can promote health and wellness. This includes:  A yearly physical exam. This is also called an annual well check.  Regular dental and eye exams.  Immunizations.  Screening for certain conditions.  Healthy lifestyle choices, such as diet and exercise. What can I expect for my preventive care visit? Physical exam Your health care provider will check:  Height and weight. These may be used to calculate body mass index (BMI), which is a measurement that tells if you are at a healthy weight.  Heart rate and blood pressure.  Your skin for abnormal spots. Counseling Your health care provider may ask you questions about:  Alcohol, tobacco, and drug use.  Emotional well-being.  Home and relationship well-being.  Sexual activity.  Eating habits.  History of falls.  Memory and ability to understand (cognition).  Work and work Statistician.  Pregnancy and menstrual history. What immunizations do I need?  Influenza (flu) vaccine  This is recommended every year. Tetanus, diphtheria, and pertussis (Tdap) vaccine  You may need a Td booster every 10 years. Varicella (chickenpox) vaccine  You may need this vaccine if you have not already been vaccinated. Zoster (shingles) vaccine  You may need this after age 33. Pneumococcal conjugate (PCV13) vaccine  One dose is recommended after age 33. Pneumococcal polysaccharide (PPSV23) vaccine  One dose is recommended after age 72. Measles, mumps, and rubella (MMR) vaccine  You may need at least one dose of MMR if you were born in 1957 or later. You may also need a second dose. Meningococcal conjugate (MenACWY) vaccine  You may need this if you have certain conditions. Hepatitis A vaccine  You may need this if you have certain conditions or if you travel or work in places where you may be exposed  to hepatitis A. Hepatitis B vaccine  You may need this if you have certain conditions or if you travel or work in places where you may be exposed to hepatitis B. Haemophilus influenzae type b (Hib) vaccine  You may need this if you have certain conditions. You may receive vaccines as individual doses or as more than one vaccine together in one shot (combination vaccines). Talk with your health care provider about the risks and benefits of combination vaccines. What tests do I need? Blood tests  Lipid and cholesterol levels. These may be checked every 5 years, or more frequently depending on your overall health.  Hepatitis C test.  Hepatitis B test. Screening  Lung cancer screening. You may have this screening every year starting at age 39 if you have a 30-pack-year history of smoking and currently smoke or have quit within the past 15 years.  Colorectal cancer screening. All adults should have this screening starting at age 36 and continuing until age 15. Your health care provider may recommend screening at age 23 if you are at increased risk. You will have tests every 1-10 years, depending on your results and the type of screening test.  Diabetes screening. This is done by checking your blood sugar (glucose) after you have not eaten for a while (fasting). You may have this done every 1-3 years.  Mammogram. This may be done every 1-2 years. Talk with your health care provider about how often you should have regular mammograms.  BRCA-related cancer screening. This may be done if you have a family history of breast, ovarian, tubal, or peritoneal cancers.  Other tests  Sexually transmitted disease (STD) testing.  Bone density scan. This is done to screen for osteoporosis. You may have this done starting at age 44. Follow these instructions at home: Eating and drinking  Eat a diet that includes fresh fruits and vegetables, whole grains, lean protein, and low-fat dairy products. Limit  your intake of foods with high amounts of sugar, saturated fats, and salt.  Take vitamin and mineral supplements as recommended by your health care provider.  Do not drink alcohol if your health care provider tells you not to drink.  If you drink alcohol: ? Limit how much you have to 0-1 drink a day. ? Be aware of how much alcohol is in your drink. In the [place] one drink equals one 12 oz bottle of beer (355 mL), one 5 oz glass of wine (148 mL), or one 1 oz glass of hard liquor (44 mL). Lifestyle  Take daily care of your teeth and gums.  Stay active. Exercise for at least 30 minutes on 5 or more days each week.  Do not use any products that contain nicotine or tobacco, such as cigarettes, e-cigarettes, and chewing tobacco. If you need help quitting, ask your health care provider.  If you are sexually active, practice safe sex. Use a condom or other form of protection in order to prevent STIs (sexually transmitted infections).  Talk with your health care provider about taking a low-dose aspirin or statin. What's next?  Go to your health care provider once a year for a well check visit.  Ask your health care provider how often you should have your eyes and teeth checked.  Stay up to date on all vaccines. This information is not intended to replace advice given to you by your health care provider. Make sure you discuss any questions you have with your health care provider. Document Revised: 07/26/2018 Document Reviewed: 07/26/2018 Elsevier Patient Education  2020 [employer].

## 2020-07-27 NOTE — Assessment & Plan Note (Signed)
Chronic and well-controlled °Continue Effexor at current dose °Encourage therapy °

## 2020-07-27 NOTE — Progress Notes (Signed)
Established patient visit   Patient: Elaine Melendez   DOB: Nov 07, 1955   64 y.o. Female  MRN: [id number] Visit Date: 07/27/2020  Today's healthcare provider: Lavon Paganini, MD   Chief Complaint  Patient presents with  . Follow-up   Subjective    HPI   Here for f/u AWV. Doing well. No concerns.  Social History   Tobacco Use  . Smoking status: Never Smoker  . Smokeless tobacco: Never Used  Vaping Use  . Vaping Use: Never used  Substance Use Topics  . Alcohol use: No    Alcohol/week: 0.0 standard drinks  . Drug use: Never       Medications: Outpatient Medications Prior to Visit  Medication Sig  . dimenhyDRINATE (DRAMAMINE) 50 MG tablet Take 50 mg by mouth every 8 (eight) hours as needed.  Marland Kitchen ibuprofen (ADVIL,MOTRIN) 200 MG tablet Take 400 mg by mouth daily as needed for moderate pain.   Marland Kitchen KLOR-CON M20 20 MEQ tablet TAKE 1 TABLET BY MOUTH EVERY DAY  . naratriptan (AMERGE) 2.5 MG tablet Take 2.5 mg by mouth every 4 (four) hours as needed for migraine.   . nystatin (MYCOSTATIN/NYSTOP) powder Apply topically 3 (three) times daily.  Marland Kitchen venlafaxine (EFFEXOR) 75 MG tablet TAKE 1 TABLET BY MOUTH EVERY DAY  . [DISCONTINUED] benzonatate (TESSALON) 100 MG capsule Take 1 capsule (100 mg total) by mouth 2 (two) times daily as needed for cough.  . [DISCONTINUED] bisacodyl (DULCOLAX) 5 MG EC tablet Take 5 mg by mouth daily as needed for moderate constipation.  . [DISCONTINUED] promethazine (PHENERGAN) 25 MG tablet Take 25 mg by mouth every 6 (six) hours as needed for nausea or vomiting.  (Patient not taking: Reported on 01/28/2020)   No facility-administered medications prior to visit.    Review of Systems  Constitutional: Negative.   HENT: Negative.   Eyes: Negative.   Respiratory: Negative.   Cardiovascular: Negative.   Gastrointestinal: Negative.   Endocrine: Negative.   Genitourinary: Negative.   Musculoskeletal: Positive for arthralgias.  Skin: Negative.    Allergic/Immunologic: Negative.   Neurological: Negative.   Psychiatric/Behavioral: Negative.       Objective    BP 136/82 (BP Location: Left Arm, Patient Position: Sitting, Cuff Size: Large)   Pulse 72   Temp 97.6 F (36.4 C) (Oral)   Resp 16   Ht 5\' 4"  (1.626 m)   Wt 215 lb (97.5 kg)   LMP  (LMP Unknown)   SpO2 97%   BMI 36.90 kg/m    Physical Exam Vitals reviewed.  Constitutional:      General: She is not in acute distress.    Appearance: Normal appearance. She is well-developed. She is not diaphoretic.  HENT:     Head: Normocephalic and atraumatic.     Right Ear: Tympanic membrane, ear canal and external ear normal.     Left Ear: Tympanic membrane, ear canal and external ear normal.  Eyes:     General: No scleral icterus.    Conjunctiva/sclera: Conjunctivae normal.     Pupils: Pupils are equal, round, and reactive to light.  Neck:     Thyroid: No thyromegaly.  Cardiovascular:     Rate and Rhythm: Normal rate and regular rhythm.     Pulses: Normal pulses.     Heart sounds: Normal heart sounds. No murmur heard.   Pulmonary:     Effort: Pulmonary effort is normal. No respiratory distress.     Breath sounds: Normal breath sounds. No  wheezing, rhonchi or rales.  Chest:     Comments: Breasts: breasts appear normal, no suspicious masses, no skin or nipple changes or axillary nodes.  Abdominal:     General: There is no distension.     Palpations: Abdomen is soft.     Tenderness: There is no abdominal tenderness.  Musculoskeletal:        General: No deformity.     Cervical back: Neck supple.     Right lower leg: No edema.     Left lower leg: No edema.  Lymphadenopathy:     Cervical: No cervical adenopathy.  Skin:    General: Skin is warm and dry.     Findings: No rash.  Neurological:     Mental Status: She is alert and oriented to person, place, and time. Mental status is at baseline.     Sensory: No sensory deficit.     Motor: No weakness.     Gait: Gait  normal.  Psychiatric:        Mood and Affect: Mood normal.        Behavior: Behavior normal.        Thought Content: Thought content normal.       No results found for any visits on 07/27/20.  Assessment & Plan     Problem List Items Addressed This Visit      Endocrine   Thyroid nodule    Stable and small Continue annual [place] x5 yrs for stability Recheck TSH      Relevant Orders   US THYROID   TSH     Other   Hypercholesteremia - Primary    Reviewed last lipid panel Not currently on a statin Recheck FLP and CMP Discussed diet and exercise        Relevant Orders   Comprehensive metabolic panel   Lipid panel   Anxiety and depression    Chronic and well controlled Continue Effexor at current dose Encourage therapy       Other Visit Diagnoses    Screening mammogram for breast cancer       Relevant Orders   MM 3D SCREEN BREAST BILATERAL   Leukocytosis, unspecified type       Relevant Orders   CBC w/Diff/Platelet   Need for influenza vaccination       Relevant Orders   Flu Vaccine QUAD High Dose(Fluad) (Completed)   Need for pneumococcal vaccination       Relevant Orders   Pneumococcal conjugate vaccine 13-valent IM (Completed)       Return in about 1 year (around 07/27/2021) for AWV, chronic disease f/u.      I, Lavon Paganini, MD, have reviewed all documentation for this visit. The documentation on 07/27/20 for the exam, diagnosis, procedures, and orders are all accurate and complete.   Gara Kincade, Dionne Bucy, MD, MPH [facility]

## 2020-07-27 NOTE — Assessment & Plan Note (Signed)
Stable and small Continue annual [place] x5 yrs for stability Recheck TSH

## 2020-07-28 LAB — CBC WITH DIFFERENTIAL/PLATELET
Basophils Absolute: 0.1 10*3/uL (ref 0.0–0.2)
Basos: 1 %
EOS (ABSOLUTE): 0.9 10*3/uL — ABNORMAL HIGH (ref 0.0–0.4)
Eos: 8 %
Hematocrit: 43.2 % (ref 34.0–46.6)
Hemoglobin: 15 g/dL (ref 11.1–15.9)
Immature Grans (Abs): 0 10*3/uL (ref 0.0–0.1)
Immature Granulocytes: 0 %
Lymphocytes Absolute: 3.9 10*3/uL — ABNORMAL HIGH (ref 0.7–3.1)
Lymphs: 37 %
MCH: 30.6 pg (ref 26.6–33.0)
MCHC: 34.7 g/dL (ref 31.5–35.7)
MCV: 88 fL (ref 79–97)
Monocytes Absolute: 0.8 10*3/uL (ref 0.1–0.9)
Monocytes: 7 %
Neutrophils Absolute: 4.9 10*3/uL (ref 1.4–7.0)
Neutrophils: 47 %
Platelets: 275 10*3/uL (ref 150–450)
RBC: 4.9 x10E6/uL (ref 3.77–5.28)
RDW: 12.1 % (ref 11.7–15.4)
WBC: 10.5 10*3/uL (ref 3.4–10.8)

## 2020-07-28 LAB — LIPID PANEL
Chol/HDL Ratio: 4.1 ratio (ref 0.0–4.4)
Cholesterol, Total: 233 mg/dL — ABNORMAL HIGH (ref 100–199)
HDL: 57 mg/dL (ref >39)
LDL Chol Calc (NIH): 155 mg/dL — ABNORMAL HIGH (ref 0–99)
Triglycerides: 120 mg/dL (ref 0–149)
VLDL Cholesterol Cal: 21 mg/dL (ref 5–40)

## 2020-07-28 LAB — COMPREHENSIVE METABOLIC PANEL
ALT: 10 IU/L (ref 0–32)
AST: 11 IU/L (ref 0–40)
Albumin/Globulin Ratio: 2 (ref 1.2–2.2)
Albumin: 4.5 g/dL (ref 3.8–4.8)
Alkaline Phosphatase: 113 IU/L (ref 44–121)
BUN/Creatinine Ratio: 22 (ref 12–28)
BUN: 14 mg/dL (ref 8–27)
Bilirubin Total: 0.4 mg/dL (ref 0.0–1.2)
CO2: 25 mmol/L (ref 20–29)
Calcium: 9.5 mg/dL (ref 8.7–10.3)
Chloride: 102 mmol/L (ref 96–106)
Creatinine, Ser: 0.65 mg/dL (ref 0.57–1.00)
GFR calc Af Amer: 107 mL/min/{1.73_m2} (ref >59)
GFR calc non Af Amer: 93 mL/min/{1.73_m2} (ref >59)
Globulin, Total: 2.2 g/dL (ref 1.5–4.5)
Glucose: 90 mg/dL (ref 65–99)
Potassium: 3.8 mmol/L (ref 3.5–5.2)
Sodium: 142 mmol/L (ref 134–144)
Total Protein: 6.7 g/dL (ref 6.0–8.5)

## 2020-07-28 LAB — TSH: TSH: 3 u[IU]/mL (ref 0.450–4.500)

## 2020-07-31 ENCOUNTER — Ambulatory Visit
Admission: RE | Admit: 2020-07-31 | Discharge: 2020-07-31 | Disposition: A | Discharge status: Home / Self Care | Payer: Medicare Other | Source: Ambulatory Visit | Attending: Family Medicine | Admitting: Family Medicine

## 2020-07-31 ENCOUNTER — Other Ambulatory Visit: Payer: Self-pay

## 2020-07-31 DIAGNOSIS — E041 Nontoxic single thyroid nodule: Secondary | ICD-10-CM | POA: Insufficient documentation

## 2020-08-19 ENCOUNTER — Ambulatory Visit (INDEPENDENT_AMBULATORY_CARE_PROVIDER_SITE_OTHER): Payer: Medicare Other

## 2020-08-19 ENCOUNTER — Other Ambulatory Visit: Payer: Self-pay

## 2020-08-19 DIAGNOSIS — Z23 Encounter for immunization: Secondary | ICD-10-CM

## 2020-10-06 ENCOUNTER — Other Ambulatory Visit: Payer: Self-pay | Admitting: Family Medicine

## 2020-12-01 ENCOUNTER — Telehealth: Payer: Medicare Other | Admitting: Physician Assistant

## 2020-12-01 DIAGNOSIS — J069 Acute upper respiratory infection, unspecified: Secondary | ICD-10-CM

## 2020-12-01 MED ORDER — BENZONATATE 100 MG PO CAPS: 100.0000 mg | ORAL_CAPSULE | Freq: Three times a day (TID) | ORAL | 0 refills | Status: DC | PRN

## 2020-12-01 NOTE — Progress Notes (Signed)
We are sorry you are not feeling well.  Here is how we plan to help!  Based on what you have shared with me, it looks like you may have a viral upper respiratory infection.  Upper respiratory infections are caused by a large number of viruses; however, rhinovirus is the most common cause.   Symptoms vary from person to person, with common symptoms including sore throat, cough, fatigue or lack of energy and feeling of general discomfort.  A low-grade fever of up to 100.4 may present, but is often uncommon.  Symptoms vary however, and are closely related to a person's age or underlying illnesses.  The most common symptoms associated with an upper respiratory infection are nasal discharge or congestion, cough, sneezing, headache and pressure in the ears and face.  These symptoms usually persist for about 3 to 10 days, but can last up to 2 weeks.  It is important to know that upper respiratory infections do not cause serious illness or complications in most cases.    Upper respiratory infections can be transmitted from person to person, with the most common method of transmission being a person's hands.  The virus is able to live on the skin and can infect other persons for up to 2 hours after direct contact.  Also, these can be transmitted when someone coughs or sneezes; thus, it is important to cover the mouth to reduce this risk.  To keep the spread of the illness at bay, good hand hygiene is very important.  This is an infection that is most likely caused by a virus. There are no specific treatments other than to help you with the symptoms until the infection runs its course.  We are sorry you are not feeling well.  Here is how we plan to help!   For nasal congestion, you may use an oral decongestants such as Mucinex D or if you have glaucoma or high blood pressure use plain Mucinex.  Saline nasal spray or nasal drops can help and can safely be used as often as needed for congestion.  For your congestion,  I have prescribed Fluticasone nasal spray one spray in each nostril twice a day  If you do not have a history of heart disease, hypertension, diabetes or thyroid disease, prostate/bladder issues or glaucoma, you may also use Sudafed to treat nasal congestion.  It is highly recommended that you consult with a pharmacist or your primary care physician to ensure this medication is safe for you to take.     If you have a cough, you may use cough suppressants such as Delsym and Robitussin.  If you have glaucoma or high blood pressure, you can also use Coricidin HBP.   For cough I have prescribed for you A prescription cough medication called Tessalon Perles 100 mg. You may take 1-2 capsules every 8 hours as needed for cough  If you have a sore or scratchy throat, use a saltwater gargle-  to  teaspoon of salt dissolved in a 4-ounce to 8-ounce glass of warm water.  Gargle the solution for approximately 15-30 seconds and then spit.  It is important not to swallow the solution.  You can also use throat lozenges/cough drops and Chloraseptic spray to help with throat pain or discomfort.  Warm or cold liquids can also be helpful in relieving throat pain.  For headache, pain or general discomfort, you can use Ibuprofen or Tylenol as directed.   Some authorities believe that zinc sprays or the use of   Echinacea may shorten the course of your symptoms.   HOME CARE . Only take medications as instructed by your medical team. . Be sure to drink plenty of fluids. Water is fine as well as fruit juices, sodas and electrolyte beverages. You may want to stay away from caffeine or alcohol. If you are nauseated, try taking small sips of liquids. How do you know if you are getting enough fluid? Your urine should be a pale yellow or almost colorless. . Get rest. . Taking a steamy shower or using a humidifier may help nasal congestion and ease sore throat pain. You can place a towel over your head and breathe in the steam  from hot water coming from a faucet. . Using a saline nasal spray works much the same way. . Cough drops, hard candies and sore throat lozenges may ease your cough. . Avoid close contacts especially the very young and the elderly . Cover your mouth if you cough or sneeze . Always remember to wash your hands.   GET HELP RIGHT AWAY IF: . You develop worsening fever. . If your symptoms do not improve within 10 days . You develop yellow or green discharge from your nose over 3 days. . You have coughing fits . You develop a severe head ache or visual changes. . You develop shortness of breath, difficulty breathing or start having chest pain . Your symptoms persist after you have completed your treatment plan  MAKE SURE YOU   Understand these instructions.  Will watch your condition.  Will get help right away if you are not doing well or get worse.  Your e-visit answers were reviewed by a board certified advanced clinical practitioner to complete your personal care plan. Depending upon the condition, your plan could have included both over the counter or prescription medications. Please review your pharmacy choice. If there is a problem, you may call our nursing hot line at and have the prescription routed to another pharmacy. Your safety is important to [place]. If you have drug allergies check your prescription carefully.   You can use MyChart to ask questions about today's visit, request a non-urgent call back, or ask for a work or school excuse for 24 hours related to this e-Visit. If it has been greater than 24 hours you will need to follow up with your provider, or enter a new e-Visit to address those concerns. You will get an e-mail in the next two days asking about your experience.  I hope that your e-visit has been valuable and will speed your recovery. Thank you for using e-visits.

## 2020-12-01 NOTE — Progress Notes (Signed)
I have spent 5 minutes in review of e-visit questionnaire, review and updating patient chart, medical decision making and response to patient.   Kenn Rekowski [place] [employer] [place], PA-C    

## 2021-01-02 ENCOUNTER — Other Ambulatory Visit: Payer: Self-pay | Admitting: Family Medicine

## 2021-01-02 NOTE — Telephone Encounter (Signed)
Patient will need an office visit for further refills. Requested Prescriptions  Pending Prescriptions Disp Refills  . venlafaxine (EFFEXOR) 75 MG tablet [Pharmacy Med Name: VENLAFAXINE HCL 75 MG TABLET] 90 tablet 0    Sig: TAKE 1 TABLET BY MOUTH EVERY DAY     Psychiatry: Antidepressants - SNRI - desvenlafaxine & venlafaxine Failed - 01/02/2021 12:49 AM      Failed - LDL in normal range and within 360 days    LDL Chol Calc (NIH)  Date Value Ref Range Status  07/27/2020 155 (H) 0 - 99 mg/dL Final         Failed - Total Cholesterol in normal range and within 360 days    Cholesterol, Total  Date Value Ref Range Status  07/27/2020 233 (H) 100 - 199 mg/dL Final         Failed - Valid encounter within last 6 months    Recent Outpatient Visits          5 months ago [place], Dionne Bucy, MD   9 months ago Encounter for [employer] annual wellness exam   [facility], [place], LPN   11 months ago Concussion without loss of consciousness, initial encounter   [facility] Jerrol Banana., MD   1 year ago Preoperative clearance   [facility] [place], Dionne Bucy, MD   1 year ago Adhesive capsulitis of right shoulder   [facility] [place], Dionne Bucy, MD             Passed - Triglycerides in normal range and within 360 days    Triglycerides  Date Value Ref Range Status  07/27/2020 120 0 - 149 mg/dL Final         Passed - Completed PHQ-2 or PHQ-9 in the last 360 days      Passed - Last BP in normal range    BP Readings from Last 1 Encounters:  07/27/20 136/82

## 2021-01-13 ENCOUNTER — Other Ambulatory Visit: Payer: Self-pay | Admitting: Family Medicine

## 2021-01-13 NOTE — Telephone Encounter (Signed)
Requested medications are due for refill today.  yes  Requested medications are on the active medications list.  yes  Last refill. 12/13/2019  Future visit scheduled.   yes  Notes to clinic.  Prescription is expired.

## 2021-02-09 ENCOUNTER — Other Ambulatory Visit: Payer: Self-pay

## 2021-02-09 ENCOUNTER — Ambulatory Visit (INDEPENDENT_AMBULATORY_CARE_PROVIDER_SITE_OTHER): Payer: Medicare Other | Admitting: Gastroenterology

## 2021-02-09 ENCOUNTER — Encounter: Payer: Self-pay | Admitting: Gastroenterology

## 2021-02-09 VITALS — BP 137/64 | HR 85 | Ht 64.0 in | Wt 209.2 lb

## 2021-02-09 DIAGNOSIS — R140 Abdominal distension (gaseous): Secondary | ICD-10-CM | POA: Diagnosis not present

## 2021-02-09 DIAGNOSIS — K219 Gastro-esophageal reflux disease without esophagitis: Secondary | ICD-10-CM | POA: Diagnosis not present

## 2021-02-09 DIAGNOSIS — R109 Unspecified abdominal pain: Secondary | ICD-10-CM

## 2021-02-09 MED ORDER — PANTOPRAZOLE SODIUM 40 MG PO TBEC: 40.0000 mg | DELAYED_RELEASE_TABLET | Freq: Every day | ORAL | 6 refills | Status: DC

## 2021-02-09 NOTE — Progress Notes (Signed)
Primary Care Physician: Virginia Crews, MD  Primary Gastroenterologist:  Dr. Lucilla Lame  Chief Complaint  Patient presents with   Abdominal Cramping   Gastroesophageal Reflux     HPI: Elaine Melendez is a 65 y.o. female here with a report of abdominal cramps and abdominal pain.  The patient had been seen by Dr. Bonna Gains for colonoscopy back in 2020.  There was a lipoma found and multiple hyperplastic polyps.  The patient now reports an upset stomach after he eats with reflux. The patient had seen my partner for colonoscopy back in 2020 because she could not get in to see me but had seen me previous to that.  She now reports that she has some abdominal bloating with bouts of diarrhea.  She states that it happens every few days.  On average she reports it to be every 4 to 5 days and she has urgency and has to run to the bathroom.  The patient also states that those times she is having loose stools.  There is no report of any unexplained weight loss and she states that she is actually trying to lose weight.  The patient also reports that she has reflux symptoms usually at night with episodes of burning in her throat.  Past Medical History:  Diagnosis Date   Anxiety    Cannot sleep 05/10/2017   Dense breast tissue 05/25/2017   Dysrhythmia    remote hs tachycardia>15 yrs ago, no meds   GERD (gastroesophageal reflux disease)    Headache    Hypercholesteremia 01/31/2007   Joint effusion 12/05/2017   Left breast mass 06/21/2017   Meniere's disease    Polyarthralgia 12/05/2017    Current Outpatient Medications  Medication Sig Dispense Refill   dimenhyDRINATE (DRAMAMINE) 50 MG tablet Take 50 mg by mouth every 8 (eight) hours as needed.     ibuprofen (ADVIL,MOTRIN) 200 MG tablet Take 400 mg by mouth daily as needed for moderate pain.      KLOR-CON M20 20 MEQ tablet TAKE 1 TABLET BY MOUTH EVERY DAY 90 tablet 2   naratriptan (AMERGE) 2.5 MG tablet Take 2.5 mg by mouth every 4 (four)  hours as needed for migraine.   1   nystatin (MYCOSTATIN/NYSTOP) powder Apply topically 3 (three) times daily. 30 g 3   pantoprazole (PROTONIX) 40 MG tablet Take 1 tablet (40 mg total) by mouth at bedtime. 30 tablet 6   venlafaxine (EFFEXOR) 75 MG tablet TAKE 1 TABLET BY MOUTH EVERY DAY 90 tablet 0   benzonatate (TESSALON) 100 MG capsule Take 1 capsule (100 mg total) by mouth 3 (three) times daily as needed for cough. (Patient not taking: Reported on 02/09/2021) 30 capsule 0   No current facility-administered medications for this visit.    Allergies as of 02/09/2021 - Review Complete 02/09/2021  Allergen Reaction Noted   Nickel Rash 01/31/2007    ROS:  General: Negative for anorexia, weight loss, fever, chills, fatigue, weakness. ENT: Negative for hoarseness, difficulty swallowing , nasal congestion. CV: Negative for chest pain, angina, palpitations, dyspnea on exertion, peripheral edema.  Respiratory: Negative for dyspnea at rest, dyspnea on exertion, cough, sputum, wheezing.  GI: See history of present illness. GU:  Negative for dysuria, hematuria, urinary incontinence, urinary frequency, nocturnal urination.  Endo: Negative for unusual weight change.    Physical Examination:   BP 137/64 (BP Location: Left Arm, Patient Position: Sitting, Cuff Size: Large)   Pulse 85   Ht 5\' 4"  (1.626 m)  Wt 209 lb 3.2 oz (94.9 kg)   LMP  (LMP Unknown)   BMI 35.91 kg/m   General: Well-nourished, well-developed in no acute distress.  Eyes: No icterus. Conjunctivae pink. Lungs: Clear to auscultation bilaterally. Non-labored. Heart: Regular rate and rhythm, no murmurs rubs or gallops.  Abdomen: Bowel sounds are normal, nontender, nondistended, no hepatosplenomegaly or masses, no abdominal bruits or hernia , no rebound or guarding.   Extremities: No lower extremity edema. No clubbing or deformities. Neuro: Alert and oriented x 3.  Grossly intact. Skin: Warm and dry, no jaundice.   Psych: Alert  and cooperative, normal mood and affect.  Labs:    Imaging Studies: No results found.  Assessment and Plan:   Elaine Melendez is a 65 y.o. y/o female who comes in today after having a colonoscopy by my partner in 2020.  The patient has been told that because she is not having any worry symptoms at the present time she does not need an upper endoscopy.  The patient should be taking her medication in the evening since most of her heartburn symptoms are at night.  The patient has also been told to try and avoid dairy products since she seems to have dairy products in her diet.  She is also been advised to keep a food diary to see when her attacks of diarrhea have been what foods it may be related to.  The patient has been explained the plan and agrees with it.     Lucilla Lame, MD. Marval Regal    Note: This dictation was prepared with Dragon dictation along with smaller phrase technology. Any transcriptional errors that result from this process are unintentional.

## 2021-02-18 ENCOUNTER — Ambulatory Visit: Payer: Self-pay | Admitting: *Deleted

## 2021-02-18 NOTE — Telephone Encounter (Signed)
Reason for Disposition  [1] Continuous (nonstop) coughing interferes with work or school AND [2] no improvement using cough treatment per Care Advice  Answer Assessment - Initial Assessment Questions 1. COVID-19 DIAGNOSIS: "Who made your COVID-19 diagnosis?" "Was it confirmed by a positive lab test or self-test?" If not diagnosed by a doctor (or NP/PA), ask "Are there lots of cases (community spread) where you live?" Note: See public health department website, if unsure.     Took at home test positive today  2. COVID-19 EXPOSURE: "Was there any known exposure to COVID before the symptoms began?" CDC Definition of close contact: within 6 feet (2 meters) for a total of 15 minutes or more over a 24-hour period.      No sure  3. ONSET: "When did the COVID-19 symptoms start?"      Tuesday  4. WORST SYMPTOM: "What is your worst symptom?" (e.g., cough, fever, shortness of breath, muscle aches)     cough 5. COUGH: "Do you have a cough?" If Yes, ask: "How bad is the cough?"       Yes , non stop coughing  6. FEVER: "Do you have a fever?" If Yes, ask: "What is your temperature, how was it measured, and when did it start?"     Yes Tuesday  7. RESPIRATORY STATUS: "Describe your breathing?" (e.g., shortness of breath, wheezing, unable to speak)      Ok  8. BETTER-SAME-WORSE: "Are you getting better, staying the same or getting worse compared to yesterday?"  If getting worse, ask, "In what way?"     Worse  9. HIGH RISK DISEASE: "Do you have any chronic medical problems?" (e.g., asthma, heart or lung disease, weak immune system, obesity, etc.)     Lung issue 2 years ago  61. VACCINE: "Have you had the COVID-19 vaccine?" If Yes, ask: "Which one, how many shots, when did you get it?"       Pfizer x 2 11. BOOSTER: "Have you received your COVID-19 booster?" If Yes, ask: "Which one and when did you get it?"       Pfizer x 2  12. PREGNANCY: "Is there any chance you are pregnant?" "When was your last menstrual  period?"       na 13. OTHER SYMPTOMS: "Do you have any other symptoms?"  (e.g., chills, fatigue, headache, loss of smell or taste, muscle pain, sore throat)       Sore throat , headache, fever x one day and now non stop coughing and stopped up nose . 14. O2 SATURATION MONITOR:  "Do you use an oxygen saturation monitor (pulse oximeter) at home?" If Yes, ask "What is your reading (oxygen level) today?" "What is your usual oxygen saturation reading?" (e.g., 95%)       97% on RA  Protocols used: Coronavirus (COVID-19) Diagnosed or Suspected-A-AH

## 2021-02-18 NOTE — Telephone Encounter (Signed)
Patient called to reports she is positive for covid and requesting advise. Called patient to review symptoms. Tested positive for covid today with at home test. Symptoms started on Tuesday with headache, sore throat, fever. Now patient is having non stop coughing and "stopped up " nose. O2 sat at 97% RA. Reviewed CDC isolation recommendations. Criteria for self-isolation if you test positive for COVID-19, regardless of vaccination status:  -If you have mild symptoms that are resolving or have resolved, isolate at home for 5 days since symptoms started AND continue to wear a well-fitted mask when around others in the home and in public for 5 additional days after isolation is completed -If you have a fever and/or moderate to severe symptoms, isolate for at least 10 days since the symptoms started AND until you are fever free for at least 24 hours without the use of fever-reducing medications -If you tested positive and did not have symptoms, isolate for at least 5 days after your positive test  Use over-the-counter medications for symptoms.If you develop respiratory issues/distress, seek medical care in the Emergency Department.  If you must leave home or if you have to be around others please wear a mask. Please limit contact with immediate family members in the home, practice social distancing, frequent handwashing and clean hard surfaces touched frequently with household cleaning products. Members of your household will also need to quarantine and test. No available virtual appt with PCP until September. Care advise given. Patient verbalized understanding of care advise and to call back or go to [facility] or ED if symptoms worsen.

## 2021-02-19 MED ORDER — NIRMATRELVIR/RITONAVIR (PAXLOVID)TABLET: 3.0000 | ORAL_TABLET | Freq: Two times a day (BID) | ORAL | 0 refills | Status: AC

## 2021-02-19 NOTE — Telephone Encounter (Signed)
FYI

## 2021-02-19 NOTE — Telephone Encounter (Signed)
Sent in paxlovid if she wants to try that. Needs to start by tomorrow if she is going to take it to be within 5 day window.

## 2021-02-19 NOTE — Telephone Encounter (Signed)
Patient advised.KW 

## 2021-06-01 ENCOUNTER — Encounter: Payer: Self-pay | Admitting: General Surgery

## 2021-07-12 ENCOUNTER — Encounter: Payer: Self-pay | Admitting: Family Medicine

## 2021-07-14 ENCOUNTER — Ambulatory Visit: Payer: Self-pay | Admitting: *Deleted

## 2021-07-14 NOTE — Telephone Encounter (Signed)
Patient advised to do COVID home test.

## 2021-07-14 NOTE — Telephone Encounter (Signed)
     Reason for Disposition  [1] Continuous (nonstop) coughing interferes with work or school AND [2] no improvement using cough treatment per Care Advice  Answer Assessment - Initial Assessment Questions 1. ONSET: "When did the cough begin?"      Saturday AM 2. SEVERITY: "How bad is the cough today?"      Keeps awake at night, bad spells during day 3. SPUTUM: "Describe the color of your sputum" (none, dry cough; clear, white, yellow, green)     Dry 4. HEMOPTYSIS: "Are you coughing up any blood?" If so ask: "How much?" (flecks, streaks, tablespoons, etc.)     no 5. DIFFICULTY BREATHING: "Are you having difficulty breathing?" If Yes, ask: "How bad is it?" (e.g., mild, moderate, severe)    - MILD: No SOB at rest, mild SOB with walking, speaks normally in sentences, can lie down, no retractions, pulse < 100.    - MODERATE: SOB at rest, SOB with minimal exertion and prefers to sit, cannot lie down flat, speaks in phrases, mild retractions, audible wheezing, pulse 100-120.    - SEVERE: Very SOB at rest, speaks in single words, struggling to breathe, sitting hunched forward, retractions, pulse > 120      no 6. FEVER: "Do you have a fever?" If Yes, ask: "What is your temperature, how was it measured, and when did it start?"     Not presently 7. CARDIAC HISTORY: "Do you have any history of heart disease?" (e.g., heart attack, congestive heart failure)       8. LUNG HISTORY: "Do you have any history of lung disease?"  (e.g., pulmonary embolus, asthma, emphysema)      9. PE RISK FACTORS: "Do you have a history of blood clots?" (or: recent major surgery, recent prolonged travel, bedridden)      10. OTHER SYMPTOMS: "Do you have any other symptoms?" (e.g., runny nose, wheezing, chest pain)       Stuffy nose, Afrin helps a little  Protocols used: Cough - Acute Non-Productive-A-AH

## 2021-07-14 NOTE — Telephone Encounter (Signed)
Pt reports cough, onset Saturday. States non-productive. Also reports stuffy nose, alternating with runny. Denies SOB, no sore throat, denies fever. States when has cold "Cough usually stays on for a while." Has taken NyQuil and Dayquil, tylenol and Afrin, ineffective. Voice hoarse. Requesting cough med be called in for her. Advised may need Evisit. CAre advise given per protocol Assured pt NT would route to practice for PCPs review and final disposition. Please advise: [phone]

## 2021-07-15 ENCOUNTER — Ambulatory Visit (INDEPENDENT_AMBULATORY_CARE_PROVIDER_SITE_OTHER): Payer: Medicare Other | Admitting: Physician Assistant

## 2021-07-15 ENCOUNTER — Other Ambulatory Visit: Payer: Self-pay

## 2021-07-15 ENCOUNTER — Encounter: Payer: Self-pay | Admitting: Physician Assistant

## 2021-07-15 VITALS — BP 144/71 | HR 79 | Temp 98.4°F | Ht 64.0 in | Wt 209.0 lb

## 2021-07-15 DIAGNOSIS — J208 Acute bronchitis due to other specified organisms: Secondary | ICD-10-CM

## 2021-07-15 DIAGNOSIS — R0981 Nasal congestion: Secondary | ICD-10-CM

## 2021-07-15 DIAGNOSIS — M7989 Other specified soft tissue disorders: Secondary | ICD-10-CM | POA: Insufficient documentation

## 2021-07-15 MED ORDER — BENZONATATE 100 MG PO CAPS: 100.0000 mg | ORAL_CAPSULE | Freq: Two times a day (BID) | ORAL | 0 refills | Status: DC | PRN

## 2021-07-15 MED ORDER — FLUTICASONE PROPIONATE 50 MCG/ACT NA SUSP: 2.0000 | Freq: Every day | NASAL | 6 refills | Status: DC

## 2021-07-15 NOTE — Progress Notes (Signed)
Acute Office Visit  Subjective:    Patient ID: Elaine Melendez, female    DOB: [birth year], 65 y.o.   MRN: [id number]  Chief Complaint  Patient presents with   Cough    Started on 07/10/21; dry, hacking; negative home Covid test this morning; taking Robitussin with minimal relief; not sleeping well due to cough   Nasal Congestion    Using Afrin with some relief   Hoarse  Elaine Melendez is a 65 y/o female who presents today with dry persistent cough, loss of voice, nasal congestion, rhinorrhea, lack of sleep, fatigue x 6 days. Her father recently had a heart attack, but is well now, and she thinks she caught something from the hospital when she was with him. Denies SOB, chest pain, wheezing, fevers. Takes celebrex for shoulder pain, has been helping some. Taking robitussin Dm, mucinex, afrin spray. Reports negative COVID test. No one else in family is ill.    Past Medical History:  Diagnosis Date   Anxiety    Cannot sleep 05/10/2017   Dense breast tissue 05/25/2017   Dysrhythmia    remote hs tachycardia>15 yrs ago, no meds   GERD (gastroesophageal reflux disease)    Headache    Hypercholesteremia 01/31/2007   Joint effusion 12/05/2017   Left breast mass 06/21/2017   Meniere's disease    Polyarthralgia 12/05/2017    Past Surgical History:  Procedure Laterality Date   ANKLE FRACTURE SURGERY Right    BREAST EXCISIONAL BIOPSY Left 09/11/2018   neg high risk lesion   BREAST LUMPECTOMY     CHOLECYSTECTOMY     COLONOSCOPY WITH PROPOFOL N/A 03/27/2019   Procedure: COLONOSCOPY WITH PROPOFOL;  Surgeon: Virgel Manifold, MD;  Location: [facility] END[facility]COPY;  Service: Gastroenterology;  Laterality: N/A;   ENDOBRONCHIAL ULTRASOUND N/A 02/27/2018   Procedure: ENDOBRONCHIAL ULTRASOUND;  Surgeon: Laverle Hobby, MD;  Location: [facility] ORS;  Service: Pulmonary;  Laterality: N/A;   FRACTURE SURGERY     RADIOACTIVE SEED GUIDED EXCISIONAL BREAST BIOPSY Left 09/12/2017   Procedure: 2 RADIOACTIVE SEED GUIDED  EXCISIONAL LEFT BREAST BIOPSY ERAS PATHWAY;  Surgeon: Stark Klein, MD;  Location: [place];  Service: General;  Laterality: Left;   TOTAL SHOULDER REPLACEMENT      Family History  Problem Relation Age of Onset   Alzheimer's disease Mother    Colon polyps Mother        cancerous; partial colon removal   Squamous cell carcinoma Father    Hypertension Father    Heart attack Father    Breast cancer Paternal Aunt    Breast cancer Maternal Grandmother     Social History   Socioeconomic History   Marital status: Married    Spouse name: Kamorah Nevils   Number of children: 2   Years of education: 14   Highest education level: Associate degree: occupational, Hotel manager, or vocational program  Occupational History   Occupation: retired  Tobacco Use   Smoking status: Never   Smokeless tobacco: Never  Vaping Use   Vaping Use: Never used  Substance and Sexual Activity   Alcohol use: No    Alcohol/week: 0.0 standard drinks   Drug use: Never   Sexual activity: Yes    Partners: Male  Other Topics Concern   Not on file  Social History Narrative   Not on file   Social Determinants of Health   Financial Resource Strain: Not on file  Food Insecurity: Not on file  Transportation Needs: Not on file  Physical Activity:  Not on file  Stress: Not on file  Social Connections: Not on file  Intimate Partner Violence: Not on file    Outpatient Medications Prior to Visit  Medication Sig Dispense Refill   celecoxib (CELEBREX) 200 MG capsule Take 200 mg by mouth 2 (two) times daily as needed.     dimenhyDRINATE (DRAMAMINE) 50 MG tablet Take 50 mg by mouth every 8 (eight) hours as needed.     KLOR-CON M20 20 MEQ tablet TAKE 1 TABLET BY MOUTH EVERY DAY 90 tablet 2   naratriptan (AMERGE) 2.5 MG tablet Take 2.5 mg by mouth every 4 (four) hours as needed for migraine.   1   nystatin (MYCOSTATIN/NYSTOP) powder Apply topically 3 (three) times daily. 30 g 3   pantoprazole (PROTONIX)  40 MG tablet Take 1 tablet (40 mg total) by mouth at bedtime. 30 tablet 6   venlafaxine (EFFEXOR) 75 MG tablet TAKE 1 TABLET BY MOUTH EVERY DAY 90 tablet 0   benzonatate (TESSALON) 100 MG capsule Take 1 capsule (100 mg total) by mouth 3 (three) times daily as needed for cough. (Patient not taking: Reported on 02/09/2021) 30 capsule 0   ibuprofen (ADVIL,MOTRIN) 200 MG tablet Take 400 mg by mouth daily as needed for moderate pain.      No facility-administered medications prior to visit.    Allergies  Allergen Reactions   Nickel Rash    Review of Systems  Constitutional:  Positive for fatigue and fever.  HENT:  Positive for congestion, postnasal drip, rhinorrhea and sinus pressure.   Respiratory:  Positive for cough. Negative for shortness of breath and wheezing.   Cardiovascular:  Negative for chest pain.  Gastrointestinal:  Negative for abdominal pain and nausea.  Neurological:  Negative for weakness and light-headedness.      Objective:    Physical Exam Constitutional:      General: She is awake.     Appearance: She is well-developed.  HENT:     Head: Normocephalic.  Eyes:     Conjunctiva/sclera: Conjunctivae normal.  Cardiovascular:     Rate and Rhythm: Normal rate and regular rhythm.     Heart sounds: Normal heart sounds.  Pulmonary:     Effort: Pulmonary effort is normal.     Breath sounds: Examination of the right-middle field reveals rhonchi. Examination of the right-lower field reveals rhonchi. Rhonchi present. No decreased breath sounds, wheezing or rales.     Comments: Some mild rhonchi lower right lung, cleared with cough.  Skin:    General: Skin is warm.  Neurological:     Mental Status: She is alert and oriented to person, place, and time.  Psychiatric:        Attention and Perception: Attention normal.        Mood and Affect: Mood normal.        Speech: Speech normal.        Behavior: Behavior is cooperative.    Ht 5\' 4"  (1.626 m)   Wt 209 lb (94.8 kg)    LMP  (LMP Unknown)   BMI 35.87 kg/m  Wt Readings from Last 3 Encounters:  07/15/21 209 lb (94.8 kg)  02/09/21 209 lb 3.2 oz (94.9 kg)  07/27/20 215 lb (97.5 kg)    Health Maintenance Due  Topic Date Due   COVID-19 Vaccine (3 - Booster for Pfizer series) 10/14/2020   INFLUENZA VACCINE  03/15/2021   Pneumonia Vaccine 48+ Years old (2 - PPSV23 if available, else PCV20) 07/27/2021   Lab Results  Component Value Date   TSH 3.000 07/27/2020   Lab Results  Component Value Date   WBC 10.5 07/27/2020   HGB 15.0 07/27/2020   HCT 43.2 07/27/2020   MCV 88 07/27/2020   PLT 275 07/27/2020   Lab Results  Component Value Date   NA 142 07/27/2020   K 3.8 07/27/2020   CO2 25 07/27/2020   GLUCOSE 90 07/27/2020   BUN 14 07/27/2020   CREATININE 0.65 07/27/2020   BILITOT 0.4 07/27/2020   ALKPHOS 113 07/27/2020   AST 11 07/27/2020   ALT 10 07/27/2020   PROT 6.7 07/27/2020   ALBUMIN 4.5 07/27/2020   CALCIUM 9.5 07/27/2020   ANIONGAP 14 02/02/2018   Lab Results  Component Value Date   CHOL 233 (H) 07/27/2020   Lab Results  Component Value Date   HDL 57 07/27/2020   Lab Results  Component Value Date   LDLCALC 155 (H) 07/27/2020   Lab Results  Component Value Date   TRIG 120 07/27/2020   Lab Results  Component Value Date   CHOLHDL 4.1 07/27/2020   Lab Results  Component Value Date   HGBA1C 5.3 09/13/2019       Assessment & Plan:   Acute bronchitis Advised rest, fluids, tylenol, mucinex, dayquil.  She can take 25- 50 mg of benadryl at night to help with rhinorrhea/congestion. Rx tessalon pearles, can take BID if needed. Rx Flonase, d/c afrin as it has been 6 days of use. Can use flonase once or twice daily. Advised pt to stay away from father until she is feeling better.  If symptoms do not improve in the next 3-5 days or worsen (develops fevers) at any point, please call office. Pt verbalized agreement.  I, Mikey Kirschner, PA-C have reviewed all documentation  for this visit. The documentation on  07/15/2021 for the exam, diagnosis, procedures, and orders are all accurate and complete.   Mikey Kirschner, PA-C [place]

## 2021-07-20 ENCOUNTER — Encounter: Payer: Self-pay | Admitting: Physician Assistant

## 2021-07-20 ENCOUNTER — Ambulatory Visit: Payer: Self-pay

## 2021-07-20 ENCOUNTER — Other Ambulatory Visit: Payer: Self-pay | Admitting: Physician Assistant

## 2021-07-20 DIAGNOSIS — J208 Acute bronchitis due to other specified organisms: Secondary | ICD-10-CM

## 2021-07-20 DIAGNOSIS — R051 Acute cough: Secondary | ICD-10-CM

## 2021-07-20 MED ORDER — GUAIFENESIN-CODEINE 100-10 MG/5ML PO SOLN: 5.0000 mL | Freq: Every day | ORAL | 0 refills | Status: DC

## 2021-07-20 NOTE — Progress Notes (Signed)
Pt not sleeping, OTC meds and tessalon not helping

## 2021-07-20 NOTE — Telephone Encounter (Signed)
Pt called back, she states that she is still having a dry hacky cough that hasn't improved. She was seen in office on 07/15/21 for bronchitis and she says her symptoms are improved except for the cough. She is using the Tessalon perles and Robitussin DM and is having trouble sleeping at night d/t the cough. Pt denies any difficulty breathing or chest pain. Attempted to schedule appt but reached out to [place] and spoke with [facility], CMA who said to send message back to [facility] and will if see medication can be sent to pharmacy or if pt needs to be seen again. Verified pharmacy on file and notified pt of information from [facility]. Care advice given and pt verbalized understanding. No other questions/concerns noted.     Message from Jodie Echevaria sent at 07/20/2021 12:01 PM EST  Patient called in to say that she have been taking her cough medication given at her visit last week sometimes with Robitussin DM at times but that the cough is still lingering. She is not able to sleep well at night due to the coughing and concerned as to what to do Ph# [phone]   Reason for Disposition  [1] Continuous (nonstop) coughing interferes with work or school AND [2] no improvement using cough treatment per Care Advice  Answer Assessment - Initial Assessment Questions 1. ONSET: "When did the cough begin?"      10 days ago 2. SEVERITY: "How bad is the cough today?"      Mild-moderate 3. SPUTUM: "Describe the color of your sputum" (none, dry cough; clear, white, yellow, green)     Dry cough 4. HEMOPTYSIS: "Are you coughing up any blood?" If so ask: "How much?" (flecks, streaks, tablespoons, etc.)     No 5. DIFFICULTY BREATHING: "Are you having difficulty breathing?" If Yes, ask: "How bad is it?" (e.g., mild, moderate, severe)    - MILD: No SOB at rest, mild SOB with walking, speaks normally in sentences, can lie down, no retractions, pulse < 100.    - MODERATE: SOB at rest, SOB with minimal exertion and prefers  to sit, cannot lie down flat, speaks in phrases, mild retractions, audible wheezing, pulse 100-120.    - SEVERE: Very SOB at rest, speaks in single words, struggling to breathe, sitting hunched forward, retractions, pulse > 120      No 6. FEVER: "Do you have a fever?" If Yes, ask: "What is your temperature, how was it measured, and when did it start?"     No 7. CARDIAC HISTORY: "Do you have any history of heart disease?" (e.g., heart attack, congestive heart failure)      No 8. LUNG HISTORY: "Do you have any history of lung disease?"  (e.g., pulmonary embolus, asthma, emphysema)     No 9. PE RISK FACTORS: "Do you have a history of blood clots?" (or: recent major surgery, recent prolonged travel, bedridden)     No 10. OTHER SYMPTOMS: "Do you have any other symptoms?" (e.g., runny nose, wheezing, chest pain)       No, symptoms have improved 11. PREGNANCY: "Is there any chance you are pregnant?" "When was your last menstrual period?"       No 12. TRAVEL: "Have you traveled out of the country in the last month?" (e.g., travel history, exposures)       No  Protocols used: Cough - Acute Non-Productive-A-AH

## 2021-07-21 NOTE — Telephone Encounter (Signed)
Patient advised.

## 2021-08-13 NOTE — Progress Notes (Signed)
Annual Wellness Visit     Patient: Elaine Melendez, Female    DOB: Jul 07, 1956, 65 y.o.   MRN: [id number] Visit Date: 08/17/2021  Today's Provider: Lavon Paganini, MD   Chief Complaint  Patient presents with   Medicare Wellness   I,Sulibeya S Dimas,acting as a scribe for Lavon Paganini, MD.,have documented all relevant documentation on the behalf of Lavon Paganini, MD,as directed by  Lavon Paganini, MD while in the presence of Lavon Paganini, MD.  Subjective    Elaine Melendez is a 65 y.o. female who presents today for her Annual Wellness Visit. She reports consuming a general diet. The patient does not participate in regular exercise at present. She generally feels well. She reports sleeping well. She does have additional problems to discuss today.   Depression        Associated symptoms include myalgias and headaches.  Past medical history includes anxiety.   Anxiety Symptoms include dizziness.    Hyperlipidemia Associated symptoms include myalgias.     Medications: Outpatient Medications Prior to Visit  Medication Sig   celecoxib (CELEBREX) 200 MG capsule Take 200 mg by mouth 2 (two) times daily as needed.   dimenhyDRINATE (DRAMAMINE) 50 MG tablet Take 50 mg by mouth every 8 (eight) hours as needed.   fluticasone (FLONASE) 50 MCG/ACT nasal spray Place 2 sprays into both nostrils daily.   guaiFENesin-codeine 100-10 MG/5ML syrup Take 5 mLs by mouth at bedtime. As needed. Do not take with other OTC cough suppressants.   nystatin (MYCOSTATIN/NYSTOP) powder Apply topically 3 (three) times daily.   pantoprazole (PROTONIX) 40 MG tablet Take 1 tablet (40 mg total) by mouth at bedtime.   [DISCONTINUED] KLOR-CON M20 20 MEQ tablet TAKE 1 TABLET BY MOUTH EVERY DAY   [DISCONTINUED] venlafaxine (EFFEXOR) 75 MG tablet TAKE 1 TABLET BY MOUTH EVERY DAY   [DISCONTINUED] benzonatate (TESSALON) 100 MG capsule Take 1 capsule (100 mg total) by mouth 2 (two) times daily as  needed for cough.   [DISCONTINUED] naratriptan (AMERGE) 2.5 MG tablet Take 2.5 mg by mouth every 4 (four) hours as needed for migraine.    No facility-administered medications prior to visit.    Allergies  Allergen Reactions   Nickel Rash    Patient Care Team: Virginia Crews, MD as PCP - General (Family Medicine) Lorelee Cover., MD (Ophthalmology) Stark Klein, MD as Consulting Physician (General Surgery) Thornton Park, MD as Referring Physician (Orthopedic Surgery) Lovell Sheehan, MD as Consulting Physician (Orthopedic Surgery) Virgel Manifold, MD as Consulting Physician (Gastroenterology) Nestor Lewandowsky, MD (Inactive) as Referring Physician (Cardiothoracic Surgery) Orie Rout, MD as Referring Physician (Specialist)  Review of Systems  Constitutional: Negative.   HENT: Negative.    Eyes: Negative.   Respiratory: Negative.    Cardiovascular:  Positive for leg swelling.  Gastrointestinal: Negative.   Endocrine: Negative.   Genitourinary: Negative.   Musculoskeletal:  Positive for myalgias.  Skin: Negative.   Allergic/Immunologic: Negative.   Neurological:  Positive for dizziness and headaches.  Hematological: Negative.   Psychiatric/Behavioral:  Positive for depression.    Last CBC Lab Results  Component Value Date   WBC 10.5 07/27/2020   HGB 15.0 07/27/2020   HCT 43.2 07/27/2020   MCV 88 07/27/2020   MCH 30.6 07/27/2020   RDW 12.1 07/27/2020   PLT 275 95/28/4132   Last metabolic panel Lab Results  Component Value Date   GLUCOSE 90 07/27/2020   NA 142 07/27/2020   K 3.8 07/27/2020  CL 102 07/27/2020   CO2 25 07/27/2020   BUN 14 07/27/2020   CREATININE 0.65 07/27/2020   GFRNONAA 93 07/27/2020   CALCIUM 9.5 07/27/2020   PROT 6.7 07/27/2020   ALBUMIN 4.5 07/27/2020   LABGLOB 2.2 07/27/2020   AGRATIO 2.0 07/27/2020   BILITOT 0.4 07/27/2020   ALKPHOS 113 07/27/2020   AST 11 07/27/2020   ALT 10 07/27/2020   ANIONGAP 14 02/02/2018    Last lipids Lab Results  Component Value Date   CHOL 233 (H) 07/27/2020   HDL 57 07/27/2020   LDLCALC 155 (H) 07/27/2020   TRIG 120 07/27/2020   CHOLHDL 4.1 07/27/2020   Last hemoglobin A1c Lab Results  Component Value Date   HGBA1C 5.3 09/13/2019   Last thyroid functions Lab Results  Component Value Date   TSH 3.000 07/27/2020   T3TOTAL 138 04/11/2018   T4TOTAL 10.0 04/11/2018        Objective    Vitals: BP 119/74 (BP Location: Right Arm, Patient Position: Sitting, Cuff Size: Large)    Pulse 72    Temp 98.4 F (36.9 C) (Oral)    Resp 16    Ht 5\' 4"  (1.626 m)    Wt 209 lb 9.6 oz (95.1 kg)    LMP  (LMP Unknown)    SpO2 99%    BMI 35.98 kg/m  BP Readings from Last 3 Encounters:  08/17/21 119/74  07/15/21 (!) 144/71  02/09/21 137/64   Wt Readings from Last 3 Encounters:  08/17/21 209 lb 9.6 oz (95.1 kg)  07/15/21 209 lb (94.8 kg)  02/09/21 209 lb 3.2 oz (94.9 kg)      Physical Exam Vitals reviewed.  Constitutional:      General: She is not in acute distress.    Appearance: Normal appearance. She is well-developed. She is not diaphoretic.  HENT:     Head: Normocephalic and atraumatic.     Right Ear: Tympanic membrane, ear canal and external ear normal.     Left Ear: Tympanic membrane, ear canal and external ear normal.     Nose: Nose normal.     Mouth/Throat:     Mouth: Mucous membranes are moist.     Pharynx: Oropharynx is clear. No oropharyngeal exudate.  Eyes:     General: No scleral icterus.    Conjunctiva/sclera: Conjunctivae normal.     Pupils: Pupils are equal, round, and reactive to light.  Neck:     Thyroid: No thyromegaly.  Cardiovascular:     Rate and Rhythm: Normal rate and regular rhythm.     Pulses: Normal pulses.     Heart sounds: Normal heart sounds. No murmur heard. Pulmonary:     Effort: Pulmonary effort is normal. No respiratory distress.     Breath sounds: Normal breath sounds. No wheezing or rales.  Chest:     Comments: Breasts:  right breast normal without mass, skin or nipple changes or axillary nodes, abnormal mass palpable L breast subaerolar  Abdominal:     General: There is no distension.     Palpations: Abdomen is soft.     Tenderness: There is no abdominal tenderness.  Musculoskeletal:        General: No deformity.     Cervical back: Neck supple.     Right lower leg: No edema.     Left lower leg: No edema.  Lymphadenopathy:     Cervical: No cervical adenopathy.  Skin:    General: Skin is warm and dry.  Findings: No rash.  Neurological:     Mental Status: She is alert and oriented to person, place, and time. Mental status is at baseline.     Sensory: No sensory deficit.     Motor: No weakness.     Gait: Gait normal.  Psychiatric:        Mood and Affect: Mood normal.        Behavior: Behavior normal.        Thought Content: Thought content normal.     Most recent functional status assessment: In your present state of health, do you have any difficulty performing the following activities: 08/17/2021  Hearing? Y  Vision? N  Difficulty concentrating or making decisions? N  Walking or climbing stairs? N  Dressing or bathing? N  Doing errands, shopping? N  Preparing Food and eating ? -  Using the Toilet? -  In the past six months, have you accidently leaked urine? -  Do you have problems with loss of bowel control? -  Managing your Medications? -  Managing your Finances? -  Housekeeping or managing your Housekeeping? -  Some recent data might be hidden   Most recent fall risk assessment: Fall Risk  08/17/2021  Falls in the past year? 0  Number falls in past yr: 0  Comment -  Injury with Fall? 0  Risk for fall due to : No Fall Risks  Follow up Falls evaluation completed    Most recent depression screenings: PHQ 2/9 Scores 08/17/2021 07/15/2021  PHQ - 2 Score 0 0  PHQ- 9 Score 0 2   Most recent cognitive screening: 6CIT Screen 08/17/2021  What Year? 0 points  What month? 0 points  What  time? 0 points  Count back from 20 0 points  Months in reverse 0 points  Repeat phrase 2 points  Total Score 2   Most recent Audit-C alcohol use screening Alcohol Use Disorder Test (AUDIT) 08/17/2021  1. How often do you have a drink containing alcohol? 0  2. How many drinks containing alcohol do you have on a typical day when you are drinking? 0  3. How often do you have six or more drinks on one occasion? 0  AUDIT-C Score 0  Alcohol Brief Interventions/Follow-up -   A score of 3 or more in women, and 4 or more in men indicates increased risk for alcohol abuse, EXCEPT if all of the points are from question 1   No results found for any visits on 08/17/21.  Assessment & Plan     Annual wellness visit done today including the all of the following: Reviewed patient's Family Medical History Reviewed and updated list of patient's medical providers Assessment of cognitive impairment was done Assessed patient's functional ability Established a written schedule for health screening services Health Risk Assessent Completed and Reviewed  Exercise Activities and Dietary recommendations  Goals      Exercise 3x per week (30 min per time)     Recommend to start walking 3 days a week for at least 30 minutes at a time.         Immunization History  Administered Date(s) Administered   Fluad Quad(high Dose 65+) 04/29/2019, 07/27/2020, 08/17/2021   Influenza,inj,Quad PF,6+ Mos 06/26/2013, 04/24/2018   PFIZER(Purple Top)SARS-COV-2 Vaccination 04/29/2020, 08/19/2020   PNEUMOCOCCAL CONJUGATE-20 08/17/2021   Pneumococcal Conjugate-13 07/27/2020   Tdap 02/19/2019   Zoster Recombinat (Shingrix) 02/19/2019, 04/29/2019    Health Maintenance  Topic Date Due   COVID-19 Vaccine (3 - Booster  for [place] series) 09/02/2021 (Originally 10/14/2020)   MAMMOGRAM  07/27/2022   COLONOSCOPY (Pts 45-32yrs Insurance coverage will need to be confirmed)  03/26/2024   TETANUS/TDAP  02/18/2029   Pneumonia  Vaccine 46+ Years old  Completed   INFLUENZA VACCINE  Completed   DEXA SCAN  Completed   Hepatitis C Screening  Completed   Zoster Vaccines- Shingrix  Completed   HPV VACCINES  Aged Out     Discussed health benefits of physical activity, and encouraged her to engage in regular exercise appropriate for her age and condition.    Problem List Items Addressed This Visit       Cardiovascular and Mediastinum   Common migraine with intractable migraine    Previously followed by neurology prior to COVID She was well controlled on her Effexor, but has noticed that her triptan is significantly out of date We will refill her triptan today      Relevant Medications   naratriptan (AMERGE) 2.5 MG tablet   venlafaxine (EFFEXOR) 75 MG tablet     Endocrine   Thyroid nodule    Stable and small Recheck TSH Continue annual ultrasound x5 years for stability      Relevant Orders   TSH   US THYROID     Other   Hypercholesteremia    Reviewed last lipid panel Not currently on a statin Recheck FLP and CMP Discussed diet and exercise       Relevant Orders   Comprehensive metabolic panel   Lipid Panel With LDL/HDL Ratio   Anxiety and depression    Chronic and well-controlled Continue Effexor at current dose Encourage therapy      Relevant Medications   venlafaxine (EFFEXOR) 75 MG tablet   Subareolar mass of left breast    History of lumpectomy with benign pathology New Sub-areolar mass palpable on exam No other changes Repeat diagnostic mammogram and left breast ultrasound She will also consider following up with her breast surgeon and consideration of breast MRI per her recommendation      Relevant Orders   MM DIAG BREAST TOMO BILATERAL   US BREAST COMPLETE UNI LEFT INC AXILLA   Other Visit Diagnoses     Encounter for annual wellness visit (AWV) in Medicare patient    -  Primary   Hyperglycemia       Relevant Orders   Hemoglobin A1c   Need for influenza vaccination        Relevant Orders   Flu Vaccine QUAD High Dose(Fluad) (Completed)   Need for pneumococcal vaccination       Relevant Orders   Pneumococcal conjugate vaccine 20-valent (Completed)        Return in about 1 year (around 08/17/2022) for AWV.     I, Lavon Paganini, MD, have reviewed all documentation for this visit. The documentation on 08/17/21 for the exam, diagnosis, procedures, and orders are all accurate and complete.   Anijah Spohr, Dionne Bucy, MD, MPH [facility]

## 2021-08-17 ENCOUNTER — Ambulatory Visit (INDEPENDENT_AMBULATORY_CARE_PROVIDER_SITE_OTHER): Payer: Medicare Other | Admitting: Family Medicine

## 2021-08-17 ENCOUNTER — Encounter: Payer: Self-pay | Admitting: Family Medicine

## 2021-08-17 ENCOUNTER — Other Ambulatory Visit: Payer: Self-pay | Admitting: Family Medicine

## 2021-08-17 ENCOUNTER — Telehealth: Payer: Self-pay

## 2021-08-17 ENCOUNTER — Other Ambulatory Visit: Payer: Self-pay

## 2021-08-17 VITALS — BP 119/74 | HR 72 | Temp 98.4°F | Resp 16 | Ht 64.0 in | Wt 209.6 lb

## 2021-08-17 DIAGNOSIS — E041 Nontoxic single thyroid nodule: Secondary | ICD-10-CM

## 2021-08-17 DIAGNOSIS — F419 Anxiety disorder, unspecified: Secondary | ICD-10-CM

## 2021-08-17 DIAGNOSIS — Z23 Encounter for immunization: Secondary | ICD-10-CM

## 2021-08-17 DIAGNOSIS — R739 Hyperglycemia, unspecified: Secondary | ICD-10-CM | POA: Diagnosis not present

## 2021-08-17 DIAGNOSIS — E7800 Pure hypercholesterolemia, unspecified: Secondary | ICD-10-CM

## 2021-08-17 DIAGNOSIS — N6342 Unspecified lump in left breast, subareolar: Secondary | ICD-10-CM

## 2021-08-17 DIAGNOSIS — F32A Depression, unspecified: Secondary | ICD-10-CM

## 2021-08-17 DIAGNOSIS — N6341 Unspecified lump in right breast, subareolar: Secondary | ICD-10-CM | POA: Insufficient documentation

## 2021-08-17 DIAGNOSIS — G43019 Migraine without aura, intractable, without status migrainosus: Secondary | ICD-10-CM | POA: Diagnosis not present

## 2021-08-17 DIAGNOSIS — Z0000 Encounter for general adult medical examination without abnormal findings: Secondary | ICD-10-CM | POA: Diagnosis not present

## 2021-08-17 MED ORDER — VENLAFAXINE HCL 75 MG PO TABS: 75.0000 mg | ORAL_TABLET | Freq: Every day | ORAL | 1 refills | Status: DC

## 2021-08-17 MED ORDER — NARATRIPTAN HCL 2.5 MG PO TABS: 2.5000 mg | ORAL_TABLET | ORAL | 1 refills | Status: DC | PRN

## 2021-08-17 MED ORDER — POTASSIUM CHLORIDE CRYS ER 20 MEQ PO TBCR: 20.0000 meq | EXTENDED_RELEASE_TABLET | Freq: Every day | ORAL | 3 refills | Status: DC

## 2021-08-17 NOTE — Addendum Note (Signed)
Addended by: Shawna Orleans on: 08/17/2021 04:10 PM   Modules accepted: Orders

## 2021-08-17 NOTE — Assessment & Plan Note (Signed)
History of lumpectomy with benign pathology New Sub-areolar mass palpable on exam No other changes Repeat diagnostic mammogram and left breast ultrasound She will also consider following up with her breast surgeon and consideration of breast MRI per her recommendation

## 2021-08-17 NOTE — Assessment & Plan Note (Signed)
Chronic and well-controlled Continue Effexor at current dose Encourage therapy

## 2021-08-17 NOTE — Telephone Encounter (Signed)
Please ask patient if she has tried sumatriptan before. If not, we can switch due to insurance requirements and send in Sumatriptan 50 mg q2h prn migraine #10 r2

## 2021-08-17 NOTE — Assessment & Plan Note (Signed)
Stable and small Recheck TSH Continue annual ultrasound x5 years for stability

## 2021-08-17 NOTE — Telephone Encounter (Signed)
Mammogram order states right breast lump and the ultrasound order states left breast lump.Can you fix the orders? Thanks

## 2021-08-17 NOTE — Telephone Encounter (Signed)
Patient reports she has not tried sumatriptan.

## 2021-08-17 NOTE — Assessment & Plan Note (Signed)
Reviewed last lipid panel Not currently on a statin Recheck FLP and CMP Discussed diet and exercise  

## 2021-08-17 NOTE — Addendum Note (Signed)
Addended by: Virginia Crews on: 08/17/2021 12:14 PM   Modules accepted: Orders

## 2021-08-17 NOTE — Assessment & Plan Note (Signed)
Previously followed by neurology prior to [place] She was well controlled on her Effexor, but has noticed that her triptan is significantly out of date We will refill her triptan today

## 2021-08-17 NOTE — Telephone Encounter (Signed)
Orders corrected.  Copied from [place] [id number]. Topic: General - Other >> Aug 17, 2021 11:54 AM Parke Poisson wrote: Reason for CRM: Breast ultrasound are always limited not complete per [place]. Can someone correct order,Thanks

## 2021-08-18 ENCOUNTER — Telehealth: Payer: Self-pay

## 2021-08-18 DIAGNOSIS — N6342 Unspecified lump in left breast, subareolar: Secondary | ICD-10-CM

## 2021-08-18 LAB — COMPREHENSIVE METABOLIC PANEL
ALT: 13 IU/L (ref 0–32)
AST: 17 IU/L (ref 0–40)
Albumin/Globulin Ratio: 2 (ref 1.2–2.2)
Albumin: 4.5 g/dL (ref 3.8–4.8)
Alkaline Phosphatase: 111 IU/L (ref 44–121)
BUN/Creatinine Ratio: 20 (ref 12–28)
BUN: 14 mg/dL (ref 8–27)
Bilirubin Total: 0.4 mg/dL (ref 0.0–1.2)
CO2: 27 mmol/L (ref 20–29)
Calcium: 9.7 mg/dL (ref 8.7–10.3)
Chloride: 104 mmol/L (ref 96–106)
Creatinine, Ser: 0.7 mg/dL (ref 0.57–1.00)
Globulin, Total: 2.2 g/dL (ref 1.5–4.5)
Glucose: 91 mg/dL (ref 70–99)
Potassium: 4.1 mmol/L (ref 3.5–5.2)
Sodium: 143 mmol/L (ref 134–144)
Total Protein: 6.7 g/dL (ref 6.0–8.5)
eGFR: 95 mL/min/{1.73_m2} (ref >59)

## 2021-08-18 LAB — TSH: TSH: 2.62 u[IU]/mL (ref 0.450–4.500)

## 2021-08-18 LAB — LIPID PANEL WITH LDL/HDL RATIO
Cholesterol, Total: 227 mg/dL — ABNORMAL HIGH (ref 100–199)
HDL: 60 mg/dL (ref >39)
LDL Chol Calc (NIH): 146 mg/dL — ABNORMAL HIGH (ref 0–99)
LDL/HDL Ratio: 2.4 ratio (ref 0.0–3.2)
Triglycerides: 116 mg/dL (ref 0–149)
VLDL Cholesterol Cal: 21 mg/dL (ref 5–40)

## 2021-08-18 LAB — HEMOGLOBIN A1C
Est. average glucose Bld gHb Est-mCnc: 97 mg/dL
Hgb A1c MFr Bld: 5 % (ref 4.8–5.6)

## 2021-08-18 NOTE — Telephone Encounter (Signed)
FYI-Per Parke Poisson patient has not completed her mammogram yet. She will need to have this done first. Patient was advised.

## 2021-08-18 NOTE — Telephone Encounter (Signed)
Copied from [place] [id number]. Topic: Appointment Scheduling - Scheduling Inquiry for Clinic >> Aug 18, 2021 12:04 PM McGill, Nelva Bush wrote: Reason for CRM: Pt was unable to make make an appointment with [facility], for the lumpectomy. Was advised PCP needs to call . Pt stated she was advised by PCP to call us if she had any issues making this appointment.  Pt requested to see Faera L. Barry Dienes, MD, FACS on a Monday/Tuesday 10 AM- after.

## 2021-08-19 NOTE — Telephone Encounter (Signed)
Patient has seen Dr Barry Dienes previously for a lumpectomy. This was for f/u, not the new lump that needs the mammo and [place]. Go ahead and place referral please. Ok to associate with the diagnosis for the mammo and write in the comments that this is for f/u.

## 2021-08-19 NOTE — Addendum Note (Signed)
Addended by: Doristine Devoid on: 08/19/2021 11:29 AM   Modules accepted: Orders

## 2021-08-19 NOTE — Telephone Encounter (Signed)
Referral placed.

## 2021-08-24 ENCOUNTER — Other Ambulatory Visit: Payer: Self-pay

## 2021-08-24 ENCOUNTER — Ambulatory Visit
Admission: RE | Admit: 2021-08-24 | Discharge: 2021-08-24 | Disposition: A | Discharge status: Home / Self Care | Payer: Medicare Other | Source: Ambulatory Visit | Attending: Family Medicine | Admitting: Family Medicine

## 2021-08-24 DIAGNOSIS — E041 Nontoxic single thyroid nodule: Secondary | ICD-10-CM

## 2021-08-25 ENCOUNTER — Ambulatory Visit
Admission: RE | Admit: 2021-08-25 | Discharge: 2021-08-25 | Disposition: A | Discharge status: Home / Self Care | Payer: Medicare Other | Source: Ambulatory Visit | Attending: Family Medicine | Admitting: Family Medicine

## 2021-08-25 DIAGNOSIS — N6342 Unspecified lump in left breast, subareolar: Secondary | ICD-10-CM | POA: Insufficient documentation

## 2021-08-26 ENCOUNTER — Telehealth: Payer: Self-pay

## 2021-08-26 ENCOUNTER — Other Ambulatory Visit: Payer: Self-pay | Admitting: Family Medicine

## 2021-08-26 DIAGNOSIS — E041 Nontoxic single thyroid nodule: Secondary | ICD-10-CM

## 2021-08-26 DIAGNOSIS — R928 Other abnormal and inconclusive findings on diagnostic imaging of breast: Secondary | ICD-10-CM

## 2021-08-26 DIAGNOSIS — R921 Mammographic calcification found on diagnostic imaging of breast: Secondary | ICD-10-CM

## 2021-08-26 NOTE — Telephone Encounter (Signed)
-----   Message from Virginia Crews, MD sent at 08/25/2021  9:02 AM EST ----- One nodule has grown very slightly, but can be biopsied. Recommend ENT referral (CMAs ok to place if patient agrees).  The other nodule needs to be watched for another 2 years with annual ultrasounds.

## 2021-08-26 NOTE — Telephone Encounter (Signed)
Patient advised. Agreed to ENT referral. Referral placed.

## 2021-09-14 ENCOUNTER — Ambulatory Visit
Admission: RE | Admit: 2021-09-14 | Discharge: 2021-09-14 | Disposition: A | Discharge status: Home / Self Care | Payer: Medicare Other | Source: Ambulatory Visit | Attending: Family Medicine | Admitting: Family Medicine

## 2021-09-14 ENCOUNTER — Other Ambulatory Visit: Payer: Self-pay

## 2021-09-14 DIAGNOSIS — R928 Other abnormal and inconclusive findings on diagnostic imaging of breast: Secondary | ICD-10-CM | POA: Diagnosis present

## 2021-09-14 DIAGNOSIS — R921 Mammographic calcification found on diagnostic imaging of breast: Secondary | ICD-10-CM

## 2021-09-14 HISTORY — PX: BREAST BIOPSY: SHX20

## 2021-09-15 LAB — SURGICAL PATHOLOGY

## 2021-09-16 ENCOUNTER — Other Ambulatory Visit: Payer: Self-pay | Admitting: Otolaryngology

## 2021-09-16 DIAGNOSIS — E041 Nontoxic single thyroid nodule: Secondary | ICD-10-CM

## 2021-09-21 ENCOUNTER — Ambulatory Visit
Admission: RE | Admit: 2021-09-21 | Discharge: 2021-09-21 | Disposition: A | Discharge status: Home / Self Care | Payer: Medicare Other | Source: Ambulatory Visit | Attending: Otolaryngology | Admitting: Otolaryngology

## 2021-09-21 ENCOUNTER — Other Ambulatory Visit: Payer: Self-pay

## 2021-09-21 DIAGNOSIS — E041 Nontoxic single thyroid nodule: Secondary | ICD-10-CM | POA: Insufficient documentation

## 2021-10-19 ENCOUNTER — Encounter: Payer: Self-pay | Admitting: Otolaryngology

## 2021-10-19 LAB — CYTOLOGY - NON PAP

## 2021-10-22 ENCOUNTER — Ambulatory Visit: Payer: Self-pay

## 2021-10-22 NOTE — Telephone Encounter (Signed)
?  Chief Complaint: Fatigue  ?Symptoms: Legs feel heavy - going to bed early ?Frequency: 1 -2 weeks ?Pertinent Negatives: Patient denies SOB, fever chest pain  ?Disposition: '[]'$ ED /'[]'$ Urgent Care (no appt availability in office) / '[x]'$ Appointment(In office/virtual)/ '[]'$  [place] [facility]/ '[]'$ Home Care/ '[]'$ Refused Recommended Disposition /'[]'$ [place] [facility]/ '[]'$  Follow-up with PCP ?Additional Notes: Pt states that she just feels fatigue. Legs feel heavy - "something is just not quite right".  ?Reason for Disposition ? [1] Fatigue (i.e., tires easily, decreased energy) AND [2] persists > 1 week ? ?Answer Assessment - Initial Assessment Questions ?1. DESCRIPTION: "Describe how you are feeling." ?    Tired - going to bed early. Legs feel heavy ?2. SEVERITY: "How bad is it?"  "Can you stand and walk?" ?  - MILD - Feels weak or tired, but does not interfere with work, school or normal activities ?  - MODERATE - Able to stand and walk; weakness interferes with work, school, or normal activities ?  - SEVERE - Unable to stand or walk ?    moderate ?3. ONSET:  "When did the weakness begin?" ?    A couple of weeks ? ?4. CAUSE: "What do you think is causing the weakness?" ?    unsure ?5. MEDICINES: "Have you recently started a new medicine or had a change in the amount of a medicine?" ?    no ?6. OTHER SYMPTOMS: "Do you have any other symptoms?" (e.g., chest pain, fever, cough, SOB, vomiting, diarrhea, bleeding, other areas of pain) ?    no ?7. PREGNANCY: "Is there any chance you are pregnant?" "When was your last menstrual period?" ?    na ? ?Protocols used: Weakness (Generalized) and Fatigue-A-AH ? ?

## 2021-10-22 NOTE — Progress Notes (Signed)
?  ? ?I,Sulibeya S Dimas,acting as a scribe for Lavon Paganini, MD.,have documented all relevant documentation on the behalf of Lavon Paganini, MD,as directed by  Lavon Paganini, MD while in the presence of Lavon Paganini, MD. ? ? ?Established patient visit ? ? ?Patient: Elaine Melendez   DOB: 01-26-1956   66 y.o. Female  MRN: [id number] ?Visit Date: 10/25/2021 ? ?Today's healthcare provider: Lavon Paganini, MD  ? ?Chief Complaint  ?Patient presents with  ? Fatigue  ? ?Subjective  ?  ?HPI  ?Fatigue ? ?She reports new onset fatigue which she describes as a lack of energy, feeling exhausted, feeling sleepy, and feeling weak. It began a few weeks ago and occurs all the time. It is described as severe and gradually worsening. She has not started new medications around the time the fatigue started. Patient reports weakness in legs, and shaking of hands and head. ? ?Associated symptoms: ?No arthralgias No bleeding  ?No melena No chest discomfort  ?Yes heart palpitations No heart racing  ? ?No dyspnea No feeling depressed  ?No feeling anxious or under stress No fevers  ?No loss of appetite Yes nausea  ?No vomiting No sleeping problems  ? ? ?Wt Readings from Last 3 Encounters:  ?10/25/21 213 lb (96.6 kg)  ?08/17/21 209 lb 9.6 oz (95.1 kg)  ?07/15/21 209 lb (94.8 kg)  ?  ?Lab Results  ?Component Value Date  ? WBC 10.5 07/27/2020  ? HGB 15.0 07/27/2020  ? HCT 43.2 07/27/2020  ? MCV 88 07/27/2020  ? PLT 275 07/27/2020  ? ?Lab Results  ?Component Value Date  ? TSH 2.620 08/17/2021  ? Lab Results  ?Component Value Date  ? NA 143 08/17/2021  ? K 4.1 08/17/2021  ? CO2 27 08/17/2021  ? BUN 14 08/17/2021  ? CREATININE 0.70 08/17/2021  ? CALCIUM 9.7 08/17/2021  ? GLUCOSE 91 08/17/2021  ?  ? ? ?Meneires has flared up recently and she takes dramamine prn for this. But this feels worse thatn typical with Meneires  ? ?Occasional palpitations like skipping a beat. ? ?Daughter has noticed some tremors occasionally. ? ?Son  has a fib ?--------------------------------------------------------------------------------------------------- ? ? ?Medications: ?Outpatient Medications Prior to Visit  ?Medication Sig  ? celecoxib (CELEBREX) 200 MG capsule Take 200 mg by mouth 2 (two) times daily as needed.  ? dimenhyDRINATE (DRAMAMINE) 50 MG tablet Take 50 mg by mouth every 8 (eight) hours as needed.  ? fluticasone (FLONASE) 50 MCG/ACT nasal spray Place 2 sprays into both nostrils daily.  ? nystatin (MYCOSTATIN/NYSTOP) powder Apply topically 3 (three) times daily.  ? pantoprazole (PROTONIX) 40 MG tablet Take 1 tablet (40 mg total) by mouth at bedtime.  ? potassium chloride SA (KLOR-CON M20) 20 MEQ tablet Take 1 tablet (20 mEq total) by mouth daily.  ? SUMAtriptan (IMITREX) 50 MG tablet Take 1 tablet (50 mg total) by mouth every 2 (two) hours as needed for migraine.  ? venlafaxine (EFFEXOR) 75 MG tablet Take 1 tablet (75 mg total) by mouth daily.  ? [DISCONTINUED] guaiFENesin-codeine 100-10 MG/5ML syrup Take 5 mLs by mouth at bedtime. As needed. Do not take with other OTC cough suppressants. (Patient not taking: Reported on 10/25/2021)  ? ?No facility-administered medications prior to visit.  ? ? ?Review of Systems  ?Constitutional:  Positive for activity change and fatigue.  ?Respiratory:  Negative for chest tightness and shortness of breath.   ?Cardiovascular:  Positive for palpitations. Negative for chest pain.  ?Gastrointestinal:  Positive for abdominal distention, diarrhea  and nausea. Negative for blood in stool, constipation and vomiting.  ?Neurological:  Positive for dizziness, tremors and weakness.  ? ?Last CBC ?Lab Results  ?Component Value Date  ? WBC 10.5 07/27/2020  ? HGB 15.0 07/27/2020  ? HCT 43.2 07/27/2020  ? MCV 88 07/27/2020  ? MCH 30.6 07/27/2020  ? RDW 12.1 07/27/2020  ? PLT 275 07/27/2020  ? ?Last metabolic panel ?Lab Results  ?Component Value Date  ? GLUCOSE 91 08/17/2021  ? NA 143 08/17/2021  ? K 4.1 08/17/2021  ? CL 104  08/17/2021  ? CO2 27 08/17/2021  ? BUN 14 08/17/2021  ? CREATININE 0.70 08/17/2021  ? EGFR 95 08/17/2021  ? CALCIUM 9.7 08/17/2021  ? PROT 6.7 08/17/2021  ? ALBUMIN 4.5 08/17/2021  ? LABGLOB 2.2 08/17/2021  ? AGRATIO 2.0 08/17/2021  ? BILITOT 0.4 08/17/2021  ? ALKPHOS 111 08/17/2021  ? AST 17 08/17/2021  ? ALT 13 08/17/2021  ? ANIONGAP 14 02/02/2018  ? ?Last lipids ?Lab Results  ?Component Value Date  ? CHOL 227 (H) 08/17/2021  ? HDL 60 08/17/2021  ? LDLCALC 146 (H) 08/17/2021  ? TRIG 116 08/17/2021  ? CHOLHDL 4.1 07/27/2020  ? ?Last thyroid functions ?Lab Results  ?Component Value Date  ? TSH 2.620 08/17/2021  ? T3TOTAL 138 04/11/2018  ? T4TOTAL 10.0 04/11/2018  ? ?  ?  Objective  ?  ?BP 137/89 (BP Location: Left Arm, Patient Position: Sitting, Cuff Size: Large)   Pulse 91   Temp (!) 97.3 ?F (36.3 ?C) (Temporal)   Resp 16   Wt 213 lb (96.6 kg)   LMP  (LMP Unknown)   BMI 36.56 kg/m?  ?BP Readings from Last 3 Encounters:  ?10/25/21 137/89  ?09/21/21 131/72  ?08/17/21 119/74  ? ?Wt Readings from Last 3 Encounters:  ?10/25/21 213 lb (96.6 kg)  ?08/17/21 209 lb 9.6 oz (95.1 kg)  ?07/15/21 209 lb (94.8 kg)  ? ?  ? ?Physical Exam ?Vitals reviewed.  ?Constitutional:   ?   General: She is not in acute distress. ?   Appearance: Normal appearance. She is well-developed. She is not diaphoretic.  ?HENT:  ?   Head: Normocephalic and atraumatic.  ?Eyes:  ?   General: No scleral icterus. ?   Conjunctiva/sclera: Conjunctivae normal.  ?Neck:  ?   Thyroid: No thyromegaly.  ?Cardiovascular:  ?   Rate and Rhythm: Normal rate and regular rhythm.  ?   Pulses: Normal pulses.  ?   Heart sounds: Normal heart sounds. No murmur heard. ?Pulmonary:  ?   Effort: Pulmonary effort is normal. No respiratory distress.  ?   Breath sounds: Normal breath sounds. No wheezing, rhonchi or rales.  ?Musculoskeletal:  ?   Cervical back: Neck supple.  ?   Right lower leg: No edema.  ?   Left lower leg: No edema.  ?Lymphadenopathy:  ?   Cervical: No  cervical adenopathy.  ?Skin: ?   General: Skin is warm and dry.  ?   Findings: No rash.  ?Neurological:  ?   Mental Status: She is alert and oriented to person, place, and time. Mental status is at baseline.  ?Psychiatric:     ?   Mood and Affect: Mood normal.     ?   Behavior: Behavior normal.  ?  ? ? ?No results found for any visits on 10/25/21. ? Assessment & Plan  ?  ? ?Problem List Items Addressed This Visit   ? ?  ? Other  ?  Fatigue - Primary  ?  New problem ?Likely multifactorial ?M?ni?re's disease and the Dramamine that she takes for this may be contributing ?Check labs for any underlying causes, including vitamin D, B12, TSH, CBC, CMP ?As above, if she does have A-fib that is causing her palpitations, this could contribute to fatigue as well ?Further management pending results ?Consider sleep study in the future if other evaluation is benign as she does have some nonrestorative sleep and does snore ?  ?  ? Relevant Orders  ? TSH  ? VITAMIN D 25 Hydroxy (Vit-D Deficiency, Fractures)  ? B12  ? Comprehensive metabolic panel  ? CBC w/Diff/Platelet  ? Palpitations  ?  New problem ?Intermittent, short-lived palpitations ?In normal sinus rhythm today ?She does have a family history of A-fib, which would also contribute to her fatigue as below ?No EKG today given that she is currently in normal rhythm and her palpitations are fairly infrequent ?Check labs for any underlying causes, including CBC, CMP, TSH ?Zio patch x14 days ?Further evaluation/management pending results ?  ?  ? Relevant Orders  ? LONG TERM MONITOR (3-14 DAYS)  ? TSH  ? VITAMIN D 25 Hydroxy (Vit-D Deficiency, Fractures)  ? B12  ? Comprehensive metabolic panel  ? CBC w/Diff/Platelet  ? ?Other Visit Diagnoses   ? ? Body mass index (BMI) 36.0-36.9, adult      ? Relevant Orders  ? VITAMIN D 25 Hydroxy (Vit-D Deficiency, Fractures)  ? ?  ?  ? ?Return if symptoms worsen or fail to improve.  ?   ? ?I, Lavon Paganini, MD, have reviewed all documentation  for this visit. The documentation on 10/25/21 for the exam, diagnosis, procedures, and orders are all accurate and complete. ? ? ?Virginia Crews, MD, MPH ?[facility]

## 2021-10-25 ENCOUNTER — Encounter: Payer: Self-pay | Admitting: Family Medicine

## 2021-10-25 ENCOUNTER — Ambulatory Visit (INDEPENDENT_AMBULATORY_CARE_PROVIDER_SITE_OTHER): Payer: Medicare Other | Admitting: Family Medicine

## 2021-10-25 ENCOUNTER — Inpatient Hospital Stay: Payer: Medicare Other

## 2021-10-25 ENCOUNTER — Other Ambulatory Visit: Payer: Self-pay

## 2021-10-25 VITALS — BP 137/89 | HR 91 | Temp 97.3°F | Resp 16 | Wt 213.0 lb

## 2021-10-25 DIAGNOSIS — R5383 Other fatigue: Secondary | ICD-10-CM

## 2021-10-25 DIAGNOSIS — Z6836 Body mass index (BMI) 36.0-36.9, adult: Secondary | ICD-10-CM | POA: Diagnosis not present

## 2021-10-25 DIAGNOSIS — R002 Palpitations: Secondary | ICD-10-CM

## 2021-10-25 NOTE — Assessment & Plan Note (Addendum)
New problem ?Likely multifactorial ?M?ni?re's disease and the Dramamine that she takes for this may be contributing ?Check labs for any underlying causes, including vitamin D, B12, TSH, CBC, CMP ?As above, if she does have A-fib that is causing her palpitations, this could contribute to fatigue as well ?Further management pending results ?Consider sleep study in the future if other evaluation is benign as she does have some nonrestorative sleep and does snore ?

## 2021-10-25 NOTE — Assessment & Plan Note (Signed)
New problem ?Intermittent, short-lived palpitations ?In normal sinus rhythm today ?She does have a family history of A-fib, which would also contribute to her fatigue as below ?No EKG today given that she is currently in normal rhythm and her palpitations are fairly infrequent ?Check labs for any underlying causes, including CBC, CMP, TSH ?Zio patch x14 days ?Further evaluation/management pending results ?

## 2021-10-26 ENCOUNTER — Telehealth: Payer: Self-pay

## 2021-10-26 LAB — CBC WITH DIFFERENTIAL/PLATELET
Basophils Absolute: 0.1 10*3/uL (ref 0.0–0.2)
Basos: 1 %
EOS (ABSOLUTE): 1 10*3/uL — ABNORMAL HIGH (ref 0.0–0.4)
Eos: 9 %
Hematocrit: 43.3 % (ref 34.0–46.6)
Hemoglobin: 14.8 g/dL (ref 11.1–15.9)
Immature Grans (Abs): 0 10*3/uL (ref 0.0–0.1)
Immature Granulocytes: 0 %
Lymphocytes Absolute: 4.3 10*3/uL — ABNORMAL HIGH (ref 0.7–3.1)
Lymphs: 42 %
MCH: 29.2 pg (ref 26.6–33.0)
MCHC: 34.2 g/dL (ref 31.5–35.7)
MCV: 86 fL (ref 79–97)
Monocytes Absolute: 0.7 10*3/uL (ref 0.1–0.9)
Monocytes: 7 %
Neutrophils Absolute: 4.3 10*3/uL (ref 1.4–7.0)
Neutrophils: 41 %
Platelets: 260 10*3/uL (ref 150–450)
RBC: 5.06 x10E6/uL (ref 3.77–5.28)
RDW: 11.7 % (ref 11.7–15.4)
WBC: 10.4 10*3/uL (ref 3.4–10.8)

## 2021-10-26 LAB — COMPREHENSIVE METABOLIC PANEL
ALT: 9 IU/L (ref 0–32)
AST: 9 IU/L (ref 0–40)
Albumin/Globulin Ratio: 2 (ref 1.2–2.2)
Albumin: 4.3 g/dL (ref 3.8–4.8)
Alkaline Phosphatase: 100 IU/L (ref 44–121)
BUN/Creatinine Ratio: 16 (ref 12–28)
BUN: 12 mg/dL (ref 8–27)
Bilirubin Total: 0.2 mg/dL (ref 0.0–1.2)
CO2: 23 mmol/L (ref 20–29)
Calcium: 9.5 mg/dL (ref 8.7–10.3)
Chloride: 105 mmol/L (ref 96–106)
Creatinine, Ser: 0.73 mg/dL (ref 0.57–1.00)
Globulin, Total: 2.1 g/dL (ref 1.5–4.5)
Glucose: 98 mg/dL (ref 70–99)
Potassium: 3.8 mmol/L (ref 3.5–5.2)
Sodium: 143 mmol/L (ref 134–144)
Total Protein: 6.4 g/dL (ref 6.0–8.5)
eGFR: 90 mL/min/{1.73_m2} (ref >59)

## 2021-10-26 LAB — VITAMIN D 25 HYDROXY (VIT D DEFICIENCY, FRACTURES): Vit D, 25-Hydroxy: 8.3 ng/mL — ABNORMAL LOW (ref 30.0–100.0)

## 2021-10-26 LAB — VITAMIN B12: Vitamin B-12: 381 pg/mL (ref 232–1245)

## 2021-10-26 LAB — TSH: TSH: 2.32 u[IU]/mL (ref 0.450–4.500)

## 2021-10-26 MED ORDER — VITAMIN D (ERGOCALCIFEROL) 1.25 MG (50000 UNIT) PO CAPS: 50000.0000 [IU] | ORAL_CAPSULE | ORAL | 0 refills | Status: DC

## 2021-10-26 NOTE — Telephone Encounter (Signed)
-----   Message from Virginia Crews, MD sent at 10/26/2021  8:03 AM EDT ----- ?Normal/stable labs, except Vit D is quite low. This can explain the fatigue.  Recommend high dose weekly Vit D x12 weeks and then recheck Vit D level.  CMAs please send in Rx for Vit D. ?

## 2021-11-18 ENCOUNTER — Telehealth: Payer: Self-pay | Admitting: Gastroenterology

## 2021-11-18 NOTE — Telephone Encounter (Signed)
Patient left vm stating that [facility] did not refill her pantoprazole. Requesting a call from nurse in reference to medication refill. ?

## 2021-11-22 MED ORDER — PANTOPRAZOLE SODIUM 40 MG PO TBEC: 40.0000 mg | DELAYED_RELEASE_TABLET | Freq: Every day | ORAL | 2 refills | Status: DC

## 2021-11-22 NOTE — Telephone Encounter (Signed)
Rx sent through e-scribe ? ?Pt is aware that she will be due for a f/u after 02/09/22, she will call back to schedule  ?

## 2021-11-22 NOTE — Addendum Note (Signed)
Addended by: Lurlean Nanny on: 11/22/2021 09:09 AM ? ? Modules accepted: Orders ? ?

## 2022-01-10 ENCOUNTER — Other Ambulatory Visit: Payer: Self-pay | Admitting: Family Medicine

## 2022-02-27 ENCOUNTER — Other Ambulatory Visit: Payer: Self-pay | Admitting: Gastroenterology

## 2022-04-20 ENCOUNTER — Telehealth: Payer: Self-pay

## 2022-04-20 DIAGNOSIS — E041 Nontoxic single thyroid nodule: Secondary | ICD-10-CM

## 2022-04-20 NOTE — Telephone Encounter (Signed)
Copied from [place] [phone]. Topic: Referral - Request for Referral >> Apr 20, 2022  4:06 PM Everette C wrote: Has patient seen PCP for this complaint? Yes.   *If NO, is insurance requiring patient see PCP for this issue before PCP can refer them? Referral for which specialty: Endocrinology  Preferred provider/office: Patient has no preference  Reason for referral: Thyroid concerns

## 2022-04-20 NOTE — Telephone Encounter (Signed)
Please Review

## 2022-04-21 NOTE — Telephone Encounter (Signed)
Patient reports no current symptoms. She just would like to be establish with Endocrine to have her thyroid monitor since she has a nodule.

## 2022-04-21 NOTE — Addendum Note (Signed)
Addended by: Eulis Foster L on: 04/21/2022 03:00 PM   Modules accepted: Orders

## 2022-04-21 NOTE — Telephone Encounter (Signed)
Endocrinology referral submitted  Eulis Foster, MD  [facility]  [phone]

## 2022-05-23 ENCOUNTER — Other Ambulatory Visit: Payer: Self-pay | Admitting: Family Medicine

## 2022-06-02 ENCOUNTER — Other Ambulatory Visit: Payer: Self-pay | Admitting: Gastroenterology

## 2022-08-22 ENCOUNTER — Encounter: Payer: Self-pay | Admitting: Family Medicine

## 2022-08-22 ENCOUNTER — Ambulatory Visit (INDEPENDENT_AMBULATORY_CARE_PROVIDER_SITE_OTHER): Payer: Medicare Other | Admitting: Family Medicine

## 2022-08-22 VITALS — BP 132/80 | HR 81 | Temp 97.9°F | Resp 16 | Ht 64.0 in | Wt 220.0 lb

## 2022-08-22 DIAGNOSIS — E7800 Pure hypercholesterolemia, unspecified: Secondary | ICD-10-CM | POA: Diagnosis not present

## 2022-08-22 DIAGNOSIS — F32A Depression, unspecified: Secondary | ICD-10-CM

## 2022-08-22 DIAGNOSIS — Z23 Encounter for immunization: Secondary | ICD-10-CM

## 2022-08-22 DIAGNOSIS — E559 Vitamin D deficiency, unspecified: Secondary | ICD-10-CM | POA: Diagnosis not present

## 2022-08-22 DIAGNOSIS — Z1231 Encounter for screening mammogram for malignant neoplasm of breast: Secondary | ICD-10-CM

## 2022-08-22 DIAGNOSIS — I890 Lymphedema, not elsewhere classified: Secondary | ICD-10-CM | POA: Insufficient documentation

## 2022-08-22 DIAGNOSIS — Z0000 Encounter for general adult medical examination without abnormal findings: Secondary | ICD-10-CM | POA: Diagnosis not present

## 2022-08-22 DIAGNOSIS — F418 Other specified anxiety disorders: Secondary | ICD-10-CM

## 2022-08-22 DIAGNOSIS — E041 Nontoxic single thyroid nodule: Secondary | ICD-10-CM | POA: Diagnosis not present

## 2022-08-22 DIAGNOSIS — R0789 Other chest pain: Secondary | ICD-10-CM

## 2022-08-22 MED ORDER — VENLAFAXINE HCL 75 MG PO TABS: 75.0000 mg | ORAL_TABLET | Freq: Every day | ORAL | 1 refills | Status: DC

## 2022-08-22 MED ORDER — POTASSIUM CHLORIDE CRYS ER 20 MEQ PO TBCR: 20.0000 meq | EXTENDED_RELEASE_TABLET | Freq: Every day | ORAL | 3 refills | Status: DC

## 2022-08-22 NOTE — Assessment & Plan Note (Signed)
Chronic and well controlled Continue effexor

## 2022-08-22 NOTE — Assessment & Plan Note (Signed)
Noted to have non-pitting swelling in b/l legs Recommend compression stockings

## 2022-08-22 NOTE — Addendum Note (Signed)
Addended by: Shawna Orleans on: 08/22/2022 11:59 AM   Modules accepted: Orders

## 2022-08-22 NOTE — Progress Notes (Signed)
I,Sulibeya S Dimas,acting as a Education administrator for Lavon Paganini, MD.,have documented all relevant documentation on the behalf of Lavon Paganini, MD,as directed by  Lavon Paganini, MD while in the presence of Lavon Paganini, MD.   Annual Wellness Visit     Patient: Elaine Melendez, Female    DOB: Sep 04, 1955, 67 y.o.   MRN: [id number] Visit Date: 08/22/2022  Today's Provider: Lavon Paganini, MD   Chief Complaint  Patient presents with   Medicare Wellness   Subjective    Elaine Melendez is a 67 y.o. female who presents today for her Annual Wellness Visit. She reports consuming a general diet. The patient does not participate in regular exercise at present. She generally feels fairly well. She reports sleeping well. She does not have additional problems to discuss today.   HPI  Had sharp stabbing intermittent chest pain, very short lived x4 hours last week. None since then. No assoc SOB or diaphoresis. Non exertional.  Medications: Outpatient Medications Prior to Visit  Medication Sig   celecoxib (CELEBREX) 200 MG capsule Take 200 mg by mouth 2 (two) times daily as needed.   cholecalciferol (VITAMIN D3) 25 MCG (1000 UNIT) tablet Take 1,000 Units by mouth daily.   dimenhyDRINATE (DRAMAMINE) 50 MG tablet Take 50 mg by mouth every 8 (eight) hours as needed.   nystatin (MYCOSTATIN/NYSTOP) powder Apply topically 3 (three) times daily.   pantoprazole (PROTONIX) 40 MG tablet Take 1 tablet (40 mg total) by mouth at bedtime.   SUMAtriptan (IMITREX) 50 MG tablet Take 1 tablet (50 mg total) by mouth every 2 (two) hours as needed for migraine.   [DISCONTINUED] potassium chloride SA (KLOR-CON M20) 20 MEQ tablet Take 1 tablet (20 mEq total) by mouth daily.   [DISCONTINUED] venlafaxine (EFFEXOR) 75 MG tablet TAKE 1 TABLET BY MOUTH EVERY DAY   [DISCONTINUED] fluticasone (FLONASE) 50 MCG/ACT nasal spray Place 2 sprays into both nostrils daily. (Patient not taking: Reported on 08/22/2022)    [DISCONTINUED] Vitamin D, Ergocalciferol, (DRISDOL) 1.25 MG (50000 UNIT) CAPS capsule Take 1 capsule (50,000 Units total) by mouth every 7 (seven) days. (Patient not taking: Reported on 08/22/2022)   No facility-administered medications prior to visit.    Allergies  Allergen Reactions   Nickel Rash    Patient Care Team: Virginia Crews, MD as PCP - General (Family Medicine) Lorelee Cover., MD (Ophthalmology) Stark Klein, MD as Consulting Physician (General Surgery) Thornton Park, MD as Referring Physician (Orthopedic Surgery) Lovell Sheehan, MD as Consulting Physician (Orthopedic Surgery) Virgel Manifold, MD (Inactive) as Consulting Physician (Gastroenterology) Nestor Lewandowsky, MD (Inactive) as Referring Physician (Cardiothoracic Surgery) Orie Rout, MD as Referring Physician (Specialist)  Review of Systems  Constitutional:  Positive for diaphoresis and fatigue.  HENT:  Positive for rhinorrhea.   Cardiovascular:  Positive for leg swelling.  Gastrointestinal:  Positive for nausea.  Endocrine: Positive for heat intolerance.  Musculoskeletal:  Positive for arthralgias and back pain.  Neurological:  Positive for dizziness.  All other systems reviewed and are negative.   Last CBC Lab Results  Component Value Date   WBC 10.4 10/25/2021   HGB 14.8 10/25/2021   HCT 43.3 10/25/2021   MCV 86 10/25/2021   MCH 29.2 10/25/2021   RDW 11.7 10/25/2021   PLT 260 40/98/1191   Last metabolic panel Lab Results  Component Value Date   GLUCOSE 98 10/25/2021   NA 143 10/25/2021   K 3.8 10/25/2021   CL 105 10/25/2021   CO2 23 10/25/2021  BUN 12 10/25/2021   CREATININE 0.73 10/25/2021   EGFR 90 10/25/2021   CALCIUM 9.5 10/25/2021   PROT 6.4 10/25/2021   ALBUMIN 4.3 10/25/2021   LABGLOB 2.1 10/25/2021   AGRATIO 2.0 10/25/2021   BILITOT 0.2 10/25/2021   ALKPHOS 100 10/25/2021   AST 9 10/25/2021   ALT 9 10/25/2021   ANIONGAP 14 02/02/2018   Last lipids Lab  Results  Component Value Date   CHOL 227 (H) 08/17/2021   HDL 60 08/17/2021   LDLCALC 146 (H) 08/17/2021   TRIG 116 08/17/2021   CHOLHDL 4.1 07/27/2020   Last hemoglobin A1c Lab Results  Component Value Date   HGBA1C 5.0 08/17/2021   Last thyroid functions Lab Results  Component Value Date   TSH 2.320 10/25/2021   T3TOTAL 138 04/11/2018   T4TOTAL 10.0 04/11/2018   Last vitamin D Lab Results  Component Value Date   VD25OH 8.3 (L) 10/25/2021   Last vitamin B12 and Folate Lab Results  Component Value Date   VITAMINB12 381 10/25/2021        Objective    Vitals: BP 132/80 (BP Location: Right Arm, Patient Position: Sitting, Cuff Size: Large)   Pulse 81   Temp 97.9 F (36.6 C) (Oral)   Resp 16   Ht '5\' 4"'$  (1.626 m)   Wt 220 lb (99.8 kg)   LMP  (LMP Unknown)   SpO2 100%   BMI 37.76 kg/m  BP Readings from Last 3 Encounters:  08/22/22 132/80  10/25/21 137/89  09/21/21 131/72   Wt Readings from Last 3 Encounters:  08/22/22 220 lb (99.8 kg)  10/25/21 213 lb (96.6 kg)  08/17/21 209 lb 9.6 oz (95.1 kg)      Physical Exam Vitals reviewed.  Constitutional:      General: She is not in acute distress.    Appearance: Normal appearance. She is well-developed. She is not diaphoretic.  HENT:     Head: Normocephalic and atraumatic.     Right Ear: Tympanic membrane, ear canal and external ear normal.     Left Ear: Tympanic membrane, ear canal and external ear normal.     Nose: Nose normal.     Mouth/Throat:     Mouth: Mucous membranes are moist.     Pharynx: Oropharynx is clear. No oropharyngeal exudate.  Eyes:     General: No scleral icterus.    Conjunctiva/sclera: Conjunctivae normal.     Pupils: Pupils are equal, round, and reactive to light.  Neck:     Thyroid: No thyromegaly.  Cardiovascular:     Rate and Rhythm: Normal rate and regular rhythm.     Pulses: Normal pulses.     Heart sounds: Normal heart sounds. No murmur heard. Pulmonary:     Effort:  Pulmonary effort is normal. No respiratory distress.     Breath sounds: Normal breath sounds. No wheezing or rales.  Abdominal:     General: There is no distension.     Palpations: Abdomen is soft.     Tenderness: There is no abdominal tenderness.  Musculoskeletal:     Cervical back: Neck supple.     Right lower leg: Edema (non pitting) present.     Left lower leg: Edema (non pitting) present.  Lymphadenopathy:     Cervical: No cervical adenopathy.  Skin:    General: Skin is warm and dry.     Findings: No rash.  Neurological:     Mental Status: She is alert and oriented to person, place,  and time. Mental status is at baseline.     Sensory: No sensory deficit.     Motor: No weakness.     Gait: Gait normal.  Psychiatric:        Mood and Affect: Mood normal.        Behavior: Behavior normal.        Thought Content: Thought content normal.      Most recent functional status assessment:    08/22/2022   10:21 AM  In your present state of health, do you have any difficulty performing the following activities:  Hearing? 1  Vision? 0  Difficulty concentrating or making decisions? 0  Walking or climbing stairs? 0  Dressing or bathing? 0  Doing errands, shopping? 0   Most recent fall risk assessment:    08/22/2022   10:20 AM  Fall Risk   Falls in the past year? 0  Number falls in past yr: 0  Injury with Fall? 0  Risk for fall due to : No Fall Risks  Follow up Falls evaluation completed    Most recent depression screenings:    08/22/2022   10:20 AM 10/25/2021    3:19 PM  PHQ 2/9 Scores  PHQ - 2 Score 0 0  PHQ- 9 Score 2 6   Most recent cognitive screening:    08/22/2022   10:22 AM  6CIT Screen  What Year? 0 points  What month? 0 points  What time? 0 points  Count back from 20 0 points  Months in reverse 0 points  Repeat phrase 0 points  Total Score 0 points   Most recent Audit-C alcohol use screening    08/22/2022   10:21 AM  Alcohol Use Disorder Test (AUDIT)   1. How often do you have a drink containing alcohol? 0  2. How many drinks containing alcohol do you have on a typical day when you are drinking? 0  3. How often do you have six or more drinks on one occasion? 0  AUDIT-C Score 0   A score of 3 or more in women, and 4 or more in men indicates increased risk for alcohol abuse, EXCEPT if all of the points are from question 1   No results found for any visits on 08/22/22.  Assessment & Plan     Annual wellness visit done today including the all of the following: Reviewed patient's Family Medical History Reviewed and updated list of patient's medical providers Assessment of cognitive impairment was done Assessed patient's functional ability Established a written schedule for health screening services Health Risk Assessent Completed and Reviewed  Exercise Activities and Dietary recommendations  Goals      Exercise 3x per week (30 min per time)     Recommend to start walking 3 days a week for at least 30 minutes at a time.         Immunization History  Administered Date(s) Administered   Fluad Quad(high Dose 65+) 04/29/2019, 07/27/2020, 08/17/2021   Influenza,inj,Quad PF,6+ Mos 06/26/2013, 04/24/2018   PFIZER(Purple Top)SARS-COV-2 Vaccination 04/29/2020, 08/19/2020   PNEUMOCOCCAL CONJUGATE-20 08/17/2021   Pneumococcal Conjugate-13 07/27/2020   Tdap 02/19/2019   Zoster Recombinat (Shingrix) 02/19/2019, 04/29/2019    Health Maintenance  Topic Date Due   INFLUENZA VACCINE  03/15/2022   COVID-19 Vaccine (3 - 2023-24 season) 04/15/2022   Medicare Annual Wellness (AWV)  08/23/2023   MAMMOGRAM  08/26/2023   COLONOSCOPY (Pts 45-36yr Insurance coverage will need to be confirmed)  03/26/2024   DTaP/Tdap/Td (2 -  Td or Tdap) 02/18/2029   Pneumonia Vaccine 66+ Years old  Completed   DEXA SCAN  Completed   Hepatitis C Screening  Completed   Zoster Vaccines- Shingrix  Completed   HPV VACCINES  Aged Out     Discussed health  benefits of physical activity, and encouraged her to engage in regular exercise appropriate for her age and condition.    Problem List Items Addressed This Visit       Endocrine   Thyroid nodule    Small and stable Recheck TSH and thyroid US (continue x5 yrs total for stability)      Relevant Orders   US THYROID   TSH     Other   Hypercholesteremia    Reviewed last lipid panel Not currently on a statin Recheck FLP and CMP Discussed diet and exercise       Relevant Orders   Lipid panel   Comprehensive metabolic panel   Anxiety and depression    Chronic and well controlled Continue effexor      Relevant Medications   venlafaxine (EFFEXOR) 75 MG tablet   Lymphedema    Noted to have non-pitting swelling in b/l legs Recommend compression stockings      Other chest pain    History not consistent with cardiac etiology Discussed MSK pain vs esophageal spasm Discussed signs of cardiac chest pain that would indicate need for immediate evaluation      Other Visit Diagnoses     Encounter for annual wellness visit (AWV) in Medicare patient    -  Primary   Avitaminosis D       Relevant Orders   VITAMIN D 25 Hydroxy (Vit-D Deficiency, Fractures)   Encounter for screening mammogram for malignant neoplasm of breast       Relevant Orders   MM 3D SCREEN BREAST BILATERAL        Return in about 1 year (around 08/23/2023) for AWV.     I, Lavon Paganini, MD, have reviewed all documentation for this visit. The documentation on 08/22/22 for the exam, diagnosis, procedures, and orders are all accurate and complete.   Baker Moronta, Dionne Bucy, MD, MPH [facility]

## 2022-08-22 NOTE — Assessment & Plan Note (Signed)
Reviewed last lipid panel Not currently on a statin Recheck FLP and CMP Discussed diet and exercise  

## 2022-08-22 NOTE — Assessment & Plan Note (Signed)
History not consistent with cardiac etiology Discussed MSK pain vs esophageal spasm Discussed signs of cardiac chest pain that would indicate need for immediate evaluation

## 2022-08-22 NOTE — Assessment & Plan Note (Signed)
Small and stable Recheck TSH and thyroid US (continue x5 yrs total for stability)

## 2022-08-23 ENCOUNTER — Telehealth: Payer: Self-pay | Admitting: Family Medicine

## 2022-08-23 LAB — COMPREHENSIVE METABOLIC PANEL
ALT: 10 IU/L (ref 0–32)
AST: 14 IU/L (ref 0–40)
Albumin/Globulin Ratio: 2.2 (ref 1.2–2.2)
Albumin: 4.6 g/dL (ref 3.9–4.9)
Alkaline Phosphatase: 107 IU/L (ref 44–121)
BUN/Creatinine Ratio: 22 (ref 12–28)
BUN: 17 mg/dL (ref 8–27)
Bilirubin Total: 0.4 mg/dL (ref 0.0–1.2)
CO2: 24 mmol/L (ref 20–29)
Calcium: 9.6 mg/dL (ref 8.7–10.3)
Chloride: 103 mmol/L (ref 96–106)
Creatinine, Ser: 0.79 mg/dL (ref 0.57–1.00)
Globulin, Total: 2.1 g/dL (ref 1.5–4.5)
Glucose: 102 mg/dL — ABNORMAL HIGH (ref 70–99)
Potassium: 4.3 mmol/L (ref 3.5–5.2)
Sodium: 142 mmol/L (ref 134–144)
Total Protein: 6.7 g/dL (ref 6.0–8.5)
eGFR: 81 mL/min/{1.73_m2} (ref >59)

## 2022-08-23 LAB — LIPID PANEL
Chol/HDL Ratio: 3.5 ratio (ref 0.0–4.4)
Cholesterol, Total: 226 mg/dL — ABNORMAL HIGH (ref 100–199)
HDL: 65 mg/dL (ref >39)
LDL Chol Calc (NIH): 143 mg/dL — ABNORMAL HIGH (ref 0–99)
Triglycerides: 100 mg/dL (ref 0–149)
VLDL Cholesterol Cal: 18 mg/dL (ref 5–40)

## 2022-08-23 LAB — TSH: TSH: 2.1 u[IU]/mL (ref 0.450–4.500)

## 2022-08-23 LAB — VITAMIN D 25 HYDROXY (VIT D DEFICIENCY, FRACTURES): Vit D, 25-Hydroxy: 21.6 ng/mL — ABNORMAL LOW (ref 30.0–100.0)

## 2022-08-23 MED ORDER — VITAMIN D (ERGOCALCIFEROL) 1.25 MG (50000 UNIT) PO CAPS: 50000.0000 [IU] | ORAL_CAPSULE | ORAL | 0 refills | Status: DC

## 2022-08-23 NOTE — Telephone Encounter (Signed)
Pt called in regarding pt labs and stated she would like a prescription for Vit D3 high dose weekly supplement again for 3 months. Per Dr. Sharmaine Base comments on the lab results.  Please send to the pharmacy below.  [id number] [id number] Phone: [phone]: [phone] Hours: Not open 24 hours

## 2022-08-25 ENCOUNTER — Other Ambulatory Visit: Payer: Self-pay | Admitting: Family Medicine

## 2022-08-29 ENCOUNTER — Other Ambulatory Visit: Payer: Medicare Other

## 2022-08-29 NOTE — Telephone Encounter (Signed)
Pt called saying the pharmacy told her it was denied by the provider.  She said if it was the insurance she would pay out of pocket.  CB#  [id number]

## 2022-08-31 ENCOUNTER — Ambulatory Visit: Payer: Medicare Other

## 2022-09-01 ENCOUNTER — Other Ambulatory Visit: Payer: Self-pay | Admitting: Gastroenterology

## 2022-09-01 ENCOUNTER — Ambulatory Visit
Admission: RE | Admit: 2022-09-01 | Discharge: 2022-09-01 | Disposition: A | Discharge status: Home / Self Care | Payer: Medicare Other | Source: Ambulatory Visit | Attending: Family Medicine | Admitting: Family Medicine

## 2022-09-01 DIAGNOSIS — E041 Nontoxic single thyroid nodule: Secondary | ICD-10-CM | POA: Diagnosis not present

## 2022-09-16 ENCOUNTER — Other Ambulatory Visit: Payer: Self-pay | Admitting: Family Medicine

## 2022-09-27 ENCOUNTER — Encounter: Payer: Self-pay | Admitting: Gastroenterology

## 2022-09-27 ENCOUNTER — Ambulatory Visit (INDEPENDENT_AMBULATORY_CARE_PROVIDER_SITE_OTHER): Payer: Medicare Other | Admitting: Gastroenterology

## 2022-09-27 VITALS — BP 144/88 | HR 94 | Temp 98.4°F | Wt 218.0 lb

## 2022-09-27 DIAGNOSIS — K219 Gastro-esophageal reflux disease without esophagitis: Secondary | ICD-10-CM

## 2022-09-27 MED ORDER — PANTOPRAZOLE SODIUM 20 MG PO TBEC: 20.0000 mg | DELAYED_RELEASE_TABLET | Freq: Every day | ORAL | 0 refills | Status: DC

## 2022-09-27 NOTE — Progress Notes (Signed)
Primary Care Physician: Virginia Crews, MD  Primary Gastroenterologist:  Dr. Lucilla Lame  Chief Complaint  Patient presents with   Follow-up   Gastroesophageal Reflux    Pt reports she is doing well, denies any new concerns    HPI: Elaine Melendez is a 67 y.o. female here with a history of a colonoscopy in 2020 for diarrhea with a history of GERD being treated with pantoprazole.  The patient was encouraged to keep a food diary at the last visit so that she may be able to isolate what may be causing her diarrhea.  The patient was also avoiding dairy products because that seem to cause her issues. The patient reports that she has been doing well on her medication.  Her diarrhea comes and goes and she has not noticed any particular foods that have been making it better or worse recently.  Past Medical History:  Diagnosis Date   Anxiety    Cannot sleep 05/10/2017   Dense breast tissue 05/25/2017   Dysrhythmia    remote hs tachycardia>15 yrs ago, no meds   GERD (gastroesophageal reflux disease)    Headache    Hypercholesteremia 01/31/2007   Joint effusion 12/05/2017   Left breast mass 06/21/2017   Meniere's disease    Polyarthralgia 12/05/2017    Current Outpatient Medications  Medication Sig Dispense Refill   celecoxib (CELEBREX) 200 MG capsule Take 200 mg by mouth 2 (two) times daily as needed.     dimenhyDRINATE (DRAMAMINE) 50 MG tablet Take 50 mg by mouth every 8 (eight) hours as needed.     KLOR-CON M20 20 MEQ tablet TAKE 1 TABLET BY MOUTH EVERY DAY 90 tablet 3   nystatin (MYCOSTATIN/NYSTOP) powder Apply topically 3 (three) times daily. 30 g 3   pantoprazole (PROTONIX) 40 MG tablet TAKE 1 TABLET BY MOUTH EVERYDAY AT BEDTIME 90 tablet 0   SUMAtriptan (IMITREX) 50 MG tablet Take 1 tablet (50 mg total) by mouth every 2 (two) hours as needed for migraine. 10 tablet 2   venlafaxine (EFFEXOR) 75 MG tablet Take 1 tablet (75 mg total) by mouth daily. 90 tablet 1   Vitamin D,  Ergocalciferol, (DRISDOL) 1.25 MG (50000 UNIT) CAPS capsule Take 1 capsule (50,000 Units total) by mouth every 7 (seven) days. 12 capsule 0   No current facility-administered medications for this visit.    Allergies as of 09/27/2022 - Review Complete 09/27/2022  Allergen Reaction Noted   Nickel Rash 01/31/2007    ROS:  General: Negative for anorexia, weight loss, fever, chills, fatigue, weakness. ENT: Negative for hoarseness, difficulty swallowing , nasal congestion. CV: Negative for chest pain, angina, palpitations, dyspnea on exertion, peripheral edema.  Respiratory: Negative for dyspnea at rest, dyspnea on exertion, cough, sputum, wheezing.  GI: See history of present illness. GU:  Negative for dysuria, hematuria, urinary incontinence, urinary frequency, nocturnal urination.  Endo: Negative for unusual weight change.    Physical Examination:   BP (!) 144/88 (BP Location: Left Arm, Patient Position: Sitting, Cuff Size: Large)   Pulse 94   Temp 98.4 F (36.9 C) (Oral)   Wt 218 lb (98.9 kg)   LMP  (LMP Unknown)   BMI 37.42 kg/m   General: Well-nourished, well-developed in no acute distress.  Eyes: No icterus. Conjunctivae pink. Neuro: Alert and oriented x 3.  Grossly intact. Skin: Warm and dry, no jaundice.   Psych: Alert and cooperative, normal mood and affect.  Labs:    Imaging Studies: US THYROID  Result Date: 09/01/2022 CLINICAL DATA:  Prior ultrasound follow-up.  thyroid nodule EXAM: THYROID ULTRASOUND TECHNIQUE: Ultrasound examination of the thyroid gland and adjacent soft tissues was performed. COMPARISON:  US Thyroid, 08/24/2021 and thyroid biopsy, 09/21/2021. FINDINGS: Parenchymal Echotexture: Mildly heterogenous Isthmus: 0.5 cm Right lobe: 5.0 x 2.0 x 1.8 cm Left lobe: 4.6 x 2.5 x 1.6 cm _________________________________________________________ Estimated total number of nodules >/= 1 cm: 2 Number of spongiform nodules >/=  2 cm not described below (TR1): 0 Number  of mixed cystic and solid nodules >/= 1.5 cm not described below (TR2): 0 _________________________________________________________ Nodule # 1: Location: RIGHT; Inferior Maximum size: 1.9 cm; Other 2 dimensions: 1.5 x 1.3 cm, previously 1.7 x 1.51.2 cm No aggressive features on today's evaluation. This nodule appears morphologically stable and was previously biopsied in 09/21/2021. Assuming a benign pathologic diagnosis, repeat sampling and/or dedicated follow-up is not recommended. _________________________________________________________ Nodule # 2: Location: LEFT; Inferior Maximum size: 1.8 cm; Other 2 dimensions: 1.3 x 1.3 cm previously 1.8 x 1.5 x 1.2 cm Composition: mixed cystic and solid (1) Echogenicity: hypoechoic (2) Shape: not taller-than-wide (0) Margins: ill-defined (0) Echogenic foci: none (0) ACR TI-RADS total points: 3. ACR TI-RADS risk category: TR3 (3 points). ACR TI-RADS recommendations: *Given size (>/= 1.5 - 2.4 cm) and appearance, a follow-up ultrasound in 1 year should be considered based on TI-RADS criteria. _________________________________________________________ No cervical adenopathy or abnormal fluid collection within the imaged neck. IMPRESSION: 1. Mild heterogeneity of the thyroid gland. 2. 1.9 cm RIGHT inferior thyroid nodule was biopsied on 09/21/2021. Assuming a benign pathologic diagnosis, repeat sampling and/or dedicated follow-up is NOT recommended. 3. Stable 1.8 cm LEFT inferior mixed cystic and solid nodule. A follow-up ultrasound in 1 year should be considered based on TI-RADS criteria. Year 4 of 5 recommended follow-up for stability. The above is in keeping with the ACR TI-RADS recommendations - J Am Coll Radiol 2017;14:587-595. Electronically Signed   By: Michaelle Birks M.D.   On: 09/01/2022 15:07    Assessment and Plan:   Elaine Melendez is a 67 y.o. y/o female who comes in today with a history of GERD without any worry symptoms.  She denies any unexplained weight loss  black stools bloody stools nausea vomiting fevers or chills.  She does report that she does have some dizziness and nausea but this is from her inner ear problem.  The patient has been told that she can decrease her pantoprazole to 20 mg a day to see if that helps her symptoms and if it does then we will refill that prescription otherwise she will let us know if it is not working and we will refill the 40 mg/day.  The patient has been explained the plan and agrees with it.     Lucilla Lame, MD. Marval Regal    Note: This dictation was prepared with Dragon dictation along with smaller phrase technology. Any transcriptional errors that result from this process are unintentional.

## 2022-10-11 ENCOUNTER — Encounter: Payer: Self-pay | Admitting: Gastroenterology

## 2022-10-12 MED ORDER — PANTOPRAZOLE SODIUM 40 MG PO TBEC: 40.0000 mg | DELAYED_RELEASE_TABLET | Freq: Every day | ORAL | 1 refills | Status: DC

## 2022-10-12 NOTE — Addendum Note (Signed)
Addended by: Lurlean Nanny on: 10/12/2022 02:06 PM   Modules accepted: Orders

## 2022-10-28 ENCOUNTER — Ambulatory Visit
Admission: RE | Admit: 2022-10-28 | Discharge: 2022-10-28 | Disposition: A | Discharge status: Home / Self Care | Payer: Medicare Other | Source: Ambulatory Visit | Attending: Family Medicine | Admitting: Family Medicine

## 2022-10-28 DIAGNOSIS — Z1231 Encounter for screening mammogram for malignant neoplasm of breast: Secondary | ICD-10-CM | POA: Diagnosis present

## 2022-11-03 ENCOUNTER — Telehealth: Payer: Self-pay

## 2022-11-03 NOTE — Progress Notes (Signed)
Please, let pt know that her mammogram results were negative. Screening should be repeated in a year.

## 2022-11-03 NOTE — Telephone Encounter (Signed)
Copied from [place]. Topic: General - Other >> Nov 03, 2022  4:10 PM Santiya F wrote: Reason for CRM: Pt is returning a call from the office. Pt says she wasn't sure if anyone left a VM.

## 2023-01-13 ENCOUNTER — Other Ambulatory Visit: Payer: Self-pay | Admitting: Gastroenterology

## 2023-03-01 ENCOUNTER — Other Ambulatory Visit: Payer: Self-pay | Admitting: Family Medicine

## 2023-03-01 NOTE — Telephone Encounter (Signed)
Requested Prescriptions  Pending Prescriptions Disp Refills   venlafaxine (EFFEXOR) 75 MG tablet [Pharmacy Med Name: VENLAFAXINE HCL 75 MG TABLET] 90 tablet 1    Sig: TAKE 1 TABLET BY MOUTH EVERY DAY     Psychiatry: Antidepressants - SNRI - desvenlafaxine & venlafaxine Failed - 03/01/2023  2:07 AM      Failed - Last BP in normal range    BP Readings from Last 1 Encounters:  09/27/22 (!) 144/88         Failed - Valid encounter within last 6 months    Recent Outpatient Visits           6 months ago Encounter for annual wellness visit (AWV) in Medicare patient   [facility] [place], Marzella Schlein, MD   1 year ago Fatigue, unspecified type   [facility] [place], Marzella Schlein, MD   1 year ago Encounter for annual wellness visit (AWV) in Medicare patient   [facility] [place], Marzella Schlein, MD   1 year ago Viral bronchitis   [facility] Alfredia Ferguson, PA-C   2 years ago Hypercholesteremia    [facility] [place], Marzella Schlein, MD       Future Appointments             In 5 months Ok Edwards, Lou Cal [facility], [facility]            Failed - Lipid Panel in normal range within the last 12 months    Cholesterol, Total  Date Value Ref Range Status  08/22/2022 226 (H) 100 - 199 mg/dL Final   LDL Chol Calc (NIH)  Date Value Ref Range Status  08/22/2022 143 (H) 0 - 99 mg/dL Final   HDL  Date Value Ref Range Status  08/22/2022 65 >39 mg/dL Final   Triglycerides  Date Value Ref Range Status  08/22/2022 100 0 - 149 mg/dL Final         Passed - Cr in normal range and within 360 days    Creatinine, Ser  Date Value Ref Range Status  08/22/2022 0.79 0.57 - 1.00 mg/dL Final         Passed - Completed PHQ-2 or PHQ-9 in the last 360 days

## 2023-03-02 ENCOUNTER — Other Ambulatory Visit: Payer: Self-pay | Admitting: Physician Assistant

## 2023-03-16 ENCOUNTER — Telehealth: Payer: Self-pay

## 2023-03-16 NOTE — Telephone Encounter (Signed)
Copied from CRM [id number]. Topic: Appointment Scheduling - Scheduling Inquiry for Clinic >> Mar 16, 2023  4:27 PM Marlow Baars wrote: Reason for CRM: The patient called in inquiring if her provider wanted her to get updated blood work to check her vitamin d level. She was taking prescription strength vitamin d but has not had it in a while and did know if she needs to see her provider as its been since January and or get updated blood work done. She is also wondering if her provider would just want her to get over the counter vitamin d. Please assist patient further

## 2023-03-20 ENCOUNTER — Encounter: Payer: Self-pay | Admitting: Family Medicine

## 2023-03-20 NOTE — Telephone Encounter (Signed)
She should have resumed OTC Vit D3 2000 units daily after finishing the high dose supplement.  Can recheck, but will likely be low now if she has been off of all Vit D supplement.  Would recheck after resuming supplement for at least 1-2 months.  Also, can her appt for AWV with Lillia Abed be moved to my schedule for 2025? Thanks!

## 2023-03-20 NOTE — Telephone Encounter (Signed)
Patient advised as below.  

## 2023-04-07 ENCOUNTER — Encounter: Payer: Self-pay | Admitting: Family Medicine

## 2023-04-07 ENCOUNTER — Telehealth (INDEPENDENT_AMBULATORY_CARE_PROVIDER_SITE_OTHER): Payer: Medicare Other | Admitting: Family Medicine

## 2023-04-07 ENCOUNTER — Ambulatory Visit: Payer: Self-pay | Admitting: *Deleted

## 2023-04-07 DIAGNOSIS — U071 COVID-19: Secondary | ICD-10-CM | POA: Diagnosis not present

## 2023-04-07 MED ORDER — ALBUTEROL SULFATE HFA 108 (90 BASE) MCG/ACT IN AERS: 2.0000 | INHALATION_SPRAY | Freq: Four times a day (QID) | RESPIRATORY_TRACT | 0 refills | Status: DC | PRN

## 2023-04-07 MED ORDER — NIRMATRELVIR/RITONAVIR (PAXLOVID)TABLET: 3.0000 | ORAL_TABLET | Freq: Two times a day (BID) | ORAL | 0 refills | Status: AC

## 2023-04-07 MED ORDER — BENZONATATE 100 MG PO CAPS: 100.0000 mg | ORAL_CAPSULE | Freq: Two times a day (BID) | ORAL | 0 refills | Status: DC | PRN

## 2023-04-07 NOTE — Progress Notes (Signed)
MyChart Video Visit    Virtual Visit via Video Note   This format is felt to be most appropriate for this patient at this time. Physical exam was limited by quality of the video and audio technology used for the visit.   Patient location: Home Provider location: [facility]  I discussed the limitations of evaluation and management by telemedicine and the availability of in person appointments. The patient expressed understanding and agreed to proceed.  Patient: Elaine Melendez   DOB: [birth year]   67 y.o. Female  MRN: [id number] Visit Date: 04/07/2023  Today's healthcare provider: Sherlyn Hay, DO   Chief Complaint  Patient presents with   Covid Positive     Covid positive at home test yesterday. Symptoms: dry cough, stuffy nose, nasal congestion, headache, hoarse voice. Frequency: started Monday night     Subjective    HPI HPI     Covid Positive    Additional comments:  Covid positive at home test yesterday. Symptoms: dry cough, stuffy nose, nasal congestion, headache, hoarse voice. Frequency: started Monday night        Last edited by Marjie Skiff, CMA on 04/07/2023 10:22 AM.      Tested herself yesterday after finding out her father tested positive for COVID Started Monday night with feeling she had something in her chest. Tuesday morning: dry cough, stuffy nose, nasal congestion, headache, hoarse voice. Last night - stopped up on both sides and couldn't breathe. Used her fluticasone which has helped clear up her nose some Taking Dayquil and Nyquil and cough drops Loose stool last night and the night before  Uses CPAP for sleep apnea - did not use last night because of nasal congestion   Medications: Outpatient Medications Prior to Visit  Medication Sig   celecoxib (CELEBREX) 200 MG capsule Take 200 mg by mouth 2 (two) times daily as needed.   Cyanocobalamin (VITAMIN B12 PO) Take by mouth.   dimenhyDRINATE (DRAMAMINE) 50 MG tablet  Take 50 mg by mouth every 8 (eight) hours as needed.   fluticasone (FLONASE) 50 MCG/ACT nasal spray SPRAY 2 SPRAYS INTO EACH NOSTRIL EVERY DAY   KLOR-CON M20 20 MEQ tablet TAKE 1 TABLET BY MOUTH EVERY DAY   nystatin (MYCOSTATIN/NYSTOP) powder Apply topically 3 (three) times daily.   pantoprazole (PROTONIX) 40 MG tablet TAKE 1 TABLET BY MOUTH EVERYDAY AT BEDTIME   venlafaxine (EFFEXOR) 75 MG tablet TAKE 1 TABLET BY MOUTH EVERY DAY   VITAMIN D PO Take by mouth daily.   [DISCONTINUED] Vitamin D, Ergocalciferol, (DRISDOL) 1.25 MG (50000 UNIT) CAPS capsule Take 1 capsule (50,000 Units total) by mouth every 7 (seven) days.   SUMAtriptan (IMITREX) 50 MG tablet Take 1 tablet (50 mg total) by mouth every 2 (two) hours as needed for migraine. (Patient not taking: Reported on 04/07/2023)   No facility-administered medications prior to visit.    Review of Systems  Constitutional:  Negative for appetite change, chills, fatigue and fever.  HENT:  Positive for congestion. Negative for ear pain and sore throat.   Respiratory:  Positive for chest tightness (Only Monday night). Negative for shortness of breath.   Cardiovascular:  Negative for chest pain and palpitations.  Gastrointestinal:  Negative for abdominal pain, nausea and vomiting.  Neurological:  Negative for dizziness and weakness.        Objective    LMP  (LMP Unknown)       Physical Exam Constitutional:      General: She  is not in acute distress.    Appearance: Normal appearance.  HENT:     Head: Normocephalic.  Pulmonary:     Effort: Pulmonary effort is normal. No respiratory distress.  Neurological:     Mental Status: She is alert and oriented to person, place, and time. Mental status is at baseline.        Assessment & Plan    Positive self-administered antigen test for COVID-19 -     nirmatrelvir/ritonavir; Take 3 tablets by mouth 2 (two) times daily for 5 days. (Take nirmatrelvir 150 mg two tablets twice daily for 5  days and ritonavir 100 mg one tablet twice daily for 5 days) Patient GFR is 81  Dispense: 5 tablet; Refill: 0 -     Benzonatate; Take 1 capsule (100 mg total) by mouth 2 (two) times daily as needed for cough.  Dispense: 20 capsule; Refill: 0 -     Albuterol Sulfate HFA; Inhale 2 puffs into the lungs every 6 (six) hours as needed for wheezing or shortness of breath.  Dispense: 8 g; Refill: 0  Given the patient's comorbidities and worsening/failure to improve after most of this week, we will go ahead and start patient on Paxlovid, as she remains just within the 5 days from start of her symptoms to start it.  Prescribed her Tessalon Perles and albuterol for coughing fits.  Advised her to continue supportive care.  Advised patient to contact us for any questions or concerns, but also to proceed to the ER if she develops acute shortness of breath, chest pain or other symptoms of severe concern, particularly as we proceed into the weekend.   Return if symptoms worsen or fail to improve.     I discussed the assessment and treatment plan with the patient. The patient was provided an opportunity to ask questions and all were answered. The patient agreed with the plan and demonstrated an understanding of the instructions.   The patient was advised to call back or seek an in-person evaluation if the symptoms worsen or if the condition fails to improve as anticipated.  I provided 14 minutes of virtual-face-to-face time during this encounter.    Sherlyn Hay, DO [facility] [phone] (phone) [phone] (fax)  [facility]

## 2023-04-07 NOTE — Telephone Encounter (Signed)
Patient scheduled for this afternoon with Dr. Wille Glaser video visit.

## 2023-04-07 NOTE — Telephone Encounter (Signed)
Chief Complaint: covid positive at home test yesterday, requesting if medication needed and if she should continue to use CPAP at night  Symptoms: dry cough, stuffy nose, nasal congestion, headache, hoarse voice.  Frequency: started Monday night  Pertinent Negatives: Patient denies chest pain no difficulty breathing no fever Disposition: [] ED /[] Urgent Care (no appt availability in office) / [] Appointment(In office/virtual)/ []  [facility]/ [] Home Care/ [x] Refused Recommended Disposition /[] [facility]/ []  Follow-up with PCP Additional Notes:   No available appt or VV at practice. Offered UC VV. Patient would like to know if PCP recommended medication . Pt would like call back regarding if she should continue to use CPAP machine and mask at night. Please advise.  Summary: congestion , headache and hoarse voice   Pt called in asking for med to be sent in for covid she tested positive for covid yesterday. She has congestion , headache and hoarse voice.She states she just feels bad. [id number] [place], [place] - [address] Phone: [phone] Fax: [phone]             Reason for Disposition  [1] HIGH RISK patient (e.g., weak immune system, age > 64 years, obesity with BMI 30 or higher, pregnant, chronic lung disease or other chronic medical condition) AND [2] COVID symptoms (e.g., cough, fever)  (Exceptions: Already seen by PCP and no new or worsening symptoms.)  Answer Assessment - Initial Assessment Questions 1. COVID-19 DIAGNOSIS: "How do you know that you have COVID?" (e.g., positive lab test or self-test, diagnosed by doctor or NP/PA, symptoms after exposure).     At home covid positive  2. COVID-19 EXPOSURE: "Was there any known exposure to COVID before the symptoms began?" CDC Definition of close contact: within 6 feet (2 meters) for a total of 15 minutes or more over a 24-hour period.      Around father  34. ONSET: "When did the COVID-19  symptoms start?"      Monday  4. WORST SYMPTOM: "What is your worst symptom?" (e.g., cough, fever, shortness of breath, muscle aches)     Dry cough, headache, hoarse, runny nose  5. COUGH: "Do you have a cough?" If Yes, ask: "How bad is the cough?"       Dry cough  6. FEVER: "Do you have a fever?" If Yes, ask: "What is your temperature, how was it measured, and when did it start?"     na 7. RESPIRATORY STATUS: "Describe your breathing?" (e.g., normal; shortness of breath, wheezing, unable to speak)      ok 8. BETTER-SAME-WORSE: "Are you getting better, staying the same or getting worse compared to yesterday?"  If getting worse, ask, "In what way?"     better 9. OTHER SYMPTOMS: "Do you have any other symptoms?"  (e.g., chills, fatigue, headache, loss of smell or taste, muscle pain, sore throat)     Headache, cough, nasal congestion stuffy nose, hoarse voice 10. HIGH RISK DISEASE: "Do you have any chronic medical problems?" (e.g., asthma, heart or lung disease, weak immune system, obesity, etc.)       na 11. VACCINE: "Have you had the COVID-19 vaccine?" If Yes, ask: "Which one, how many shots, when did you get it?"       na 12. PREGNANCY: "Is there any chance you are pregnant?" "When was your last menstrual period?"       na 13. O2 SATURATION MONITOR:  "Do you use an oxygen saturation monitor (pulse oximeter) at home?"  If Yes, ask "What is your reading (oxygen level) today?" "What is your usual oxygen saturation reading?" (e.g., 95%)       na  Protocols used: Coronavirus (COVID-19) Diagnosed or Suspected-A-AH

## 2023-04-29 ENCOUNTER — Other Ambulatory Visit: Payer: Self-pay | Admitting: Family Medicine

## 2023-04-29 DIAGNOSIS — U071 COVID-19: Secondary | ICD-10-CM

## 2023-05-01 NOTE — Telephone Encounter (Signed)
Requested Prescriptions  Pending Prescriptions Disp Refills   albuterol (VENTOLIN HFA) 108 (90 Base) MCG/ACT inhaler [Pharmacy Med Name: ALBUTEROL HFA (PROAIR) INHALER] 8.5 each 2    Sig: TAKE 2 PUFFS BY MOUTH EVERY 6 HOURS AS NEEDED FOR WHEEZE OR SHORTNESS OF BREATH     Pulmonology:  Beta Agonists 2 Failed - 04/29/2023  8:40 AM      Failed - Last BP in normal range    BP Readings from Last 1 Encounters:  09/27/22 (!) 144/88         Passed - Last Heart Rate in normal range    Pulse Readings from Last 1 Encounters:  09/27/22 94         Passed - Valid encounter within last 12 months    Recent Outpatient Visits           3 weeks ago Positive self-administered antigen test for COVID-19   [facility] [place], Monico Blitz, DO   8 months ago Encounter for annual wellness visit (AWV) in Medicare patient   [facility] [place], Marzella Schlein, MD   1 year ago Fatigue, unspecified type   [facility] [place], Marzella Schlein, MD   1 year ago Encounter for annual wellness visit (AWV) in Medicare patient   [facility] [place], Marzella Schlein, MD   1 year ago Viral bronchitis   [facility] Alfredia Ferguson, PA-C       Future Appointments             In 3 months Ok Edwards, Lou Cal [facility], [facility]

## 2023-05-26 ENCOUNTER — Other Ambulatory Visit: Payer: Self-pay | Admitting: Family Medicine

## 2023-05-26 NOTE — Telephone Encounter (Signed)
Requested Prescriptions  Pending Prescriptions Disp Refills   venlafaxine (EFFEXOR) 75 MG tablet [Pharmacy Med Name: VENLAFAXINE HCL 75 MG TABLET] 90 tablet 1    Sig: TAKE 1 TABLET BY MOUTH EVERY DAY     Psychiatry: Antidepressants - SNRI - desvenlafaxine & venlafaxine Failed - 05/26/2023  2:30 AM      Failed - Last BP in normal range    BP Readings from Last 1 Encounters:  09/27/22 (!) 144/88         Failed - Lipid Panel in normal range within the last 12 months    Cholesterol, Total  Date Value Ref Range Status  08/22/2022 226 (H) 100 - 199 mg/dL Final   LDL Chol Calc (NIH)  Date Value Ref Range Status  08/22/2022 143 (H) 0 - 99 mg/dL Final   HDL  Date Value Ref Range Status  08/22/2022 65 >39 mg/dL Final   Triglycerides  Date Value Ref Range Status  08/22/2022 100 0 - 149 mg/dL Final         Passed - Cr in normal range and within 360 days    Creatinine, Ser  Date Value Ref Range Status  08/22/2022 0.79 0.57 - 1.00 mg/dL Final         Passed - Completed PHQ-2 or PHQ-9 in the last 360 days      Passed - Valid encounter within last 6 months    Recent Outpatient Visits           1 month ago Positive self-administered antigen test for COVID-19   [facility] [place], Monico Blitz, DO   9 months ago Encounter for annual wellness visit (AWV) in Medicare patient   [facility] [place], Marzella Schlein, MD   1 year ago Fatigue, unspecified type   [facility] [place], Marzella Schlein, MD   1 year ago Encounter for annual wellness visit (AWV) in Medicare patient   [facility] [place], Marzella Schlein, MD   1 year ago Viral bronchitis   [facility] Alfredia Ferguson, PA-C       Future Appointments             In 3 months Ok Edwards, Lou Cal [facility], [facility]

## 2023-07-19 DIAGNOSIS — Z96611 Presence of right artificial shoulder joint: Secondary | ICD-10-CM | POA: Insufficient documentation

## 2023-07-19 DIAGNOSIS — M25512 Pain in left shoulder: Secondary | ICD-10-CM | POA: Insufficient documentation

## 2023-08-24 ENCOUNTER — Ambulatory Visit
Admission: RE | Admit: 2023-08-24 | Discharge: 2023-08-24 | Disposition: A | Discharge status: Home / Self Care | Payer: Medicare Other | Attending: Family Medicine | Admitting: Family Medicine

## 2023-08-24 ENCOUNTER — Ambulatory Visit
Admission: RE | Admit: 2023-08-24 | Discharge: 2023-08-24 | Disposition: A | Discharge status: Home / Self Care | Payer: Medicare Other | Source: Ambulatory Visit | Attending: Family Medicine | Admitting: Family Medicine

## 2023-08-24 ENCOUNTER — Ambulatory Visit (INDEPENDENT_AMBULATORY_CARE_PROVIDER_SITE_OTHER): Payer: Medicare Other | Admitting: Family Medicine

## 2023-08-24 VITALS — BP 143/70 | HR 79 | Ht 64.0 in | Wt 228.2 lb

## 2023-08-24 DIAGNOSIS — Z6839 Body mass index (BMI) 39.0-39.9, adult: Secondary | ICD-10-CM | POA: Diagnosis not present

## 2023-08-24 DIAGNOSIS — W19XXXA Unspecified fall, initial encounter: Secondary | ICD-10-CM

## 2023-08-24 DIAGNOSIS — Z23 Encounter for immunization: Secondary | ICD-10-CM

## 2023-08-24 DIAGNOSIS — E559 Vitamin D deficiency, unspecified: Secondary | ICD-10-CM | POA: Diagnosis not present

## 2023-08-24 DIAGNOSIS — Z0001 Encounter for general adult medical examination with abnormal findings: Secondary | ICD-10-CM | POA: Diagnosis not present

## 2023-08-24 DIAGNOSIS — R0781 Pleurodynia: Secondary | ICD-10-CM

## 2023-08-24 DIAGNOSIS — Z0000 Encounter for general adult medical examination without abnormal findings: Secondary | ICD-10-CM

## 2023-08-24 DIAGNOSIS — R739 Hyperglycemia, unspecified: Secondary | ICD-10-CM | POA: Diagnosis not present

## 2023-08-24 DIAGNOSIS — E7800 Pure hypercholesterolemia, unspecified: Secondary | ICD-10-CM | POA: Diagnosis not present

## 2023-08-24 DIAGNOSIS — Z1231 Encounter for screening mammogram for malignant neoplasm of breast: Secondary | ICD-10-CM

## 2023-08-24 DIAGNOSIS — E041 Nontoxic single thyroid nodule: Secondary | ICD-10-CM

## 2023-08-24 DIAGNOSIS — R030 Elevated blood-pressure reading, without diagnosis of hypertension: Secondary | ICD-10-CM

## 2023-08-24 MED ORDER — HYDROCODONE-ACETAMINOPHEN 10-325 MG PO TABS: 0.5000 | ORAL_TABLET | Freq: Four times a day (QID) | ORAL | 0 refills | Status: AC | PRN

## 2023-08-24 NOTE — Assessment & Plan Note (Signed)
 Discussed importance of healthy weight management Discussed diet and exercise

## 2023-08-24 NOTE — Assessment & Plan Note (Signed)
 Continue supplement Recheck level

## 2023-08-24 NOTE — Assessment & Plan Note (Signed)
 Reviewed last lipid panel Not currently on a statin Recheck FLP and CMP Discussed diet and exercise

## 2023-08-24 NOTE — Progress Notes (Signed)
 Annual Wellness Visit     Patient: Elaine Melendez, Female    DOB: March 01, 1956, 68 y.o.   MRN: [id number] Visit Date: 08/24/2023  Today's Provider: Jon Eva, MD   Chief Complaint  Patient presents with   Annual Exam   Fall    Patient reports falling 8 days ago while running to get phone at home and feet went from under her. Patient reports landing on her side really hard. States she did not hit her head. She reports taking Celebrex  for her shoulder and tylenol . Report pain is 8-9/10   Subjective    Elaine Melendez is a 68 y.o. female who presents today for her Annual Wellness Visit.   Discussed the use of AI scribe software for clinical note transcription with the patient, who gave verbal consent to proceed.  History of Present Illness   The patient, with a history of shoulder issues, presents for a wellness visit but also reports a recent fall at home. The patient was rushing to answer the phone when they slipped on their hardwood floors, falling onto their right side. They report persistent and worsening right-sided rib pain since the fall, which occurred eight days ago. The pain is described as severe and is associated with difficulty breathing. The patient also reports pain radiating around to the back and up into the chest. The patient has been using a heating pad for relief and taking Celebrex  and Tylenol , which have not been effective in managing the pain. The patient also mentions a shoulder issue that needs to be addressed in the future.             Medications: Outpatient Medications Prior to Visit  Medication Sig   albuterol  (VENTOLIN  HFA) 108 (90 Base) MCG/ACT inhaler TAKE 2 PUFFS BY MOUTH EVERY 6 HOURS AS NEEDED FOR WHEEZE OR SHORTNESS OF BREATH   celecoxib  (CELEBREX ) 200 MG capsule Take 200 mg by mouth 2 (two) times daily as needed.   Cyanocobalamin (VITAMIN B12 PO) Take by mouth.   dimenhyDRINATE (DRAMAMINE) 50 MG tablet Take 50 mg by mouth every  8 (eight) hours as needed.   fluticasone  (FLONASE ) 50 MCG/ACT nasal spray SPRAY 2 SPRAYS INTO EACH NOSTRIL EVERY DAY   KLOR-CON  M20 20 MEQ tablet TAKE 1 TABLET BY MOUTH EVERY DAY   nystatin  (MYCOSTATIN /NYSTOP ) powder Apply topically 3 (three) times daily.   pantoprazole  (PROTONIX ) 40 MG tablet TAKE 1 TABLET BY MOUTH EVERYDAY AT BEDTIME   SUMAtriptan (IMITREX) 50 MG tablet Take 1 tablet (50 mg total) by mouth every 2 (two) hours as needed for migraine. (Patient not taking: Reported on 04/07/2023)   venlafaxine  (EFFEXOR ) 75 MG tablet TAKE 1 TABLET BY MOUTH EVERY DAY   VITAMIN D  PO Take by mouth daily.   [DISCONTINUED] benzonatate  (TESSALON ) 100 MG capsule Take 1 capsule (100 mg total) by mouth 2 (two) times daily as needed for cough.   No facility-administered medications prior to visit.    Allergies  Allergen Reactions   Nickel Rash    Patient Care Team: Eva Jon HERO, MD as PCP - General (Family Medicine) Carolee Manus DASEN., MD (Ophthalmology) Aron Shoulders, MD as Consulting Physician (General Surgery) Krasinski, Kevin, MD as Referring Physician (Orthopedic Surgery) Leora Lynwood SAUNDERS, MD as Consulting Physician (Orthopedic Surgery) Janalyn Keene NOVAK, MD (Inactive) as Consulting Physician (Gastroenterology) Volney Lye, MD (Inactive) as Referring Physician (Cardiothoracic Surgery) Oneita Na, MD as Referring Physician (Specialist)  Review of Systems       Objective  Vitals: BP (!) 143/70 (BP Location: Left Arm, Patient Position: Sitting, Cuff Size: Large)   Pulse 79   Ht 5' 4 (1.626 m)   Wt 228 lb 3.2 oz (103.5 kg)   LMP  (LMP Unknown)   SpO2 98%   BMI 39.17 kg/m      Physical Exam Vitals reviewed.  Constitutional:      General: She is not in acute distress.    Appearance: Normal appearance. She is well-developed. She is not diaphoretic.  HENT:     Head: Normocephalic and atraumatic.     Right Ear: Tympanic membrane, ear canal and external ear  normal.     Left Ear: Tympanic membrane, ear canal and external ear normal.     Nose: Nose normal.     Mouth/Throat:     Mouth: Mucous membranes are moist.     Pharynx: Oropharynx is clear. No oropharyngeal exudate.  Eyes:     General: No scleral icterus.    Conjunctiva/sclera: Conjunctivae normal.     Pupils: Pupils are equal, round, and reactive to light.  Neck:     Thyroid : No thyromegaly.  Cardiovascular:     Rate and Rhythm: Normal rate and regular rhythm.     Heart sounds: Normal heart sounds. No murmur heard. Pulmonary:     Effort: Pulmonary effort is normal. No respiratory distress.     Breath sounds: Normal breath sounds. No wheezing or rales.  Chest:     Chest wall: Tenderness (anterior R ribs and lateral) present.  Abdominal:     General: There is no distension.     Palpations: Abdomen is soft.     Tenderness: There is no abdominal tenderness.  Musculoskeletal:        General: No deformity.     Cervical back: Neck supple.     Right lower leg: No edema.     Left lower leg: No edema.  Lymphadenopathy:     Cervical: No cervical adenopathy.  Skin:    General: Skin is warm and dry.     Findings: No rash.  Neurological:     Mental Status: She is alert and oriented to person, place, and time. Mental status is at baseline.     Gait: Gait normal.  Psychiatric:        Mood and Affect: Mood normal.        Behavior: Behavior normal.        Thought Content: Thought content normal.     Most recent functional status assessment:    08/24/2023   10:29 AM  In your present state of health, do you have any difficulty performing the following activities:  Hearing? 1  Vision? 0  Difficulty concentrating or making decisions? 0  Walking or climbing stairs? 0  Dressing or bathing? 0  Doing errands, shopping? 0  Preparing Food and eating ? N  Using the Toilet? N  In the past six months, have you accidently leaked urine? Y  Do you have problems with loss of bowel control? N   Managing your Medications? N  Managing your Finances? N  Housekeeping or managing your Housekeeping? N   Most recent fall risk assessment:    08/24/2023   10:32 AM  Fall Risk   Falls in the past year? 1  Number falls in past yr: 1  Injury with Fall? 1  Risk for fall due to : History of fall(s)  Follow up Falls evaluation completed    Most recent depression screenings:  08/24/2023   10:33 AM 08/22/2022   10:20 AM  PHQ 2/9 Scores  PHQ - 2 Score 0 0  PHQ- 9 Score  2   Most recent cognitive screening:    08/24/2023   10:33 AM  6CIT Screen  What Year? 0 points  What month? 0 points  What time? 0 points  Count back from 20 0 points  Months in reverse 0 points  Repeat phrase 0 points  Total Score 0 points   Most recent Audit-C alcohol use screening    08/24/2023    1:10 AM  Alcohol Use Disorder Test (AUDIT)  1. How often do you have a drink containing alcohol? 0   A score of 3 or more in women, and 4 or more in men indicates increased risk for alcohol abuse, EXCEPT if all of the points are from question 1   No results found.  No results found for any visits on 08/24/23.  Assessment & Plan     Annual wellness visit done today including the all of the following: Reviewed patient's Family Medical History Reviewed and updated list of patient's medical providers Assessment of cognitive impairment was done Assessed patient's functional ability Established a written schedule for health screening services Health Risk Assessent Completed and Reviewed  Exercise Activities and Dietary recommendations  Goals      Exercise 3x per week (30 min per time)     Recommend to start walking 3 days a week for at least 30 minutes at a time.         Immunization History  Administered Date(s) Administered   Fluad Quad(high Dose 65+) 04/29/2019, 07/27/2020, 08/17/2021, 08/22/2022   Influenza,inj,Quad PF,6+ Mos 06/26/2013, 04/24/2018   PFIZER(Purple Top)SARS-COV-2 Vaccination  04/29/2020, 08/19/2020   PNEUMOCOCCAL CONJUGATE-20 08/17/2021   Pneumococcal Conjugate-13 07/27/2020   Tdap 02/19/2019   Zoster Recombinant(Shingrix ) 02/19/2019, 04/29/2019    Health Maintenance  Topic Date Due   COVID-19 Vaccine (3 - 2024-25 season) 04/16/2023   INFLUENZA VACCINE  11/13/2023 (Originally 03/16/2023)   Colonoscopy  03/26/2024   Medicare Annual Wellness (AWV)  08/23/2024   MAMMOGRAM  10/27/2024   DTaP/Tdap/Td (2 - Td or Tdap) 02/18/2029   Pneumonia Vaccine 35+ Years old  Completed   DEXA SCAN  Completed   Hepatitis C Screening  Completed   Zoster Vaccines- Shingrix   Completed   HPV VACCINES  Aged Out     Discussed health benefits of physical activity, and encouraged her to engage in regular exercise appropriate for her age and condition.    Problem List Items Addressed This Visit       Endocrine   Thyroid  nodule   Small and stable Recheck TSH and thyroid  US  (continue x5 yrs total for stability)      Relevant Orders   US  THYROID    TSH     Other   Hypercholesteremia   Reviewed last lipid panel Not currently on a statin Recheck FLP and CMP Discussed diet and exercise       Relevant Orders   Comprehensive metabolic panel   Lipid panel   Avitaminosis D   Continue supplement Recheck level       Relevant Orders   VITAMIN D  25 Hydroxy (Vit-D Deficiency, Fractures)   Obesity, morbid (HCC)   Discussed importance of healthy weight management Discussed diet and exercise       Other Visit Diagnoses       Encounter for annual physical exam    -  Primary  Hyperglycemia       Relevant Orders   Hemoglobin A1c     Breast cancer screening by mammogram       Relevant Orders   MM 3D SCREENING MAMMOGRAM BILATERAL BREAST     Fall, initial encounter       Relevant Orders   DG Chest 2 View     Rib pain       Relevant Orders   DG Chest 2 View     Elevated BP without diagnosis of hypertension               Right-sided Rib Pain Reports severe  right-sided rib pain radiating to the back following a fall 8 days ago, exacerbated by breathing. No bruising noted. Differential diagnosis includes rib fracture. Discussed typical management of rib fractures, including pain management and use of incentive spirometry to prevent pneumonia. Informed about potential constipation with hydrocodone  and advised to use stool softeners. - Order chest x-ray - Prescribe hydrocodone  with acetaminophen , 1/2 to 1 pill every 6 hours as needed for pain, 5-day supply - Advise use of heating pad for pain relief - Instruct to avoid additional acetaminophen  while on hydrocodone   Hypertension Blood pressure slightly elevated, likely due to pain from rib injury. Rechecked at the end of the visit and remained slightly elevated, attributed to pain response. - continue to monitor  General Health Maintenance Up to date on pneumonia and shingles vaccinations. Tetanus vaccination current until 2030. Mammogram last done in March and due again in March. Thyroid  nodule surveillance in its fifth year; ultrasound needed to confirm stability. Colonoscopy due in August. Routine labs needed for kidney and liver function, cholesterol, thyroid , vitamin D , and A1c. - Administer flu shot - Order mammogram for March - Order thyroid  ultrasound - Remind of upcoming colonoscopy in August - Order routine labs including kidney and liver function, cholesterol, thyroid , vitamin D , and A1c  Follow-up - Inform of x-ray results via MyChart - Schedule follow-up visit in one year.        Return in about 1 year (around 08/23/2024) for CPE.     Jon Eva, MD  [facility] [phone] (phone) [phone] (fax)  [facility]

## 2023-08-24 NOTE — Assessment & Plan Note (Signed)
Small and stable Recheck TSH and thyroid US (continue x5 yrs total for stability)

## 2023-08-25 ENCOUNTER — Encounter: Payer: Self-pay | Admitting: Family Medicine

## 2023-08-25 ENCOUNTER — Other Ambulatory Visit: Payer: Self-pay | Admitting: *Deleted

## 2023-08-25 ENCOUNTER — Other Ambulatory Visit: Payer: Self-pay

## 2023-08-25 LAB — COMPREHENSIVE METABOLIC PANEL
ALT: 12 [IU]/L (ref 0–32)
AST: 12 [IU]/L (ref 0–40)
Albumin: 4.5 g/dL (ref 3.9–4.9)
Alkaline Phosphatase: 119 [IU]/L (ref 44–121)
BUN/Creatinine Ratio: 18 (ref 12–28)
BUN: 12 mg/dL (ref 8–27)
Bilirubin Total: 0.4 mg/dL (ref 0.0–1.2)
CO2: 22 mmol/L (ref 20–29)
Calcium: 9.8 mg/dL (ref 8.7–10.3)
Chloride: 105 mmol/L (ref 96–106)
Creatinine, Ser: 0.66 mg/dL (ref 0.57–1.00)
Globulin, Total: 1.8 g/dL (ref 1.5–4.5)
Glucose: 101 mg/dL — ABNORMAL HIGH (ref 70–99)
Potassium: 4.1 mmol/L (ref 3.5–5.2)
Sodium: 143 mmol/L (ref 134–144)
Total Protein: 6.3 g/dL (ref 6.0–8.5)
eGFR: 95 mL/min/{1.73_m2} (ref >59)

## 2023-08-25 LAB — LIPID PANEL
Chol/HDL Ratio: 3.2 {ratio} (ref 0.0–4.4)
Cholesterol, Total: 203 mg/dL — ABNORMAL HIGH (ref 100–199)
HDL: 63 mg/dL (ref >39)
LDL Chol Calc (NIH): 122 mg/dL — ABNORMAL HIGH (ref 0–99)
Triglycerides: 99 mg/dL (ref 0–149)
VLDL Cholesterol Cal: 18 mg/dL (ref 5–40)

## 2023-08-25 LAB — TSH: TSH: 2.13 u[IU]/mL (ref 0.450–4.500)

## 2023-08-25 LAB — HEMOGLOBIN A1C
Est. average glucose Bld gHb Est-mCnc: 108 mg/dL
Hgb A1c MFr Bld: 5.4 % (ref 4.8–5.6)

## 2023-08-25 LAB — VITAMIN D 25 HYDROXY (VIT D DEFICIENCY, FRACTURES): Vit D, 25-Hydroxy: 30.8 ng/mL (ref 30.0–100.0)

## 2023-08-25 MED ORDER — ROSUVASTATIN CALCIUM 5 MG PO TABS: 5.0000 mg | ORAL_TABLET | Freq: Every day | ORAL | 0 refills | Status: AC

## 2023-08-25 MED ORDER — ROSUVASTATIN CALCIUM 5 MG PO TABS: 5.0000 mg | ORAL_TABLET | Freq: Every day | ORAL | 3 refills | Status: DC

## 2023-08-28 ENCOUNTER — Encounter: Payer: Self-pay | Admitting: Family Medicine

## 2023-08-31 ENCOUNTER — Ambulatory Visit
Admission: RE | Admit: 2023-08-31 | Discharge: 2023-08-31 | Disposition: A | Discharge status: Home / Self Care | Payer: Medicare Other | Source: Ambulatory Visit | Attending: Family Medicine | Admitting: Family Medicine

## 2023-08-31 DIAGNOSIS — E041 Nontoxic single thyroid nodule: Secondary | ICD-10-CM | POA: Diagnosis present

## 2023-09-04 ENCOUNTER — Encounter: Payer: Self-pay | Admitting: Family Medicine

## 2023-10-15 ENCOUNTER — Other Ambulatory Visit: Payer: Self-pay | Admitting: Gastroenterology

## 2023-10-30 ENCOUNTER — Ambulatory Visit
Admission: RE | Admit: 2023-10-30 | Discharge: 2023-10-30 | Disposition: A | Discharge status: Home / Self Care | Payer: Medicare Other | Source: Ambulatory Visit | Attending: Family Medicine | Admitting: Family Medicine

## 2023-10-30 DIAGNOSIS — Z1231 Encounter for screening mammogram for malignant neoplasm of breast: Secondary | ICD-10-CM | POA: Insufficient documentation

## 2023-10-31 ENCOUNTER — Other Ambulatory Visit: Payer: Self-pay | Admitting: Family Medicine

## 2023-11-01 NOTE — Telephone Encounter (Signed)
 Requested Prescriptions  Pending Prescriptions Disp Refills   potassium chloride SA (KLOR-CON M20) 20 MEQ tablet [Pharmacy Med Name: KLOR-CON M20 TABLET] 90 tablet 1    Sig: TAKE 1 TABLET BY MOUTH EVERY DAY     Endocrinology:  Minerals - Potassium Supplementation Passed - 11/01/2023 10:41 AM      Passed - K in normal range and within 360 days    Potassium  Date Value Ref Range Status  08/24/2023 4.1 3.5 - 5.2 mmol/L Final         Passed - Cr in normal range and within 360 days    Creatinine, Ser  Date Value Ref Range Status  08/24/2023 0.66 0.57 - 1.00 mg/dL Final         Passed - Valid encounter within last 12 months    Recent Outpatient Visits           2 months ago Encounter for annual physical exam   [place] [facility] [place], Marzella Schlein, MD   6 months ago Positive self-administered antigen test for COVID-19   [facility] [place], Monico Blitz, DO   1 year ago Encounter for annual wellness visit (AWV) in Medicare patient   [place] [facility], Marzella Schlein, MD   2 years ago Fatigue, unspecified type   [facility], Marzella Schlein, MD   2 years ago Encounter for annual wellness visit (AWV) in Medicare patient   [place] [facility] [place], Marzella Schlein, MD       Future Appointments             In 4 weeks Vanga, Loel Dubonnet, MD [facility] [place]   In 9 months Bacigalupo, Marzella Schlein, MD [facility]

## 2023-11-29 ENCOUNTER — Ambulatory Visit: Payer: Medicare Other | Admitting: Gastroenterology

## 2023-11-29 ENCOUNTER — Encounter: Payer: Self-pay | Admitting: Gastroenterology

## 2023-11-29 ENCOUNTER — Other Ambulatory Visit: Payer: Self-pay

## 2023-11-29 VITALS — BP 150/90 | HR 72 | Temp 97.9°F | Ht 64.0 in | Wt 230.2 lb

## 2023-11-29 DIAGNOSIS — K219 Gastro-esophageal reflux disease without esophagitis: Secondary | ICD-10-CM | POA: Diagnosis not present

## 2023-11-29 DIAGNOSIS — Z800 Family history of malignant neoplasm of digestive organs: Secondary | ICD-10-CM

## 2023-11-29 DIAGNOSIS — Z1211 Encounter for screening for malignant neoplasm of colon: Secondary | ICD-10-CM

## 2023-11-29 MED ORDER — NA SULFATE-K SULFATE-MG SULF 17.5-3.13-1.6 GM/177ML PO SOLN: 354.0000 mL | Freq: Once | ORAL | 0 refills | Status: AC

## 2023-11-29 NOTE — Progress Notes (Signed)
 Arlyss Repress, MD [address]  [address]  [place], [place] [address]  Main: [phone]  Fax: [phone]    Gastroenterology Consultation  Referring Provider:     Erasmo Downer, MD Primary Care Physician:  Erasmo Downer, MD Primary Gastroenterologist:  Dr. Arlyss Repress Reason for Consultation:     Chronic GERD        HPI:   Elaine Melendez is a 68 y.o. female referred by Dr. Beryle Flock, Marzella Schlein, MD  for consultation & management of chronic GERD.  Patient is taking Protonix 40 mg daily before meals which keeps her reflux symptoms under control.  She tried to reduce the dose to 20 mg daily but her symptoms recurred.  So, she has been maintaining 40 mg daily.  She does not have any other concerns today  NSAIDs: None  Antiplts/Anticoagulants/Anti thrombotics: None  GI Procedures:  Colonoscopy 03/27/2019 - One 5 mm polyp in the transverse colon, removed with a cold snare. Resected and retrieved. Clip was placed. - Medium- sized lipoma in the descending colon. Biopsied. - Four 3 to 4 mm polyps in the rectum, in the sigmoid colon and in the descending colon, removed with a cold biopsy forceps. Resected and retrieved. - The examination was otherwise normal. - The rectum, sigmoid colon, descending colon, transverse colon, ascending colon and cecum are normal. - The distal rectum and anal verge are normal on retroflexion view.  Past Medical History:  Diagnosis Date   Anxiety    Arthritis Shoulder   Cannot sleep 05/10/2017   Cataract    Dense breast tissue 05/25/2017   Dysrhythmia    remote hs tachycardia>15 yrs ago, no meds   GERD (gastroesophageal reflux disease)    Headache    Hypercholesteremia 01/31/2007   Joint effusion 12/05/2017   Left breast mass 06/21/2017   Meniere's disease    Oxygen deficiency    Polyarthralgia 12/05/2017   Sleep apnea May   Cpap    Past Surgical History:  Procedure Laterality Date   ANKLE FRACTURE SURGERY Right     BREAST BIOPSY Left 09/14/2021   left breast stereo, coil marker, PASH   BREAST EXCISIONAL BIOPSY Left 09/11/2018   neg high risk lesion   CHOLECYSTECTOMY     COLONOSCOPY WITH PROPOFOL N/A 03/27/2019   Procedure: COLONOSCOPY WITH PROPOFOL;  Surgeon: Pasty Spillers, MD;  Location: [facility];  Service: Gastroenterology;  Laterality: N/A;   ENDOBRONCHIAL ULTRASOUND N/A 02/27/2018   Procedure: ENDOBRONCHIAL ULTRASOUND;  Surgeon: Shane Crutch, MD;  Location: [facility];  Service: Pulmonary;  Laterality: N/A;   FRACTURE SURGERY     JOINT REPLACEMENT  Shoulder   RADIOACTIVE SEED GUIDED EXCISIONAL BREAST BIOPSY Left 09/12/2017   Procedure: 2 RADIOACTIVE SEED GUIDED EXCISIONAL LEFT BREAST BIOPSY ERAS PATHWAY;  Surgeon: Almond Lint, MD;  Location: [facility];  Service: General;  Laterality: Left;   TOTAL SHOULDER REPLACEMENT     TUBAL LIGATION       Current Outpatient Medications:    albuterol (VENTOLIN HFA) 108 (90 Base) MCG/ACT inhaler, TAKE 2 PUFFS BY MOUTH EVERY 6 HOURS AS NEEDED FOR WHEEZE OR SHORTNESS OF BREATH, Disp: 8.5 each, Rfl: 2   celecoxib (CELEBREX) 200 MG capsule, Take 200 mg by mouth 2 (two) times daily as needed., Disp: , Rfl:    Cyanocobalamin (VITAMIN B12 PO), Take by mouth., Disp: , Rfl:    dimenhyDRINATE (DRAMAMINE) 50 MG tablet, Take 50 mg by mouth every 8 (eight) hours  as needed., Disp: , Rfl:    fluticasone (FLONASE) 50 MCG/ACT nasal spray, SPRAY 2 SPRAYS INTO EACH NOSTRIL EVERY DAY, Disp: 48 mL, Rfl: 2   Na Sulfate-K Sulfate-Mg Sulfate concentrate (SUPREP) 17.5-3.13-1.6 GM/177ML SOLN, Take 1 kit (354 mLs total) by mouth once for 1 dose., Disp: 354 mL, Rfl: 0   nystatin (MYCOSTATIN/NYSTOP) powder, Apply topically 3 (three) times daily., Disp: 30 g, Rfl: 3   pantoprazole (PROTONIX) 40 MG tablet, TAKE 1 TABLET BY MOUTH EVERYDAY AT BEDTIME, Disp: 90 tablet, Rfl: 0   potassium chloride SA (KLOR-CON M20) 20 MEQ tablet, TAKE 1 TABLET BY MOUTH EVERY  DAY, Disp: 90 tablet, Rfl: 1   rosuvastatin (CRESTOR) 5 MG tablet, Take 1 tablet (5 mg total) by mouth daily., Disp: 90 tablet, Rfl: 0   SUMAtriptan (IMITREX) 50 MG tablet, Take 1 tablet (50 mg total) by mouth every 2 (two) hours as needed for migraine., Disp: 10 tablet, Rfl: 2   venlafaxine (EFFEXOR) 75 MG tablet, TAKE 1 TABLET BY MOUTH EVERY DAY, Disp: 90 tablet, Rfl: 1   VITAMIN D PO, Take by mouth daily., Disp: , Rfl:     Family History  Problem Relation Age of Onset   Alzheimer's disease Mother    Colon polyps Mother        cancerous; partial colon removal   Arthritis Mother    Cancer Mother    Stroke Mother    Squamous cell carcinoma Father    Hypertension Father    Heart attack Father    Heart disease Father    Breast cancer Paternal Aunt    Breast cancer Maternal Grandmother      Social History   Tobacco Use   Smoking status: Never   Smokeless tobacco: Never  Vaping Use   Vaping status: Never Used  Substance Use Topics   Alcohol use: No   Drug use: Never    Allergies as of 11/29/2023 - Review Complete 11/29/2023  Allergen Reaction Noted   Nickel Rash 01/31/2007    Review of Systems:    All systems reviewed and negative except where noted in HPI.   Physical Exam:  BP (!) 150/90 (BP Location: Left Arm, Patient Position: Sitting, Cuff Size: Large)   Pulse 72   Temp 97.9 F (36.6 C) (Oral)   Ht 5\' 4"  (1.626 m)   Wt 230 lb 4 oz (104.4 kg)   LMP  (LMP Unknown)   BMI 39.52 kg/m  No LMP recorded (lmp unknown). Patient is postmenopausal.  General:   Alert,  Well-developed, well-nourished, pleasant and cooperative in NAD Head:  Normocephalic and atraumatic. Eyes:  Sclera clear, no icterus.   Conjunctiva pink. Ears:  Normal auditory acuity. Nose:  No deformity, discharge, or lesions. Mouth:  No deformity or lesions,oropharynx pink & moist. Neck:  Supple; no masses or thyromegaly. Lungs:  Respirations even and unlabored.  Clear throughout to auscultation.    No wheezes, crackles, or rhonchi. No acute distress. Heart:  Regular rate and rhythm; no murmurs, clicks, rubs, or gallops. Abdomen:  Normal bowel sounds. Soft, non-tender and non-distended without masses, hepatosplenomegaly or hernias noted.  No guarding or rebound tenderness.   Rectal: Not performed Msk:  Symmetrical without gross deformities. Good, equal movement & strength bilaterally. Pulses:  Normal pulses noted. Extremities:  No clubbing or edema.  No cyanosis. Neurologic:  Alert and oriented x3;  grossly normal neurologically. Skin:  Intact without significant lesions or rashes. No jaundice. Psych:  Alert and cooperative. Normal mood and affect.  Imaging Studies: No abdominal imaging  Assessment and Plan:   Elaine Melendez is a 68 y.o. female with obesity, chronic GERD under control on Protonix  Chronic GERD without esophagitis Continue Protonix 40 mg daily before breakfast  Colon cancer screening, family history of colon cancer in her mother in 63s Recommend screening colonoscopy   Follow up as needed   Karma Oz, MD

## 2023-11-30 ENCOUNTER — Other Ambulatory Visit: Payer: Self-pay | Admitting: Family Medicine

## 2023-12-05 ENCOUNTER — Other Ambulatory Visit: Payer: Self-pay | Admitting: Family Medicine

## 2023-12-05 NOTE — Telephone Encounter (Signed)
 Last OV 08/24/23 Requested Prescriptions  Pending Prescriptions Disp Refills   venlafaxine  (EFFEXOR ) 75 MG tablet [Pharmacy Med Name: VENLAFAXINE  HCL 75 MG TABLET] 90 tablet 1    Sig: TAKE 1 TABLET BY MOUTH EVERY DAY     Psychiatry: Antidepressants - SNRI - desvenlafaxine & venlafaxine  Failed - 12/05/2023  4:31 PM      Failed - Last BP in normal range    BP Readings from Last 1 Encounters:  11/29/23 (!) 150/90         Failed - Valid encounter within last 6 months    Recent Outpatient Visits   None     Future Appointments             In 8 months Bacigalupo, Stan Eans, MD [facility]            Failed - Lipid Panel in normal range within the last 12 months    Cholesterol, Total  Date Value Ref Range Status  08/24/2023 203 (H) 100 - 199 mg/dL Final   LDL Chol Calc (NIH)  Date Value Ref Range Status  08/24/2023 122 (H) 0 - 99 mg/dL Final   HDL  Date Value Ref Range Status  08/24/2023 63 >39 mg/dL Final   Triglycerides  Date Value Ref Range Status  08/24/2023 99 0 - 149 mg/dL Final         Passed - Cr in normal range and within 360 days    Creatinine, Ser  Date Value Ref Range Status  08/24/2023 0.66 0.57 - 1.00 mg/dL Final         Passed - Completed PHQ-2 or PHQ-9 in the last 360 days

## 2023-12-20 ENCOUNTER — Encounter (HOSPITAL_COMMUNITY): Payer: Self-pay

## 2023-12-27 ENCOUNTER — Ambulatory Visit: Admitting: Anesthesiology

## 2023-12-27 ENCOUNTER — Encounter: Payer: Self-pay | Admitting: Gastroenterology

## 2023-12-27 ENCOUNTER — Ambulatory Visit
Admission: RE | Admit: 2023-12-27 | Discharge: 2023-12-27 | Disposition: A | Discharge status: Home / Self Care | Attending: Gastroenterology | Admitting: Gastroenterology

## 2023-12-27 ENCOUNTER — Encounter
Admission: RE | Disposition: A | Discharge status: Home / Self Care | Payer: Self-pay | Source: Home / Self Care | Attending: Gastroenterology

## 2023-12-27 ENCOUNTER — Other Ambulatory Visit: Payer: Self-pay

## 2023-12-27 DIAGNOSIS — Z1211 Encounter for screening for malignant neoplasm of colon: Secondary | ICD-10-CM | POA: Diagnosis not present

## 2023-12-27 DIAGNOSIS — G4730 Sleep apnea, unspecified: Secondary | ICD-10-CM | POA: Diagnosis not present

## 2023-12-27 DIAGNOSIS — D175 Benign lipomatous neoplasm of intra-abdominal organs: Secondary | ICD-10-CM | POA: Insufficient documentation

## 2023-12-27 DIAGNOSIS — K219 Gastro-esophageal reflux disease without esophagitis: Secondary | ICD-10-CM | POA: Insufficient documentation

## 2023-12-27 DIAGNOSIS — F418 Other specified anxiety disorders: Secondary | ICD-10-CM | POA: Diagnosis not present

## 2023-12-27 DIAGNOSIS — Z800 Family history of malignant neoplasm of digestive organs: Secondary | ICD-10-CM | POA: Diagnosis not present

## 2023-12-27 HISTORY — PX: COLONOSCOPY: SHX5424

## 2023-12-27 SURGERY — COLONOSCOPY
Anesthesia: General

## 2023-12-27 MED ORDER — LIDOCAINE HCL (CARDIAC) PF 100 MG/5ML IV SOSY
PREFILLED_SYRINGE | INTRAVENOUS | Status: DC | PRN
  Administered 2023-12-27: 60 mg via INTRAVENOUS

## 2023-12-27 MED ORDER — SODIUM CHLORIDE 0.9 % IV SOLN: INTRAVENOUS | Status: DC

## 2023-12-27 MED ORDER — DEXMEDETOMIDINE HCL IN NACL 80 MCG/20ML IV SOLN
INTRAVENOUS | Status: DC | PRN
  Administered 2023-12-27: 4 ug via INTRAVENOUS

## 2023-12-27 MED ORDER — LIDOCAINE HCL (PF) 2 % IJ SOLN
INTRAMUSCULAR | Status: AC
  Filled 2023-12-27: qty 5

## 2023-12-27 MED ORDER — STERILE WATER FOR IRRIGATION IR SOLN
Status: DC | PRN
  Administered 2023-12-27: 120 mL

## 2023-12-27 MED ORDER — PROPOFOL 10 MG/ML IV BOLUS
INTRAVENOUS | Status: DC | PRN
  Administered 2023-12-27: 50 mg via INTRAVENOUS
  Administered 2023-12-27: 30 mg via INTRAVENOUS
  Administered 2023-12-27: 50 mg via INTRAVENOUS
  Administered 2023-12-27: 100 mg via INTRAVENOUS

## 2023-12-27 MED ORDER — PROPOFOL 10 MG/ML IV BOLUS
INTRAVENOUS | Status: AC
  Filled 2023-12-27: qty 40

## 2023-12-27 NOTE — Anesthesia Preprocedure Evaluation (Signed)
 Anesthesia Evaluation  Patient identified by MRN, date of birth, ID band Patient awake    Reviewed: Allergy & Precautions, NPO status , Patient's Chart, lab work & pertinent test results  History of Anesthesia Complications Negative for: history of anesthetic complications  Airway Mallampati: III   Neck ROM: Full    Dental no notable dental hx.    Pulmonary sleep apnea and Continuous Positive Airway Pressure Ventilation    Pulmonary exam normal breath sounds clear to auscultation       Cardiovascular negative cardio ROS Normal cardiovascular exam Rhythm:Regular Rate:Normal     Neuro/Psych  Headaches PSYCHIATRIC DISORDERS Anxiety Depression    Meniere disease    GI/Hepatic ,GERD  ,,  Endo/Other  Obesity   Renal/GU negative Renal ROS     Musculoskeletal  (+) Arthritis ,    Abdominal   Peds  Hematology negative hematology ROS (+)   Anesthesia Other Findings   Reproductive/Obstetrics                             Anesthesia Physical Anesthesia Plan  ASA: 2  Anesthesia Plan: General   Post-op Pain Management:    Induction: Intravenous  PONV Risk Score and Plan: 3 and Propofol  infusion, TIVA and Treatment may vary due to age or medical condition  Airway Management Planned: Natural Airway  Additional Equipment:   Intra-op Plan:   Post-operative Plan:   Informed Consent: I have reviewed the patients History and Physical, chart, labs and discussed the procedure including the risks, benefits and alternatives for the proposed anesthesia with the patient or authorized representative who has indicated his/her understanding and acceptance.       Plan Discussed with: CRNA  Anesthesia Plan Comments: (LMA/GETA backup discussed.  Patient consented for risks of anesthesia including but not limited to:  - adverse reactions to medications - damage to eyes, teeth, lips or other oral  mucosa - nerve damage due to positioning  - sore throat or hoarseness - damage to heart, brain, nerves, lungs, other parts of body or loss of life  Informed patient about role of CRNA in peri- and intra-operative care.  Patient voiced understanding.)       Anesthesia Quick Evaluation

## 2023-12-27 NOTE — Anesthesia Postprocedure Evaluation (Signed)
 Anesthesia Post Note  Patient: Elaine Melendez  Procedure(s) Performed: COLONOSCOPY  Patient location during evaluation: PACU Anesthesia Type: General Level of consciousness: awake and alert, oriented and patient cooperative Pain management: pain level controlled Vital Signs Assessment: post-procedure vital signs reviewed and stable Respiratory status: spontaneous breathing, nonlabored ventilation and respiratory function stable Cardiovascular status: blood pressure returned to baseline and stable Postop Assessment: adequate PO intake Anesthetic complications: no   No notable events documented.   Last Vitals:  Vitals:   12/27/23 1004 12/27/23 1013  BP: 120/72 133/68  Pulse:    Resp:    Temp: (!) 36.3 C   SpO2:      Last Pain:  Vitals:   12/27/23 1013  TempSrc:   PainSc: 0-No pain                 [name]

## 2023-12-27 NOTE — Anesthesia Procedure Notes (Signed)
 Procedure Name: General with mask airway Date/Time: 12/27/2023 9:52 AM  Performed by: Carolynn Citrin, CRNAPre-anesthesia Checklist: Patient identified, Emergency Drugs available, Suction available, Patient being monitored and Timeout performed Oxygen Delivery Method: Simple face mask Induction Type: IV induction Placement Confirmation: positive ETCO2

## 2023-12-27 NOTE — H&P (Signed)
 Karma Oz, MD [address]  [address]  [place], [place] [address]  Main: [phone]  Fax: [phone] Pager: [phone]  Primary Care Physician:  Mazie Speed, MD Primary Gastroenterologist:  Dr. Karma Oz  Pre-Procedure History & Physical: HPI:  Ed Zimmerly is a 68 y.o. female is here for an colonoscopy.   Past Medical History:  Diagnosis Date   Anxiety    Arthritis Shoulder   Cannot sleep 05/10/2017   Cataract    Dense breast tissue 05/25/2017   Dysrhythmia    remote hs tachycardia>15 yrs ago, no meds   GERD (gastroesophageal reflux disease)    Headache    Hypercholesteremia 01/31/2007   Joint effusion 12/05/2017   Left breast mass 06/21/2017   Meniere's disease    Oxygen deficiency    Polyarthralgia 12/05/2017   Sleep apnea May   Cpap    Past Surgical History:  Procedure Laterality Date   ANKLE FRACTURE SURGERY Right    BREAST BIOPSY Left 09/14/2021   left breast stereo, coil marker, PASH   BREAST EXCISIONAL BIOPSY Left 09/11/2018   neg high risk lesion   CHOLECYSTECTOMY     COLONOSCOPY WITH PROPOFOL  N/A 03/27/2019   Procedure: COLONOSCOPY WITH PROPOFOL ;  Surgeon: Irby Mannan, MD;  Location: [facility];  Service: Gastroenterology;  Laterality: N/A;   ENDOBRONCHIAL ULTRASOUND N/A 02/27/2018   Procedure: ENDOBRONCHIAL ULTRASOUND;  Surgeon: Enos Harts, MD;  Location: [facility];  Service: Pulmonary;  Laterality: N/A;   FRACTURE SURGERY     JOINT REPLACEMENT  Shoulder   RADIOACTIVE SEED GUIDED EXCISIONAL BREAST BIOPSY Left 09/12/2017   Procedure: 2 RADIOACTIVE SEED GUIDED EXCISIONAL LEFT BREAST BIOPSY ERAS PATHWAY;  Surgeon: Lockie Rima, MD;  Location: [facility];  Service: General;  Laterality: Left;   TOTAL SHOULDER REPLACEMENT     TUBAL LIGATION      Prior to Admission medications   Medication Sig Start Date End Date Taking? Authorizing Provider  albuterol  (VENTOLIN  HFA) 108 (90 Base)  MCG/ACT inhaler TAKE 2 PUFFS BY MOUTH EVERY 6 HOURS AS NEEDED FOR WHEEZE OR SHORTNESS OF BREATH 05/01/23   Bacigalupo, Stan Eans, MD  celecoxib  (CELEBREX ) 200 MG capsule Take 200 mg by mouth 2 (two) times daily as needed. 06/28/21   [provider]  Cyanocobalamin (VITAMIN B12 PO) Take by mouth.    [provider]  dimenhyDRINATE (DRAMAMINE) 50 MG tablet Take 50 mg by mouth every 8 (eight) hours as needed.    [provider]  fluticasone  (FLONASE ) 50 MCG/ACT nasal spray SPRAY 2 SPRAYS INTO EACH NOSTRIL EVERY DAY 11/30/23   Bacigalupo, Angela M, MD  nystatin  (MYCOSTATIN /NYSTOP ) powder Apply topically 3 (three) times daily. 02/19/19   Bacigalupo, Angela M, MD  pantoprazole  (PROTONIX ) 40 MG tablet TAKE 1 TABLET BY MOUTH EVERYDAY AT BEDTIME 10/16/23   Marnee Sink, MD  potassium chloride  SA (KLOR-CON  M20) 20 MEQ tablet TAKE 1 TABLET BY MOUTH EVERY DAY 11/01/23   Bacigalupo, Stan Eans, MD  rosuvastatin  (CRESTOR ) 5 MG tablet Take 1 tablet (5 mg total) by mouth daily. 08/25/23   Bacigalupo, Angela M, MD  SUMAtriptan (IMITREX) 50 MG tablet Take 1 tablet (50 mg total) by mouth every 2 (two) hours as needed for migraine. 08/17/21   Bacigalupo, Angela M, MD  venlafaxine  (EFFEXOR ) 75 MG tablet TAKE 1 TABLET BY MOUTH EVERY DAY 12/05/23   Bacigalupo, Angela M, MD  VITAMIN D  PO Take by mouth daily.    [provider]  Allergies as of 11/29/2023 - Review Complete 11/29/2023  Allergen Reaction Noted   Nickel Rash 01/31/2007    Family History  Problem Relation Age of Onset   Alzheimer's disease Mother    Colon polyps Mother        cancerous; partial colon removal   Arthritis Mother    Cancer Mother    Stroke Mother    Squamous cell carcinoma Father    Hypertension Father    Heart attack Father    Heart disease Father    Breast cancer Paternal Aunt    Breast cancer Maternal Grandmother     Social History   Socioeconomic History   Marital status: Married    Spouse name:  Marquite Torma   Number of children: 2   Years of education: 14   Highest education level: Associate degree: occupational, Scientist, product/process development, or vocational program  Occupational History   Occupation: retired  Tobacco Use   Smoking status: Never   Smokeless tobacco: Never  Vaping Use   Vaping status: Never Used  Substance and Sexual Activity   Alcohol use: No   Drug use: Never   Sexual activity: Yes    Partners: Male    Birth control/protection: Post-menopausal  Other Topics Concern   Not on file  Social History Narrative   Not on file   Social Drivers of Health   Financial Resource Strain: Low Risk  (08/24/2023)   Overall Financial Resource Strain (CARDIA)    Difficulty of Paying Living Expenses: Not hard at all  Food Insecurity: No Food Insecurity (08/24/2023)   Hunger Vital Sign    Worried About Running Out of Food in the Last Year: Never true    Ran Out of Food in the Last Year: Never true  Transportation Needs: No Transportation Needs (08/24/2023)   PRAPARE - Administrator, Civil Service (Medical): No    Lack of Transportation (Non-Medical): No  Physical Activity: Sufficiently Active (08/24/2023)   Exercise Vital Sign    Days of Exercise per Week: 4 days    Minutes of Exercise per Session: 40 min  Stress: No Stress Concern Present (08/24/2023)   [employer] of Occupational Health - Occupational Stress Questionnaire    Feeling of Stress : Not at all  Social Connections: Moderately Integrated (08/24/2023)   Social Connection and Isolation Panel [NHANES]    Frequency of Communication with Friends and Family: More than three times a week    Frequency of Social Gatherings with Friends and Family: Three times a week    Attends Religious Services: More than 4 times per year    Active Member of Clubs or Organizations: No    Attends Banker Meetings: Not on file    Marital Status: Married  Intimate Partner Violence: Not At Risk (08/24/2023)   Humiliation, Afraid,  Rape, and Kick questionnaire    Fear of Current or Ex-Partner: No    Emotionally Abused: No    Physically Abused: No    Sexually Abused: No    Review of Systems: See HPI, otherwise negative ROS  Physical Exam: BP (!) 160/86   Pulse (!) 101   Temp (!) 97.4 F (36.3 C) (Temporal)   Resp 18   Ht 5\' 4"  (1.626 m)   Wt 101.9 kg   LMP  (LMP Unknown)   SpO2 97%   BMI 38.55 kg/m  General:   Alert,  pleasant and cooperative in NAD Head:  Normocephalic and atraumatic. Neck:  Supple; no masses or  thyromegaly. Lungs:  Clear throughout to auscultation.    Heart:  Regular rate and rhythm. Abdomen:  Soft, nontender and nondistended. Normal bowel sounds, without guarding, and without rebound.   Neurologic:  Alert and  oriented x4;  grossly normal neurologically.  Impression/Plan: Marisela Diane Games is here for an colonoscopy to be performed for colon cancer screening  Risks, benefits, limitations, and alternatives regarding  colonoscopy have been reviewed with the patient.  Questions have been answered.  All parties agreeable.   Ellis Guys, MD  12/27/2023, 8:59 AM

## 2023-12-27 NOTE — Op Note (Signed)
 [facility] Gastroenterology Patient Name: Elaine Melendez Procedure Date: 12/27/2023 9:33 AM MRN: [id number] Account #: [id number] Date of Birth: [birth year] Admit Type: Outpatient Age: 68 Room: [facility] ENDO ROOM 4 Gender: Female Note Status: Finalized Instrument Name: Charlyn Cooley [id number] Procedure:             Colonoscopy Indications:           Screening in patient at increased risk: Colorectal                         cancer in mother 42 or older, Last colonoscopy: August                         2020 Providers:             Selena Daily MD, MD Referring MD:          Stan Eans. Bacigalupo (Referring MD) Medicines:             General Anesthesia Complications:         No immediate complications. Estimated blood loss: None. Procedure:             Pre-Anesthesia Assessment:                        - Prior to the procedure, a History and Physical was                         performed, and patient medications and allergies were                         reviewed. The patient is competent. The risks and                         benefits of the procedure and the sedation options and                         risks were discussed with the patient. All questions                         were answered and informed consent was obtained.                         Patient identification and proposed procedure were                         verified by the physician, the nurse, the                         anesthesiologist, the anesthetist and the technician                         in the pre-procedure area in the procedure room in the                         endoscopy suite. Mental Status Examination: alert and                         oriented. Airway Examination: normal oropharyngeal  airway and neck mobility. Respiratory Examination:                         clear to auscultation. CV Examination: normal.                         Prophylactic Antibiotics: The patient  does not require                         prophylactic antibiotics. Prior Anticoagulants: The                         patient has taken no anticoagulant or antiplatelet                         agents. ASA Grade Assessment: II - A patient with mild                         systemic disease. After reviewing the risks and                         benefits, the patient was deemed in satisfactory                         condition to undergo the procedure. The anesthesia                         plan was to use general anesthesia. Immediately prior                         to administration of medications, the patient was                         re-assessed for adequacy to receive sedatives. The                         heart rate, respiratory rate, oxygen saturations,                         blood pressure, adequacy of pulmonary ventilation, and                         response to care were monitored throughout the                         procedure. The physical status of the patient was                         re-assessed after the procedure.                        After obtaining informed consent, the colonoscope was                         passed under direct vision. Throughout the procedure,                         the patient's blood pressure, pulse, and oxygen  saturations were monitored continuously. The                         Colonoscope was introduced through the anus and                         advanced to the the cecum, identified by appendiceal                         orifice and ileocecal valve. The colonoscopy was                         performed without difficulty. The patient tolerated                         the procedure well. The quality of the bowel                         preparation was evaluated using the BBPS [facility] Bowel                         Preparation Scale) with scores of: Right Colon = 3,                         Transverse Colon = 3 and Left  Colon = 3 (entire mucosa                         seen well with no residual staining, small fragments                         of stool or opaque liquid). The total BBPS score                         equals 9. The ileocecal valve, appendiceal orifice,                         and rectum were photographed. Findings:      The perianal and digital rectal examinations were normal. Pertinent       negatives include normal sphincter tone, no palpable rectal lesions and       normal prostate (size, shape, and consistency).      The entire examined colon appeared normal.      The retroflexed view of the distal rectum and anal verge was normal and       showed no anal or rectal abnormalities.      There was a medium-sized lipoma, in the transverse colon. Impression:            - The entire examined colon is normal.                        - The distal rectum and anal verge are normal on                         retroflexion view.                        - Medium-sized lipoma in the transverse colon.                        -  No specimens collected. Recommendation:        - Discharge patient to home (with escort).                        - Resume previous diet today.                        - Continue present medications.                        - Repeat colonoscopy in 10 years for screening                         purposes. Procedure Code(s):     --- Professional ---                        W0981, Colorectal cancer screening; colonoscopy on                         individual at high risk Diagnosis Code(s):     --- Professional ---                        Z80.0, Family history of malignant neoplasm of                         digestive organs CPT copyright 2022 American Medical Association. All rights reserved. The codes documented in this report are preliminary and upon coder review may  be revised to meet current compliance requirements. Dr. Evia Hof Selena Daily MD, MD 12/27/2023 10:00:23  AM This report has been signed electronically. Number of Addenda: 0 Note Initiated On: 12/27/2023 9:33 AM Scope Withdrawal Time: 0 hours 6 minutes 48 seconds  Total Procedure Duration: 0 hours 13 minutes 4 seconds  Estimated Blood Loss:  Estimated blood loss: none.      [facility]

## 2023-12-27 NOTE — Transfer of Care (Signed)
 Immediate Anesthesia Transfer of Care Note  Patient: Elaine Melendez  Procedure(s) Performed: COLONOSCOPY  Patient Location: PACU and Endoscopy Unit  Anesthesia Type:General  Level of Consciousness: awake, alert , oriented, and patient cooperative  Airway & Oxygen Therapy: Patient Spontanous Breathing  Post-op Assessment: Report given to RN and Post -op Vital signs reviewed and stable  Post vital signs: Reviewed and stable  Last Vitals:  Vitals Value Taken Time  BP 120/72 12/27/23 1004  Temp    Pulse 77 12/27/23 1004  Resp 17 12/27/23 1004  SpO2 99 % 12/27/23 1004  Vitals shown include unfiled device data.  Last Pain:  Vitals:   12/27/23 0857  TempSrc: Temporal  PainSc: 0-No pain         Complications: No notable events documented.

## 2023-12-28 ENCOUNTER — Encounter: Payer: Self-pay | Admitting: Gastroenterology

## 2023-12-30 HISTORY — PX: TOTAL SHOULDER REPLACEMENT: SUR1217

## 2024-01-24 ENCOUNTER — Other Ambulatory Visit: Payer: Self-pay | Admitting: Gastroenterology

## 2024-03-13 ENCOUNTER — Other Ambulatory Visit: Payer: Self-pay | Admitting: Family Medicine

## 2024-03-26 ENCOUNTER — Ambulatory Visit: Payer: Self-pay

## 2024-03-26 ENCOUNTER — Emergency Department
Admission: EM | Admit: 2024-03-26 | Discharge: 2024-03-27 | Disposition: A | Discharge status: Home / Self Care | Source: Ambulatory Visit | Attending: Emergency Medicine | Admitting: Emergency Medicine

## 2024-03-26 DIAGNOSIS — R197 Diarrhea, unspecified: Secondary | ICD-10-CM | POA: Insufficient documentation

## 2024-03-26 DIAGNOSIS — E876 Hypokalemia: Secondary | ICD-10-CM | POA: Insufficient documentation

## 2024-03-26 DIAGNOSIS — R112 Nausea with vomiting, unspecified: Secondary | ICD-10-CM | POA: Diagnosis present

## 2024-03-26 LAB — CBC
HCT: 44.9 % (ref 36.0–46.0)
Hemoglobin: 15.1 g/dL — ABNORMAL HIGH (ref 12.0–15.0)
MCH: 29.7 pg (ref 26.0–34.0)
MCHC: 33.6 g/dL (ref 30.0–36.0)
MCV: 88.2 fL (ref 80.0–100.0)
Platelets: 299 K/uL (ref 150–400)
RBC: 5.09 MIL/uL (ref 3.87–5.11)
RDW: 12 % (ref 11.5–15.5)
WBC: 10.5 K/uL (ref 4.0–10.5)
nRBC: 0 % (ref 0.0–0.2)

## 2024-03-26 LAB — LIPASE, BLOOD: Lipase: 49 U/L (ref 11–51)

## 2024-03-26 LAB — COMPREHENSIVE METABOLIC PANEL WITH GFR
ALT: 9 U/L (ref 0–44)
AST: 15 U/L (ref 15–41)
Albumin: 4.1 g/dL (ref 3.5–5.0)
Alkaline Phosphatase: 90 U/L (ref 38–126)
Anion gap: 13 (ref 5–15)
BUN: 8 mg/dL (ref 8–23)
CO2: 22 mmol/L (ref 22–32)
Calcium: 9.7 mg/dL (ref 8.9–10.3)
Chloride: 103 mmol/L (ref 98–111)
Creatinine, Ser: 0.67 mg/dL (ref 0.44–1.00)
GFR, Estimated: >60 mL/min (ref >60)
Glucose, Bld: 127 mg/dL — ABNORMAL HIGH (ref 70–99)
Potassium: 2.9 mmol/L — ABNORMAL LOW (ref 3.5–5.1)
Sodium: 138 mmol/L (ref 135–145)
Total Bilirubin: 0.8 mg/dL (ref 0.0–1.2)
Total Protein: 7.2 g/dL (ref 6.5–8.1)

## 2024-03-26 LAB — MAGNESIUM: Magnesium: 2 mg/dL (ref 1.7–2.4)

## 2024-03-26 MED ORDER — POTASSIUM CHLORIDE 10 MEQ/100ML IV SOLN
10.0000 meq | INTRAVENOUS | Status: AC
  Administered 2024-03-27 (×4): 10 meq via INTRAVENOUS
  Filled 2024-03-26 (×2): qty 100

## 2024-03-26 MED ORDER — ONDANSETRON HCL 4 MG/2ML IJ SOLN
4.0000 mg | Freq: Once | INTRAMUSCULAR | Status: AC
  Administered 2024-03-26 (×2): 4 mg via INTRAVENOUS
  Filled 2024-03-26: qty 2

## 2024-03-26 MED ORDER — SODIUM CHLORIDE 0.9 % IV BOLUS
1000.0000 mL | Freq: Once | INTRAVENOUS | Status: AC
  Administered 2024-03-26 (×2): 1000 mL via INTRAVENOUS

## 2024-03-26 MED ORDER — POTASSIUM CHLORIDE CRYS ER 20 MEQ PO TBCR
40.0000 meq | EXTENDED_RELEASE_TABLET | Freq: Once | ORAL | Status: AC
  Administered 2024-03-27 (×2): 40 meq via ORAL
  Filled 2024-03-26: qty 2

## 2024-03-26 MED ORDER — DIAZEPAM 5 MG PO TABS
5.0000 mg | ORAL_TABLET | Freq: Once | ORAL | Status: AC
  Administered 2024-03-27 (×2): 5 mg via ORAL
  Filled 2024-03-26: qty 1

## 2024-03-26 NOTE — ED Provider Triage Note (Addendum)
 Emergency Medicine Provider Triage Evaluation Note  Elaine Melendez , a 68 y.o. female  was evaluated in triage.  Pt complains of history of weakness.  With the patient she had left shoulder surgery 2 weeks ago she was taking oxycodone  and is stopped that 3 days ago.  2 days ago patient had nauseous and vomited x 4.  Last 24 hours patient has liquid diarrhea without blood in his stools.  Denies feeling dizzy, weak, dry mouth.  Patient denies urinary symptoms.  Review of Systems  Positive: History of Mnire  Negative:   Physical Exam  LMP  (LMP Unknown) vital signs during triage: Patient has systolic hypertension. Gen:   Awake, no distress   Resp:  Normal effort  MSK:   Moves extremities without difficulty.  Left shoulder patient has a sling.  Other:    Medical Decision Making  Medically screening exam initiated at 4:10 PM.  Appropriate orders placed.  Elaine Melendez was informed that the remainder of the evaluation will be completed by another provider, this initial triage assessment does not replace that evaluation, and the importance of remaining in the ED until their evaluation is complete. Patient who presents today with history of vomiting, diarrhea without blood in the stools, without fever, shaded to weakness and dizziness.  Patient had left shoulder history 3 weeks ago.  Ordered CBC CMP UA, lipase    Elaine Kast, PA-C 03/26/24 1613    Elaine Kast, PA-C 03/26/24 [phone]

## 2024-03-26 NOTE — ED Triage Notes (Addendum)
 Pt presents to the ED via POV from home with husband for nausea. Pt reports one episode of emesis 2 days ago. Pt reports having a shoulder surgery three weeks ago. Pt feels as if she is dehydrated. Hx of meniere's disease. Provider assessed in triage. Pt A&Ox4.

## 2024-03-26 NOTE — Telephone Encounter (Signed)
 FYI Only or Action Required?: FYI only for provider.  Patient was last seen in primary care on 08/24/2023 by Myrla Jon HERO, MD.  Called Nurse Triage reporting Fatigue.  Symptoms began several days ago.  Interventions attempted: Nothing.  Symptoms are: gradually worsening.  Triage Disposition: Go to ED Now (or PCP Triage)  Patient/caregiver understands and will follow disposition?: Yes  Pt states that she has not been eating or drinking much and feels very weak. States she can't stand for long. States the inside of her mouth feels dry. States that when she trys to stands she gets a tingling up her back and neck and into her head. Instructed ED  Copied from CRM [id number]. Topic: Clinical - Red Word Triage >> Mar 26, 2024  2:38 PM Sophia H wrote: Red Word that prompted transfer to Nurse Triage: Patient states has been having weakness, nausea and a hard time getting up lately. States she feels like she is dehydrated, has a tingling from the bottom of her back to her head when trying to get up. Recently had a shoulder surgery back on July 17, unsure if it is related. Reason for Disposition  [1] Drinking very little AND [2] dehydration suspected (e.g., no urine > 12 hours, very dry mouth, very lightheaded)  Answer Assessment - Initial Assessment Questions 1. DESCRIPTION: Describe how you are feeling.     Weak  2. SEVERITY: How bad is it?  Can you stand and walk?     Can stand and walk but gets weak 3. ONSET: When did these symptoms begin? (e.g., hours, days, weeks, months)     4-5 day  4. CAUSE: What do you think is causing the weakness or fatigue? (e.g., not drinking enough fluids, medical problem, trouble sleeping)     Dehydration- to drinking enough fluids 5. NEW MEDICINES:  Have you started on any new medicines recently? (e.g., opioid pain medicines, benzodiazepines, muscle relaxants, antidepressants, antihistamines, neuroleptics, beta blockers)     Has stopped  medications from the surgery  6. OTHER SYMPTOMS: Do you have any other symptoms? (e.g., chest pain, fever, cough, SOB, vomiting, diarrhea, bleeding, other areas of pain)     Nausea, tingling up neck and back and head, no appetite  Protocols used: Weakness (Generalized) and Fatigue-A-AH

## 2024-03-26 NOTE — ED Provider Notes (Signed)
 [facility] Provider Note    None    (approximate)   History   Nausea   HPI  Elaine Melendez is a 68 y.o. female   Past medical history of Mnire's disease, hypercholesterolemia, recent left shoulder surgery approximately 3 weeks ago uncomplicated who presents to Emergency Department with nausea vomiting diarrhea for the last 3 days.  No abdominal pain.  No GI bleeding.  Has had very poor p.o. intake.  Deals with low potassium chronically.    No known sick contacts or recent travel.  Denies urinary symptoms.  Of note she stopped taking her oxycodone  medication postoperatively approximately 4 days ago.  Independent Historian contributed to assessment above: Husband corroborates information given above  External Medical Documents Reviewed: Colonoscopy from May 2025 which was largely unremarkable      Physical Exam   Triage Vital Signs: ED Triage Vitals  Encounter Vitals Group     BP 03/26/24 1609 (!) 141/79     Girls Systolic BP Percentile --      Girls Diastolic BP Percentile --      Boys Systolic BP Percentile --      Boys Diastolic BP Percentile --      Pulse Rate 03/26/24 1609 93     Resp 03/26/24 1609 18     Temp 03/26/24 1609 98.8 F (37.1 C)     Temp Source 03/26/24 1609 Oral     SpO2 03/26/24 1609 96 %     Weight 03/26/24 1610 211 lb (95.7 kg)     Height 03/26/24 1610 5' 4 (1.626 m)     Head Circumference --      Peak Flow --      Pain Score 03/26/24 1610 0     Pain Loc --      Pain Education --      Exclude from Growth Chart --     Most recent vital signs: Vitals:   03/26/24 1609 03/26/24 2249  BP: (!) 141/79 (!) 147/88  Pulse: 93 72  Resp: 18 12  Temp: 98.8 F (37.1 C) 98.2 F (36.8 C)  SpO2: 96% 100%    General: Awake, no distress.  CV:  Good peripheral perfusion.  Resp:  Normal effort.  Abd:  No distention.  Other:  Awake alert comfortable appearing with normal vital signs slightly hypertensive afebrile.   Soft benign abdominal exam to deep palpation all quadrants without rigidity or guarding.  Mucous membranes appear slightly dry she appears slightly dehydrated.   ED Results / Procedures / Treatments   Labs (all labs ordered are listed, but only abnormal results are displayed) Labs Reviewed  COMPREHENSIVE METABOLIC PANEL WITH GFR - Abnormal; Notable for the following components:      Result Value   Potassium 2.9 (*)    Glucose, Bld 127 (*)    All other components within normal limits  CBC - Abnormal; Notable for the following components:   Hemoglobin 15.1 (*)    All other components within normal limits  LIPASE, BLOOD  MAGNESIUM  URINALYSIS, ROUTINE W REFLEX MICROSCOPIC     I ordered and reviewed the above labs they are notable for potassium slightly low at 2.9 otherwise cell counts electrolytes unremarkable.  PROCEDURES:  Critical Care performed: No  Procedures   MEDICATIONS ORDERED IN ED: Medications  potassium chloride  10 mEq in 100 mL IVPB (10 mEq Intravenous New Bag/Given 03/27/24 0042)  sodium chloride  0.9 % bolus 1,000 mL (1,000 mLs Intravenous New Bag/Given 03/26/24  2306)  ondansetron  (ZOFRAN ) injection 4 mg (4 mg Intravenous Given 03/26/24 2306)  potassium chloride  SA (KLOR-CON  M) CR tablet 40 mEq (40 mEq Oral Given 03/27/24 0002)  diazepam  (VALIUM ) tablet 5 mg (5 mg Oral Given 03/27/24 0002)    IMPRESSION / MDM / ASSESSMENT AND PLAN / ED COURSE  I reviewed the triage vital signs and the nursing notes.                                Patient's presentation is most consistent with acute presentation with potential threat to life or bodily function.  Differential diagnosis includes, but is not limited to, viral gastroenteritis, withdrawal symptoms from narcotics, considered intra-abdominal infection or obstruction, urinary tract infection, mesenteric ischemia   The patient is on the cardiac monitor to evaluate for evidence of arrhythmia and/or significant heart  rate changes.  MDM:    Viral gastroenteritis likely or a mixed picture with some narcotic withdrawal.  Fortunately looks stable with a very benign abdominal exam ruling against surgical abdominal pathology is an otherwise unremarkable lab analysis except for some low potassium which we repleted.  Gave IV fluids for slight dehydration on clinical exam, and she is able to tolerate p.o. intake after some Zofran .  I discussed the utility and offered CT scan to look for other more emergent findings within the abdomen and pelvis though I think low clinical suspicion and patient and husband elect to hold off at this time which I think is very reasonable.  She understands return to the emergency department with any new or worsening symptoms.    I considered hospitalization for admission or observation however given her ability to tolerate p.o., feeling better after fluids, and otherwise unremarkable exam as above, she understands to return with new or worsening symptoms but I do think that she can be discharged at this time with PMD follow-up in the morning.        FINAL CLINICAL IMPRESSION(S) / ED DIAGNOSES   Final diagnoses:  Nausea vomiting and diarrhea  Hypokalemia     Rx / DC Orders   ED Discharge Orders     None        Note:  This document was prepared using Dragon voice recognition software and may include unintentional dictation errors.    Cyrena Mylar, MD 03/27/24 [phone]

## 2024-03-27 ENCOUNTER — Ambulatory Visit: Payer: Self-pay

## 2024-03-27 DIAGNOSIS — R112 Nausea with vomiting, unspecified: Secondary | ICD-10-CM | POA: Diagnosis not present

## 2024-03-27 MED ORDER — ONDANSETRON 4 MG PO TBDP: 4.0000 mg | ORAL_TABLET | Freq: Three times a day (TID) | ORAL | 0 refills | Status: AC | PRN

## 2024-03-27 NOTE — Telephone Encounter (Signed)
 FYI Only or Action Required?: Action required by provider: request for appointment.  Patient was last seen in primary care on 08/24/2023 by Myrla Jon HERO, MD.  Called Nurse Triage reporting Fatigue.  Symptoms began several days ago.  Interventions attempted: Prescription medications: Zofran .  Symptoms are: unchanged. Seen in ED 03/26/24. No vomiting, diarrhea x 1 today. I'm still very weak. Reviewed home remedies. Follow up appointment made.  Triage Disposition: See PCP When Office is Open (Within 3 Days)  Patient/caregiver understands and will follow disposition?: Yes   Copied from CRM #[id number]. Topic: Clinical - Red Word Triage >> Mar 27, 2024  2:16 PM Fonda T wrote: [facility] that prompted transfer to Nurse Triage: Received call from patient, states she was seen in hospital, [facility],  on yesterday 03/26/24, reports she is still not feeling well.   States she has increase headaches, dizziness when standing, vomiting, ongoing diarrhea, unstable when walking, extremely fatigued.   Patient states hospital gave fluids, and blood work showed low potassium, and was released. Reason for Disposition  [1] Fatigue (i.e., tires easily, decreased energy) AND [2] persists > 1 week  Answer Assessment - Initial Assessment Questions 1. DESCRIPTION: Describe how you are feeling.     Feels weak 2. SEVERITY: How bad is it?  Can you stand and walk?     yes 3. ONSET: When did these symptoms begin? (e.g., hours, days, weeks, months)     Last week, Thursday 4. CAUSE: What do you think is causing the weakness or fatigue? (e.g., not drinking enough fluids, medical problem, trouble sleeping)     Maybe viral 5. NEW MEDICINES:  Have you started on any new medicines recently? (e.g., opioid pain medicines, benzodiazepines, muscle relaxants, antidepressants, antihistamines, neuroleptics, beta blockers)     no 6. OTHER SYMPTOMS: Do you have any other symptoms? (e.g., chest pain,  fever, cough, SOB, vomiting, diarrhea, bleeding, other areas of pain)     Diarrhea x 1 today 7. PREGNANCY: Is there any chance you are pregnant? When was your last menstrual period?     no  Protocols used: Weakness (Generalized) and Fatigue-A-AH

## 2024-03-27 NOTE — Discharge Instructions (Signed)
 Take Zofran as needed for nausea and vomiting.  Drink plenty of fluids to stay well-hydrated.  Find Pedialyte or similar electrolyte rehydration formulas at your local pharmacy.  Thank you for choosing [place] for your health care today!  Please see your primary doctor this week for a follow up appointment.   If you have any new, worsening, or unexpected symptoms call your doctor right away or come back to the emergency department for reevaluation.  It was my pleasure to care for you today.   Daneil Dan Modesto Charon, MD

## 2024-03-27 NOTE — ED Notes (Signed)
 Questions and concerns addressed. Discharge teaching completed.   Prescriptions reviewed and pharmacy verified.   Pt discharged with husband.

## 2024-03-28 ENCOUNTER — Ambulatory Visit: Payer: Self-pay

## 2024-03-28 ENCOUNTER — Ambulatory Visit: Admission: EM | Admit: 2024-03-28 | Discharge: 2024-03-28 | Disposition: A | Discharge status: Home / Self Care

## 2024-03-28 DIAGNOSIS — R197 Diarrhea, unspecified: Secondary | ICD-10-CM

## 2024-03-28 DIAGNOSIS — R531 Weakness: Secondary | ICD-10-CM | POA: Diagnosis not present

## 2024-03-28 DIAGNOSIS — R42 Dizziness and giddiness: Secondary | ICD-10-CM

## 2024-03-28 DIAGNOSIS — R110 Nausea: Secondary | ICD-10-CM | POA: Diagnosis not present

## 2024-03-28 DIAGNOSIS — E860 Dehydration: Secondary | ICD-10-CM

## 2024-03-28 DIAGNOSIS — R638 Other symptoms and signs concerning food and fluid intake: Secondary | ICD-10-CM | POA: Diagnosis not present

## 2024-03-28 NOTE — ED Provider Notes (Signed)
 [facility] Emergency Department Provider Note    ED Clinical Impression     Diagnosis ICD-10-CM Associated Orders  1. Nausea and vomiting, unspecified vomiting type  R11.2     2. Diarrhea, unspecified type  R19.7           Impression, Medical Decision Making, Progress Notes and Critical Care    Impression, Differential Diagnosis and Plan of Care  Patient is a 68 y.o. female with past medical history of Meniere's disease presenting with 1 week of nausea, diarrhea, weakness, dizziness, and frontal headache with 3 episodes of emesis 4 days ago.   Patient is well-appearing and in no acute distress. Vital signs are hemodynamically stable and within normal limits. On exam, no abdominal tenderness. Lungs clear to auscultation bilaterally. Heart with regular rate and rhythm.   Differential includes viral gastroenteritis, electrolyte abnormalities, anemia, pancreatitis, cholecystitis, cannabinoid hyperemesis syndrome, UTI, chronic nausea secondary to Mnire's disease, and dehydration.  See ED course for initial workup.  Patient's nausea was initially controlled with Zofran , however had returned on reevaluation.  Given droperidol which improved nausea, she was successfully p.o. challenged without difficulty and wanting to go home at this time.  Patient was prescribed 5 mg Compazine for nausea relief, already has Zofran  at home, told to follow-up with PCP, given strict ED return precautions and discharged.  ED Course as of 03/29/24 0308  Thu Mar 28, 2024  1736 On reevaluation, patient's nausea has improved, given she states she noticed some mild hematuria will obtain urinalysis and then likely discharge.  1737 CBC showing no leukocytosis, hemoglobin near baseline.  CMP showing potassium decreased at 3.1, will replenish IV, has known history of chronic hypokalemia.  1737 RPR did not show any detection of virus, EKG not showing any acute ischemic changes or evidence of  arrhythmia.  1827 Leukocyte Esterase, UA(!): Moderate No bacteria seen, patient denying dysuria, will opt not to treat at this time and follow-up with culture.    Additional MDM Elements       I have reviewed recent and relavant previous record, including: None available  Social Determinants that significantly affected care: N/A     Portions of this record have been created using Scientist, clinical (histocompatibility and immunogenetics). Dictation errors have been sought, but may not have been identified and corrected.  See chart and nursing documentation for additional ED course details.  ____________________________________________      History     Reason for Visit Emesis and Weakness   HPI  Elaine Melendez is a 68 y.o. female with past medical history of Meniere's disease who presents with nausea and emesis. Patient reports constant nausea and diarrhea starting 1 week ago with associated weakness, dizziness, and a frontal headache. She endorses 3 episodes of emesis 4 days ago, but no emesis since then. She has been taking Zofran  at home with mild relief of her symptoms, but still endorses decreased appetite and PO intake 2/2 nausea. Of note, patient states she has a history of Meniere's disease, but states her weakness and dizziness feel different from her Meniere's disease. She states she was seen at an OSH 3 days ago for her symptoms, at which time her potassium was 2.4, and she was treated with potassium and IV fluids. Importantly, patient states that she has a history of hypokalemia, and has been taking potassium supplements for several years. She was then seen at Urgent Care earlier today for weakness and dizziness.  Patient had associated hematuria  of note, patient had a  surgery 1 month ago (02/29/24). Denies hemoptysis, hematochezia, chest pain, shortness of breath, dysuria, fevers, cough, congestion, or abdominal pain.  Outside Historian(s) (EMS, Significant Other, Family, Parent, Caregiver, Friend, Law  Enforcement, etc.)  None.  Past Medical History[1]  Past Surgical History[2]  No current facility-administered medications for this encounter.  Current Outpatient Medications:  .  prochlorperazine (COMPAZINE) 10 MG tablet, Take 0.5 tablets (5 mg total) by mouth two (2) times a day as needed for nausea for up to 5 days., Disp: 5 tablet, Rfl: 0  Allergies Patient has no known allergies.  Family History[3]  Short Social History[4]    Physical Exam   ED Triage Vitals [03/28/24 1213]  Enc Vitals Group     BP 172/88     Pulse 79     SpO2 Pulse      Resp 20     Temp 36.9 C (98.4 F)     Temp Source Temporal     SpO2 99 %   Constitutional: Alert and oriented. Well appearing and in no distress. Eyes: Conjunctivae are normal.  ENT      Head: Normocephalic and atraumatic.      Nose: No congestion.      Mouth/Throat: Mucous membranes are moist.      Neck: No stridor. Cardiovascular: Normal rate, regular rhythm. Normal and symmetric distal pulses are present in all extremities. Respiratory: Normal respiratory effort. Breath sounds are normal. Gastrointestinal: Soft and nontender. There is no CVA tenderness. Genitourinary: Deferred. Musculoskeletal: Normal range of motion in all extremities.      Right lower leg: No tenderness or edema.      Left lower leg: No tenderness or edema. Neurologic: Normal speech and language.  Skin: Skin is warm, dry and intact. No rash noted. Psychiatric: Mood and affect are normal. Speech and behavior are normal.    Radiology   No orders to display     Procedures including Critical Care   Documentation assistance was provided by Max Caza, Scribe on March 28, 2024 at 3:18 PM for [employer], DO.  Documentation assistance provided by the above mentioned scribe. I was present during the time the encounter was recorded. The information recorded by the scribe was done at my direction and has been reviewed and validated by me.          [1] No past medical history on file. [2] No past surgical history on file. [3] No family history on file. [4]    Marti Mitts, DO Resident 03/29/24 [phone]

## 2024-03-28 NOTE — Telephone Encounter (Signed)
 FYI Only or Action Required?: Action required by provider: request for appointment.  Patient was last seen in primary care on 08/24/2023 by Myrla Jon HERO, MD.  Called Nurse Triage reporting Fatigue.  Symptoms began a week ago.  Interventions attempted: Rest, hydration, or home remedies.  Symptoms are: unchanged.  Triage Disposition: See Physician Within 24 Hours  Patient/caregiver understands and will follow disposition?: No, wishes to speak with PCP       Copied from CRM #[id number]. Topic: Clinical - Red Word Triage >> Mar 28, 2024  8:49 AM Gustabo D wrote: Elaine Melendez to ER was given fluid and today she isn't feeling better- feeling weak, loss of appetite, diarrhea Reason for Disposition  [1] MODERATE weakness (e.g., interferes with work, school, normal activities) AND [2] persists > 3 days  Answer Assessment - Initial Assessment Questions 1. DESCRIPTION: Describe how you are feeling.     States feeling very week 2. SEVERITY: How bad is it?  Can you stand and walk?     Can't get up without feeling nausea and sick  3. ONSET: When did these symptoms begin? (e.g., hours, days, weeks, months)     A while 4. CAUSE: What do you think is causing the weakness or fatigue? (e.g., not drinking enough fluids, medical problem, trouble sleeping)     unknown 5. NEW MEDICINES:  Have you started on any new medicines recently? (e.g., opioid pain medicines, benzodiazepines, muscle relaxants, antidepressants, antihistamines, neuroleptics, beta blockers)     Stopped taking medications from surgery  6. OTHER SYMPTOMS: Do you have any other symptoms? (e.g., chest pain, fever, cough, SOB, vomiting, diarrhea, bleeding, other areas of pain)     Nausea, fatigue and no appetite  Protocols used: Weakness (Generalized) and Fatigue-A-AH

## 2024-03-28 NOTE — Telephone Encounter (Signed)
 Recommend pt be seen by higher level care, may need IV fluids, if that weak from nausea, vomiting, and no appetite

## 2024-03-28 NOTE — ED Triage Notes (Signed)
 C/o N/V starting last Thursday. Had surgery on July 17th. Seen on Monday by another facility for same. K was 2.4.  Given K and IVF with some relief but symptoms persisted. Seen at UC this morning due to feeling weak and dizzy. Sent here for repeat labs and IVF. Took ODT zofran  without relief.

## 2024-03-28 NOTE — ED Provider Notes (Signed)
 Elaine Melendez    CSN: [id number] Arrival date & time: 03/28/24  1021      History   Chief Complaint Chief Complaint  Patient presents with   Diarrhea    HPI Elaine Melendez is a 68 y.o. female.  Accompanied by her husband, patient presents with generalized weakness, nausea, diarrhea, dizziness, decreased oral intake x 1 week.  She reports emesis last week but none in the last few days.  1 episode of diarrhea today.  She took Zofran  this morning and has had sips of Pedialyte.  She was seen at [facility] ED on 03/26/2024; diagnosed with nausea, vomiting, diarrhea, hypokalemia; her potassium level was 2.9; she was treated with IV fluids, Zofran , potassium.  Patient denies focal weakness, chest pain, shortness of breath.  The history is provided by the spouse, the patient and medical records.    Past Medical History:  Diagnosis Date   Anxiety    Arthritis Shoulder   Cannot sleep 05/10/2017   Cataract    Dense breast tissue 05/25/2017   Dysrhythmia    remote hs tachycardia>15 yrs ago, no meds   GERD (gastroesophageal reflux disease)    Headache    Hypercholesteremia 01/31/2007   Joint effusion 12/05/2017   Left breast mass 06/21/2017   Meniere's disease    Oxygen deficiency    Polyarthralgia 12/05/2017   Sleep apnea May   Cpap    Patient Active Problem List   Diagnosis Date Noted   Screening for colon cancer 12/27/2023   Avitaminosis D 08/24/2023   Obesity, morbid (HCC) 08/24/2023   Pain in left shoulder 07/19/2023   Presence of right artificial shoulder joint 07/19/2023   Lymphedema 08/22/2022   Other chest pain 08/22/2022   Palpitations 10/25/2021   Localized edema 11/21/2019   Localized, primary osteoarthritis of shoulder region 09/04/2019   Family history of malignant neoplasm of gastrointestinal tract    Polyp of transverse colon    Lipoma of colon    Rectal polyp    Thyroid  nodule 02/19/2019   Lofgren's syndrome 04/24/2018   Fatigue 04/24/2018    Gravida 0 02/06/2018   Subareolar mass of left breast 06/21/2017   Dense breast tissue 05/25/2017   Cannot sleep 05/10/2017   Anxiety and depression 02/28/2007   Hypercholesteremia 01/31/2007   Auditory vertigo 01/31/2007   Common migraine with intractable migraine 01/31/2007   History of colon polyps 01/31/2007    Past Surgical History:  Procedure Laterality Date   ANKLE FRACTURE SURGERY Right    BREAST BIOPSY Left 09/14/2021   left breast stereo, coil marker, PASH   BREAST EXCISIONAL BIOPSY Left 09/11/2018   neg high risk lesion   CHOLECYSTECTOMY     COLONOSCOPY N/A 12/27/2023   Procedure: COLONOSCOPY;  Surgeon: Unk Corinn Skiff, MD;  Location: [facility] ENDOSCOPY;  Service: Gastroenterology;  Laterality: N/A;   COLONOSCOPY WITH PROPOFOL  N/A 03/27/2019   Procedure: COLONOSCOPY WITH PROPOFOL ;  Surgeon: Janalyn Keene NOVAK, MD;  Location: [facility] ENDOSCOPY;  Service: Gastroenterology;  Laterality: N/A;   ENDOBRONCHIAL ULTRASOUND N/A 02/27/2018   Procedure: ENDOBRONCHIAL ULTRASOUND;  Surgeon: Verdia Art, MD;  Location: [facility] ORS;  Service: Pulmonary;  Laterality: N/A;   FRACTURE SURGERY     JOINT REPLACEMENT  Shoulder   RADIOACTIVE SEED GUIDED EXCISIONAL BREAST BIOPSY Left 09/12/2017   Procedure: 2 RADIOACTIVE SEED GUIDED EXCISIONAL LEFT BREAST BIOPSY ERAS PATHWAY;  Surgeon: Aron Shoulders, MD;  Location: [facility];  Service: General;  Laterality: Left;   TOTAL SHOULDER REPLACEMENT  TOTAL SHOULDER REPLACEMENT Left 12/30/2023   TUBAL LIGATION      OB History   No obstetric history on file.      Home Medications    Prior to Admission medications   Medication Sig Start Date End Date Taking? Authorizing Provider  albuterol  (VENTOLIN  HFA) 108 (90 Base) MCG/ACT inhaler TAKE 2 PUFFS BY MOUTH EVERY 6 HOURS AS NEEDED FOR WHEEZE OR SHORTNESS OF BREATH 05/01/23   Bacigalupo, Jon HERO, MD  celecoxib  (CELEBREX ) 200 MG capsule Take 200 mg by mouth 2 (two) times  daily as needed. 06/28/21   [provider]  Cyanocobalamin (VITAMIN B12 PO) Take by mouth.    [provider]  dimenhyDRINATE (DRAMAMINE) 50 MG tablet Take 50 mg by mouth every 8 (eight) hours as needed.    [provider]  fluticasone  (FLONASE ) 50 MCG/ACT nasal spray SPRAY 2 SPRAYS INTO EACH NOSTRIL EVERY DAY 11/30/23   Bacigalupo, Angela M, MD  nystatin  (MYCOSTATIN /NYSTOP ) powder Apply topically 3 (three) times daily. 02/19/19   Bacigalupo, Angela M, MD  ondansetron  (ZOFRAN -ODT) 4 MG disintegrating tablet Take 1 tablet (4 mg total) by mouth every 8 (eight) hours as needed for nausea or vomiting. 03/27/24   Cyrena Mylar, MD  pantoprazole  (PROTONIX ) 40 MG tablet TAKE 1 TABLET BY MOUTH EVERYDAY AT BEDTIME 01/24/24   Jinny Carmine, MD  potassium chloride  SA (KLOR-CON  M20) 20 MEQ tablet TAKE 1 TABLET BY MOUTH EVERY DAY 11/01/23   Bacigalupo, Jon HERO, MD  rosuvastatin  (CRESTOR ) 5 MG tablet Take 1 tablet (5 mg total) by mouth daily. 08/25/23   Bacigalupo, Angela M, MD  SUMAtriptan (IMITREX) 50 MG tablet Take 1 tablet (50 mg total) by mouth every 2 (two) hours as needed for migraine. 08/17/21   Bacigalupo, Angela M, MD  venlafaxine  (EFFEXOR ) 75 MG tablet TAKE 1 TABLET BY MOUTH EVERY DAY 03/13/24   Bacigalupo, Jon HERO, MD  VITAMIN D  PO Take by mouth daily.    [provider]    Family History Family History  Problem Relation Age of Onset   Alzheimer's disease Mother    Colon polyps Mother        cancerous; partial colon removal   Arthritis Mother    Cancer Mother    Stroke Mother    Squamous cell carcinoma Father    Hypertension Father    Heart attack Father    Heart disease Father    Breast cancer Paternal Aunt    Breast cancer Maternal Grandmother     Social History Social History   Tobacco Use   Smoking status: Never   Smokeless tobacco: Never  Vaping Use   Vaping status: Never Used  Substance Use Topics   Alcohol use: No   Drug use: Never      Allergies   Nickel   Review of Systems Review of Systems  Constitutional:  Negative for chills and fever.  Respiratory:  Negative for cough and shortness of breath.   Cardiovascular:  Negative for chest pain and palpitations.  Gastrointestinal:  Positive for diarrhea, nausea and vomiting. Negative for abdominal pain and blood in stool.  Neurological:  Positive for dizziness, weakness and light-headedness. Negative for syncope, facial asymmetry, speech difficulty and numbness.     Physical Exam Triage Vital Signs ED Triage Vitals  Encounter Vitals Group     BP 03/28/24 1049 134/78     Girls Systolic BP Percentile --      Girls Diastolic BP Percentile --      Boys  Systolic BP Percentile --      Boys Diastolic BP Percentile --      Pulse Rate 03/28/24 1049 78     Resp 03/28/24 1049 18     Temp 03/28/24 1049 98.4 F (36.9 C)     Temp src --      SpO2 03/28/24 1049 96 %     Weight --      Height --      Head Circumference --      Peak Flow --      Pain Score 03/28/24 1056 0     Pain Loc --      Pain Education --      Exclude from Growth Chart --    No data found.  Updated Vital Signs BP 134/78   Pulse 78   Temp 98.4 F (36.9 C)   Resp 18   LMP  (LMP Unknown)   SpO2 96%   Visual Acuity Right Eye Distance:   Left Eye Distance:   Bilateral Distance:    Right Eye Near:   Left Eye Near:    Bilateral Near:     Physical Exam Constitutional:      General: She is not in acute distress.    Appearance: She is ill-appearing.  HENT:     Right Ear: Tympanic membrane normal.     Left Ear: Tympanic membrane normal.     Nose: Nose normal.     Mouth/Throat:     Mouth: Mucous membranes are dry.     Pharynx: Oropharynx is clear.  Cardiovascular:     Rate and Rhythm: Normal rate and regular rhythm.     Heart sounds: Normal heart sounds.  Pulmonary:     Effort: Pulmonary effort is normal. No respiratory distress.     Breath sounds: Normal breath sounds.   Abdominal:     General: Bowel sounds are normal.     Palpations: Abdomen is soft.     Tenderness: There is no abdominal tenderness. There is no guarding or rebound.  Skin:    General: Skin is warm and dry.     Coloration: Skin is pale.  Neurological:     General: No focal deficit present.     Mental Status: She is alert and oriented to person, place, and time.      UC Treatments / Results  Labs (all labs ordered are listed, but only abnormal results are displayed) Labs Reviewed - No data to display  EKG   Radiology No results found.  Procedures Procedures (including critical care time)  Medications Ordered in UC Medications - No data to display  Initial Impression / Assessment and Plan / UC Course  I have reviewed the triage vital signs and the nursing notes.  Pertinent labs & imaging results that were available during my care of the patient were reviewed by me and considered in my medical decision making (see chart for details).    Generalized weakness, nausea, diarrhea, decreased oral intake, dehydration, dizziness.  Patient is ill-appearing but in no acute distress.  Afebrile and vital signs are stable.  She was seen at [facility] ED and her potassium was 2.9 at that time; she was given IV fluids, Zofran , potassium.  Sending her to the ED for evaluation based on her symptoms and exam.  She is accompanied by her husband.  They decline EMS.  Her husband plans to take her to [facility].  Final Clinical Impressions(s) / UC Diagnoses   Final diagnoses:  Generalized weakness  Nausea without vomiting  Diarrhea, unspecified type  Decreased oral intake  Dehydration  Dizziness     Discharge Instructions      Go to the emergency department for evaluation of your symptoms.     ED Prescriptions   None    PDMP not reviewed this encounter.   Elaine Burnard DEL, NP 03/28/24 1139

## 2024-03-28 NOTE — Telephone Encounter (Signed)
 Called pt no answer, lvm to seek care at ER. Per pt chart pt went to Urgent care to be seen.

## 2024-03-28 NOTE — ED Triage Notes (Addendum)
 Patient to Urgent Care with complaints of weakness/ nausea/ diarrhea. Hx of meniere's. Feels as though she is having a meniere' attack- dizzy and weak when sitting up.   Symptoms x1 week. Seen at the ER 8/12.   Took dose of zofran  this AM but still able to tolerate very little PO and poor appetite. Trying to push water  and Pedialyte.

## 2024-03-28 NOTE — Discharge Instructions (Signed)
Go to the emergency department for evaluation of your symptoms.    

## 2024-03-28 NOTE — Telephone Encounter (Signed)
 Noted! Thank you

## 2024-03-28 NOTE — Telephone Encounter (Signed)
 Pt called back in stating she is feeling no better even after the ED visit, states she needs to see someone today. States she spoke with someone yesterday that stated they could work her in today or tomorrow. Instructed pt I would send message and that if symptoms worsen to go back to the ED. Please advise.

## 2024-03-31 NOTE — ED Notes (Signed)
 [facility] Emergency Department Attestation Note     ED Clinical Impression    Final diagnoses:  Nausea and vomiting, unspecified vomiting type (Primary)  Diarrhea, unspecified type      ED Attending Physician Teaching Attestation    I supervised care provided by the resident. We have discussed the case. I have reviewed the note and agree with the plan of treatment except as documented in my note.  I have personally performed a face-to-face diagnostic evaluation on this patient.   Considered atypical cardiac presentation, but EKG without ischemic changes, and presence of diarrhea is not consistent with this. Abdominal exam benign. Minimal concern for acute abdomen at this time. Discussed admission to the hospital for further workup and management versus symptomatic management at home. She opted for at-home management and outpatient follow-up. Compazine prescribed. Return precautions provided.  UA with some abnormalities, but will let this go to culture. Not clear it represents infection. Patient feeling better but tired at discharge.  Portions of this record may have been created using Scientist, clinical (histocompatibility and immunogenetics). Dictation errors have been sought, but may not have been identified and corrected.  See chart and resident/APP provider documentation for details.

## 2024-04-01 ENCOUNTER — Inpatient Hospital Stay: Admitting: Family Medicine

## 2024-04-21 ENCOUNTER — Other Ambulatory Visit: Payer: Self-pay | Admitting: Gastroenterology

## 2024-05-07 ENCOUNTER — Other Ambulatory Visit: Payer: Self-pay | Admitting: Family Medicine

## 2024-05-07 NOTE — Telephone Encounter (Signed)
 LOV- 08/24/2023 NOV- 05/16/2024 LRF- 11/01/2023 90 x 1

## 2024-05-09 ENCOUNTER — Telehealth: Payer: Self-pay

## 2024-05-09 DIAGNOSIS — E876 Hypokalemia: Secondary | ICD-10-CM

## 2024-05-09 NOTE — Telephone Encounter (Signed)
 Copied from CRM #[id number]. Topic: Clinical - Lab/Test Results >> May 08, 2024  4:08 PM Avram MATSU wrote: Reason for CRM: complete lab work, had a recent ed visit

## 2024-05-10 NOTE — Telephone Encounter (Signed)
 Looks like she was scheduled on 10/2 for lab follow up (I think that is the same as ED f/u).  We can give her something sooner if she'd prefer

## 2024-05-13 NOTE — Telephone Encounter (Signed)
 Ok to order for before visit - needs CMP and CBC (dx hypokalemia)

## 2024-05-13 NOTE — Telephone Encounter (Signed)
 Orders placed. Pt advised. Verbalized understanding

## 2024-05-15 LAB — COMPREHENSIVE METABOLIC PANEL WITH GFR
ALT: 8 IU/L (ref 0–32)
AST: 9 IU/L (ref 0–40)
Albumin: 4 g/dL (ref 3.9–4.9)
Alkaline Phosphatase: 113 IU/L (ref 49–135)
BUN/Creatinine Ratio: 17 (ref 12–28)
BUN: 13 mg/dL (ref 8–27)
Bilirubin Total: 0.4 mg/dL (ref 0.0–1.2)
CO2: 24 mmol/L (ref 20–29)
Calcium: 9.5 mg/dL (ref 8.7–10.3)
Chloride: 105 mmol/L (ref 96–106)
Creatinine, Ser: 0.76 mg/dL (ref 0.57–1.00)
Globulin, Total: 2.1 g/dL (ref 1.5–4.5)
Glucose: 96 mg/dL (ref 70–99)
Potassium: 4.3 mmol/L (ref 3.5–5.2)
Sodium: 143 mmol/L (ref 134–144)
Total Protein: 6.1 g/dL (ref 6.0–8.5)
eGFR: 84 mL/min/1.73 (ref >59)

## 2024-05-15 LAB — CBC WITH DIFFERENTIAL/PLATELET
Basophils Absolute: 0.1 x10E3/uL (ref 0.0–0.2)
Basos: 1 %
EOS (ABSOLUTE): 0.8 x10E3/uL — ABNORMAL HIGH (ref 0.0–0.4)
Eos: 8 %
Hematocrit: 41.1 % (ref 34.0–46.6)
Hemoglobin: 13.5 g/dL (ref 11.1–15.9)
Immature Grans (Abs): 0 x10E3/uL (ref 0.0–0.1)
Immature Granulocytes: 0 %
Lymphocytes Absolute: 3.8 x10E3/uL — ABNORMAL HIGH (ref 0.7–3.1)
Lymphs: 43 %
MCH: 30.1 pg (ref 26.6–33.0)
MCHC: 32.8 g/dL (ref 31.5–35.7)
MCV: 92 fL (ref 79–97)
Monocytes Absolute: 0.7 x10E3/uL (ref 0.1–0.9)
Monocytes: 7 %
Neutrophils Absolute: 3.8 x10E3/uL (ref 1.4–7.0)
Neutrophils: 41 %
Platelets: 231 x10E3/uL (ref 150–450)
RBC: 4.48 x10E6/uL (ref 3.77–5.28)
RDW: 12.1 % (ref 11.7–15.4)
WBC: 9.2 x10E3/uL (ref 3.4–10.8)

## 2024-05-16 ENCOUNTER — Ambulatory Visit: Admitting: Family Medicine

## 2024-05-16 ENCOUNTER — Ambulatory Visit: Payer: Self-pay | Admitting: Family Medicine

## 2024-05-16 VITALS — BP 130/78 | HR 75 | Ht 64.0 in | Wt 217.8 lb

## 2024-05-16 DIAGNOSIS — F32A Depression, unspecified: Secondary | ICD-10-CM

## 2024-05-16 DIAGNOSIS — F419 Anxiety disorder, unspecified: Secondary | ICD-10-CM | POA: Diagnosis not present

## 2024-05-16 DIAGNOSIS — L249 Irritant contact dermatitis, unspecified cause: Secondary | ICD-10-CM

## 2024-05-16 DIAGNOSIS — E876 Hypokalemia: Secondary | ICD-10-CM | POA: Diagnosis not present

## 2024-05-16 MED ORDER — NYSTATIN 100000 UNIT/GM EX POWD: Freq: Three times a day (TID) | CUTANEOUS | 3 refills | Status: AC

## 2024-05-16 MED ORDER — TRIAMCINOLONE ACETONIDE 0.5 % EX OINT: 1.0000 | TOPICAL_OINTMENT | Freq: Two times a day (BID) | CUTANEOUS | 0 refills | Status: AC

## 2024-05-16 MED ORDER — VENLAFAXINE HCL 37.5 MG PO TABS: 37.5000 mg | ORAL_TABLET | Freq: Every day | ORAL | 1 refills | Status: AC

## 2024-05-16 NOTE — Progress Notes (Signed)
 Established patient visit   Patient: Elaine Melendez   DOB: May 21, 1956   68 y.o. Female  MRN: [id number] Visit Date: 05/16/2024  Today's healthcare provider: Jon Eva, MD   Chief Complaint  Patient presents with   Follow-up    Patient seen in ED 03/28/24 for weakness, nausea, vomiting and diarrhea.    Subjective    HPI HPI     Follow-up    Additional comments: Patient seen in ED 03/28/24 for weakness, nausea, vomiting and diarrhea.       Last edited by Lilian Fitzpatrick, CMA on 05/16/2024  1:49 PM.       Discussed the use of AI scribe software for clinical note transcription with the patient, who gave verbal consent to proceed.  History of Present Illness   Elaine Melendez Diane is a 68 year old female who presents for follow-up after an ER visit for vomiting, diarrhea, and hypokalemia.  Six weeks ago, she experienced severe vomiting, diarrhea, and weakness, with hypokalemia noted during an ER visit. Her potassium level was initially low, requiring multiple rounds of IV potassium supplementation, eventually normalizing to 4.3. She has had no further episodes of vomiting or diarrhea.  She developed a red spot on her skin during her hospital stay, possibly related to her nickel allergy. Despite using Neosporin, nystatin  powder, and an anti-itch cream, the spot persists. She has an upcoming dermatology appointment for further evaluation.  She is recovering well from shoulder surgery, having completed physical therapy. She reduced her Effexor  dose by half in June due to feeling fatigued and is stable on the lower dose. She takes vitamin D , vitamin B, ginger, zinc, and turmeric supplements. A high blood pressure reading of 188/unknown was noted in the hospital, but she is not concerned about ER readings.         Medications: Outpatient Medications Prior to Visit  Medication Sig   albuterol  (VENTOLIN  HFA) 108 (90 Base) MCG/ACT inhaler TAKE 2 PUFFS BY MOUTH  EVERY 6 HOURS AS NEEDED FOR WHEEZE OR SHORTNESS OF BREATH   Cyanocobalamin (VITAMIN B12 PO) Take by mouth.   dimenhyDRINATE (DRAMAMINE) 50 MG tablet Take 50 mg by mouth every 8 (eight) hours as needed.   fluticasone  (FLONASE ) 50 MCG/ACT nasal spray SPRAY 2 SPRAYS INTO EACH NOSTRIL EVERY DAY   ondansetron  (ZOFRAN -ODT) 4 MG disintegrating tablet Take 1 tablet (4 mg total) by mouth every 8 (eight) hours as needed for nausea or vomiting.   pantoprazole  (PROTONIX ) 40 MG tablet TAKE 1 TABLET BY MOUTH EVERYDAY AT BEDTIME   potassium chloride  SA (KLOR-CON  M) 20 MEQ tablet TAKE 1 TABLET BY MOUTH EVERY DAY   rosuvastatin  (CRESTOR ) 5 MG tablet Take 1 tablet (5 mg total) by mouth daily.   SUMAtriptan (IMITREX) 50 MG tablet Take 1 tablet (50 mg total) by mouth every 2 (two) hours as needed for migraine.   VITAMIN D  PO Take by mouth daily.   [DISCONTINUED] nystatin  (MYCOSTATIN /NYSTOP ) powder Apply topically 3 (three) times daily.   [DISCONTINUED] venlafaxine  (EFFEXOR ) 75 MG tablet TAKE 1 TABLET BY MOUTH EVERY DAY (Patient taking differently: Take 75 mg by mouth daily. Taking 0.5 tablets daily)   [DISCONTINUED] celecoxib  (CELEBREX ) 200 MG capsule Take 200 mg by mouth 2 (two) times daily as needed. (Patient not taking: Reported on 05/16/2024)   No facility-administered medications prior to visit.    Review of Systems     Objective    BP 130/78 (BP Location: Left Arm, Patient Position: Sitting,  Cuff Size: Large)   Pulse 75   Ht 5' 4 (1.626 m)   Wt 217 lb 12.8 oz (98.8 kg)   LMP  (LMP Unknown)   SpO2 98%   BMI 37.39 kg/m    Physical Exam Vitals reviewed.  Constitutional:      General: She is not in acute distress.    Appearance: She is well-developed.  HENT:     Head: Normocephalic and atraumatic.  Eyes:     General: No scleral icterus.    Conjunctiva/sclera: Conjunctivae normal.  Cardiovascular:     Rate and Rhythm: Normal rate and regular rhythm.  Pulmonary:     Effort: Pulmonary effort  is normal. No respiratory distress.  Skin:    General: Skin is warm and dry.     Findings: Rash (erythematous, maculopapular on sternum) present.  Neurological:     Mental Status: She is alert and oriented to person, place, and time.  Psychiatric:        Behavior: Behavior normal.      No results found for any visits on 05/16/24.  Assessment & Plan     Problem List Items Addressed This Visit       Other   Anxiety and depression   Relevant Medications   venlafaxine  (EFFEXOR ) 37.5 MG tablet   Obesity, morbid (HCC)   Other Visit Diagnoses       Hypokalemia    -  Primary     Irritant contact dermatitis, unspecified trigger               Contact dermatitis Red spot developed during hospital stay, likely due to contact with EKG wires or similar materials. Symptoms include persistent redness despite application of Neosporin, nystatin  powder, and hydrocortisone cream. Suspected allergic reaction, possibly to nickel or other materials used in medical equipment. - Prescribe triamcinolone cream to be applied twice daily until the rash resolves - Follow up with dermatologist if symptoms persist  Essential hypertension Blood pressure was elevated at 188/xx during ER visit, likely due to acute illness. Current blood pressure is 130/78, indicating control. No immediate concerns regarding hypertension management. - Recheck blood pressure during office visit  Depressive disorder, unspecified Effexor  dose was self-adjusted to half due to feeling of dragging. She reports doing well on the reduced dose. Previous attempts to adjust dosage were unsuccessful, but current adjustment is effective. - Update Effexor  prescription to 37.5 mg to match current effective dose  Hypokalemia, resolved Previous episode of hypokalemia associated with vomiting and diarrhea. Potassium levels were critically low at 2.x, requiring multiple rounds of IV potassium. Current potassium level is 4.3, indicating  resolution. Likely caused by acute viral gastroenteritis.        Return for as scheduled.      I personally spent a total of 30 minutes in the care of the patient today including preparing to see the patient, getting/reviewing separately obtained history, performing a medically appropriate exam/evaluation, counseling and educating, documenting clinical information in the EHR, independently interpreting results, communicating results, and coordinating care.    Jon Eva, MD  [facility] [phone] (phone) [phone] (fax)  [facility]

## 2024-06-10 ENCOUNTER — Ambulatory Visit (INDEPENDENT_AMBULATORY_CARE_PROVIDER_SITE_OTHER): Admitting: Dermatology

## 2024-06-10 DIAGNOSIS — L820 Inflamed seborrheic keratosis: Secondary | ICD-10-CM

## 2024-06-10 DIAGNOSIS — Z1283 Encounter for screening for malignant neoplasm of skin: Secondary | ICD-10-CM

## 2024-06-10 DIAGNOSIS — D2239 Melanocytic nevi of other parts of face: Secondary | ICD-10-CM

## 2024-06-10 DIAGNOSIS — L821 Other seborrheic keratosis: Secondary | ICD-10-CM

## 2024-06-10 DIAGNOSIS — W908XXA Exposure to other nonionizing radiation, initial encounter: Secondary | ICD-10-CM

## 2024-06-10 DIAGNOSIS — L814 Other melanin hyperpigmentation: Secondary | ICD-10-CM | POA: Diagnosis not present

## 2024-06-10 DIAGNOSIS — D229 Melanocytic nevi, unspecified: Secondary | ICD-10-CM

## 2024-06-10 DIAGNOSIS — L578 Other skin changes due to chronic exposure to nonionizing radiation: Secondary | ICD-10-CM

## 2024-06-10 DIAGNOSIS — S472XXA Crushing injury of left shoulder and upper arm, initial encounter: Secondary | ICD-10-CM

## 2024-06-10 NOTE — Patient Instructions (Signed)

## 2024-06-10 NOTE — Progress Notes (Signed)
   New Pt Visit   Subjective  Elaine Melendez is a 68 y.o. female who presents for the following: Skin Cancer Screening and Upper Body Skin Exam  The patient presents for Upper Body Skin Exam (UBSE) for skin cancer screening and mole check. The patient has spots, moles and lesions to be evaluated, some may be new or changing and the patient may have concern these could be cancer.  The following portions of the chart were reviewed this encounter and updated as appropriate: medications, allergies, medical history  Review of Systems:  No other skin or systemic complaints except as noted in HPI or Assessment and Plan.  Objective  Well appearing patient in no apparent distress; mood and affect are within normal limits.  All skin waist up examined. Relevant physical exam findings are noted in the Assessment and Plan.  Right abdomen x 2, right inframammary x 15, left inframammary x 21 (38) Stuck-on, waxy, tan-brown papules and plaques -- Discussed benign etiology and prognosis.   Assessment & Plan   INFLAMED SEBORRHEIC KERATOSIS (38) Right abdomen x 2, right inframammary x 15, left inframammary x 21 (38) Symptomatic, irritating, patient would like treated.  Destruction of lesion - Right abdomen x 2, right inframammary x 15, left inframammary x 21 (38) Complexity: simple   Destruction method: cryotherapy   Informed consent: discussed and consent obtained   Timeout:  patient name, date of birth, surgical site, and procedure verified Lesion destroyed using liquid nitrogen: Yes   Region frozen until ice ball extended beyond lesion: Yes   Outcome: patient tolerated procedure well with no complications   Post-procedure details: wound care instructions given    SEBORRHEIC KERATOSIS   ACTINIC SKIN DAMAGE   SKIN CANCER SCREENING   LENTIGO   MELANOCYTIC NEVUS, UNSPECIFIED LOCATION    Skin cancer screening performed today.  Actinic Damage - Chronic condition, secondary to cumulative  UV/sun exposure - diffuse scaly erythematous macules with underlying dyspigmentation - Recommend daily broad spectrum sunscreen SPF 30+ to sun-exposed areas, reapply every 2 hours as needed.  - Staying in the shade or wearing long sleeves, sun glasses (UVA+UVB protection) and wide brim hats (4-inch brim around the entire circumference of the hat) are also recommended for sun protection.  - Call for new or changing lesions.  Lentigines, Seborrheic Keratoses, Hemangiomas - Benign normal skin lesions - Benign-appearing - Call for any changes  Melanocytic Nevi - Tan-brown and/or pink-flesh-colored symmetric macules and papules - Benign appearing on exam today - Observation - Call clinic for new or changing moles - Recommend daily use of broad spectrum spf 30+ sunscreen to sun-exposed areas.   Angiofibroma/Fibrous Papule Right nose x 2 - 1-2 mm smooth symmetric flesh colored to pink papule(s) without features suspicious for malignancy on dermoscopy - Benign-appearing.  Observation.  Call clinic for new or changing lesions.     Left shoulder- pink crust- patient think this area is from  heart monitor leads she had on her chest a few weeks  Recheck at next visit if not gone we will consider biopsy    Return in about 3 months (around 09/10/2024) for recheck left shoulder and ISKS .  IFay Kirks, CMA, am acting as scribe for Alm Rhyme, MD .   Documentation: I have reviewed the above documentation for accuracy and completeness, and I agree with the above.  Alm Rhyme, MD

## 2024-06-17 ENCOUNTER — Other Ambulatory Visit: Payer: Self-pay | Admitting: Gastroenterology

## 2024-06-18 ENCOUNTER — Encounter: Payer: Self-pay | Admitting: Dermatology

## 2024-08-23 ENCOUNTER — Other Ambulatory Visit: Payer: Self-pay | Admitting: *Deleted

## 2024-08-23 DIAGNOSIS — N939 Abnormal uterine and vaginal bleeding, unspecified: Secondary | ICD-10-CM

## 2024-08-26 ENCOUNTER — Encounter: Payer: Self-pay | Admitting: Family Medicine

## 2024-08-27 ENCOUNTER — Ambulatory Visit
Admission: RE | Admit: 2024-08-27 | Discharge: 2024-08-27 | Disposition: A | Discharge status: Home / Self Care | Source: Ambulatory Visit | Attending: Family Medicine | Admitting: Family Medicine

## 2024-08-27 DIAGNOSIS — N939 Abnormal uterine and vaginal bleeding, unspecified: Secondary | ICD-10-CM | POA: Insufficient documentation

## 2024-09-02 ENCOUNTER — Other Ambulatory Visit (HOSPITAL_COMMUNITY)
Admission: RE | Admit: 2024-09-02 | Discharge: 2024-09-02 | Disposition: A | Discharge status: Home / Self Care | Source: Ambulatory Visit | Attending: Family Medicine | Admitting: Family Medicine

## 2024-09-02 ENCOUNTER — Ambulatory Visit: Admitting: Family Medicine

## 2024-09-02 ENCOUNTER — Encounter: Payer: Self-pay | Admitting: Family Medicine

## 2024-09-02 VITALS — BP 178/80 | HR 89

## 2024-09-02 DIAGNOSIS — N950 Postmenopausal bleeding: Secondary | ICD-10-CM | POA: Insufficient documentation

## 2024-09-02 NOTE — Assessment & Plan Note (Signed)
 With significantly thickened lining. S/p biopsy today. Discussed we will call if biopsy is concerning.

## 2024-09-02 NOTE — Progress Notes (Signed)
 Starting in July she had started to have spotting once a month, mostly pink with spot so of blood in the toilet.  Denies any cramping

## 2024-09-02 NOTE — Progress Notes (Signed)
" ° °  Subjective:    Patient ID: Elaine Melendez is a 69 y.o. female presenting with No chief complaint on file.  on 09/02/2024  HPI: Here for episode of postmenopausal bleeding. Monthly since July and spotting with mucous only. Has thickened lining at 2.9 cm. There appears to be small subserosal fibroid as well. Ovaries were not well visualized.  Review of Systems  Constitutional:  Negative for chills and fever.  Respiratory:  Negative for shortness of breath.   Cardiovascular:  Negative for chest pain.  Gastrointestinal:  Negative for abdominal pain, nausea and vomiting.  Genitourinary:  Negative for dysuria.  Skin:  Negative for rash.      Objective:    LMP  (LMP Unknown)  Physical Exam Exam conducted with a chaperone present.  Constitutional:      General: She is not in acute distress.    Appearance: She is well-developed.  HENT:     Head: Normocephalic and atraumatic.  Eyes:     General: No scleral icterus. Cardiovascular:     Rate and Rhythm: Normal rate.  Pulmonary:     Effort: Pulmonary effort is normal.  Abdominal:     Palpations: Abdomen is soft.  Musculoskeletal:     Cervical back: Neck supple.  Skin:    General: Skin is warm and dry.  Neurological:     Mental Status: She is alert and oriented to person, place, and time.   Procedure: Patient given informed consent, signed copy in the chart, time out was performed. Appropriate time out taken. . The patient was placed in the lithotomy position and the cervix brought into view with sterile speculum.  Portio of cervix cleansed x 2 with betadine swabs.  A tenaculum was placed in the anterior lip of the cervix.  The uterus was sounded for depth of 8 cm. A pipelle was introduced to into the uterus, suction created,  and an endometrial sample was obtained. All equipment was removed and accounted for.  The patient tolerated the procedure well.    Assessment & Plan:   Problem List Items Addressed This Visit        Unprioritized   Postmenopausal bleeding - Primary   With significantly thickened lining. S/p biopsy today. Discussed we will call if biopsy is concerning.      Relevant Orders   Surgical pathology( [place]/ [facility])    No follow-ups on file.  Glenys GORMAN Birk, MD 09/02/2024 2:28 PM   "

## 2024-09-06 ENCOUNTER — Ambulatory Visit: Payer: Self-pay | Admitting: Family Medicine

## 2024-09-06 LAB — SURGICAL PATHOLOGY

## 2024-09-10 ENCOUNTER — Ambulatory Visit (INDEPENDENT_AMBULATORY_CARE_PROVIDER_SITE_OTHER): Admitting: Dermatology

## 2024-09-10 ENCOUNTER — Encounter: Admitting: Family Medicine

## 2024-09-10 ENCOUNTER — Encounter: Payer: Self-pay | Admitting: Dermatology

## 2024-09-10 DIAGNOSIS — D235 Other benign neoplasm of skin of trunk: Secondary | ICD-10-CM

## 2024-09-10 DIAGNOSIS — D492 Neoplasm of unspecified behavior of bone, soft tissue, and skin: Secondary | ICD-10-CM | POA: Diagnosis not present

## 2024-09-10 DIAGNOSIS — W908XXA Exposure to other nonionizing radiation, initial encounter: Secondary | ICD-10-CM

## 2024-09-10 DIAGNOSIS — L578 Other skin changes due to chronic exposure to nonionizing radiation: Secondary | ICD-10-CM | POA: Diagnosis not present

## 2024-09-10 DIAGNOSIS — L814 Other melanin hyperpigmentation: Secondary | ICD-10-CM

## 2024-09-10 DIAGNOSIS — L820 Inflamed seborrheic keratosis: Secondary | ICD-10-CM

## 2024-09-10 DIAGNOSIS — L821 Other seborrheic keratosis: Secondary | ICD-10-CM

## 2024-09-10 NOTE — Patient Instructions (Addendum)

## 2024-09-11 LAB — SURGICAL PATHOLOGY

## 2024-09-12 ENCOUNTER — Ambulatory Visit: Payer: Self-pay | Admitting: Dermatology

## 2024-09-12 NOTE — Telephone Encounter (Addendum)
 Called and discussed results with patient. They verbalized understanding denied further questions.      ----- Message from Alm Rhyme, MD sent at 09/12/2024 10:42 AM EST ----- FINAL DIAGNOSIS        1. Skin, left upper back :       DERMATOFIBROMA, BASE INVOLVED, SEE DESCRIPTION   Benign Dermatofibroma. No further treatment needed.  May persist or recur.  DERMATOFIBROMA A dermatofibroma is a benign growth possibly related to trauma, such as an insect bite, cut from shaving, or inflamed acne-type bump.  Treatment options to remove include shave or excision with  resulting scar and risk of recurrence.

## 2024-09-23 ENCOUNTER — Ambulatory Visit: Admitting: Family Medicine

## 2025-06-10 ENCOUNTER — Ambulatory Visit: Admitting: Dermatology
